# Patient Record
Sex: Female | Born: 1955 | Race: Black or African American | Hispanic: No | Marital: Married | Smoking: Never smoker
Health system: Southern US, Community
[De-identification: ages and names within clinical notes are randomized; demographics above are authoritative.]

## PROBLEM LIST (undated history)

## (undated) DIAGNOSIS — I10 Essential (primary) hypertension: Secondary | ICD-10-CM

## (undated) DIAGNOSIS — K5900 Constipation, unspecified: Secondary | ICD-10-CM

## (undated) DIAGNOSIS — K635 Polyp of colon: Secondary | ICD-10-CM

## (undated) DIAGNOSIS — K648 Other hemorrhoids: Secondary | ICD-10-CM

## (undated) DIAGNOSIS — E785 Hyperlipidemia, unspecified: Secondary | ICD-10-CM

## (undated) DIAGNOSIS — Z87898 Personal history of other specified conditions: Secondary | ICD-10-CM

## (undated) DIAGNOSIS — K644 Residual hemorrhoidal skin tags: Secondary | ICD-10-CM

## (undated) DIAGNOSIS — H269 Unspecified cataract: Secondary | ICD-10-CM

## (undated) HISTORY — DX: Residual hemorrhoidal skin tags: K64.4

## (undated) HISTORY — DX: Other hemorrhoids: K64.8

## (undated) HISTORY — DX: Polyp of colon: K63.5

## (undated) HISTORY — DX: Morbid (severe) obesity due to excess calories: E66.01

## (undated) HISTORY — DX: Essential (primary) hypertension: I10

## (undated) HISTORY — DX: Constipation, unspecified: K59.00

## (undated) HISTORY — DX: Personal history of other specified conditions: Z87.898

## (undated) HISTORY — DX: Unspecified cataract: H26.9

## (undated) HISTORY — PX: TRANSPHENOIDAL / TRANSNASAL HYPOPHYSECTOMY / RESECTION PITUITARY TUMOR: SUR1382

## (undated) HISTORY — DX: Hyperlipidemia, unspecified: E78.5

---

## 1998-02-22 ENCOUNTER — Emergency Department (HOSPITAL_COMMUNITY): Admission: EM | Admit: 1998-02-22 | Discharge: 1998-02-22 | Payer: Self-pay | Admitting: Emergency Medicine

## 1998-02-22 ENCOUNTER — Encounter: Payer: Self-pay | Admitting: *Deleted

## 1998-02-26 ENCOUNTER — Emergency Department (HOSPITAL_COMMUNITY): Admission: EM | Admit: 1998-02-26 | Discharge: 1998-02-26 | Payer: Self-pay | Admitting: Emergency Medicine

## 1998-03-01 ENCOUNTER — Emergency Department (HOSPITAL_COMMUNITY): Admission: EM | Admit: 1998-03-01 | Discharge: 1998-03-01 | Payer: Self-pay | Admitting: Emergency Medicine

## 1998-03-04 ENCOUNTER — Emergency Department (HOSPITAL_COMMUNITY): Admission: EM | Admit: 1998-03-04 | Discharge: 1998-03-04 | Payer: Self-pay | Admitting: Emergency Medicine

## 1998-03-16 ENCOUNTER — Encounter: Admission: RE | Admit: 1998-03-16 | Discharge: 1998-03-16 | Payer: Self-pay | Admitting: Internal Medicine

## 1998-05-15 ENCOUNTER — Encounter: Admission: RE | Admit: 1998-05-15 | Discharge: 1998-05-15 | Payer: Self-pay | Admitting: Internal Medicine

## 1998-05-15 ENCOUNTER — Ambulatory Visit (HOSPITAL_COMMUNITY): Admission: RE | Admit: 1998-05-15 | Discharge: 1998-05-15 | Payer: Self-pay | Admitting: Internal Medicine

## 1998-06-22 ENCOUNTER — Emergency Department (HOSPITAL_COMMUNITY): Admission: EM | Admit: 1998-06-22 | Discharge: 1998-06-22 | Payer: Self-pay | Admitting: Emergency Medicine

## 1998-06-22 ENCOUNTER — Encounter: Payer: Self-pay | Admitting: Emergency Medicine

## 1999-03-25 ENCOUNTER — Emergency Department (HOSPITAL_COMMUNITY): Admission: EM | Admit: 1999-03-25 | Discharge: 1999-03-25 | Payer: Self-pay | Admitting: Emergency Medicine

## 1999-04-17 ENCOUNTER — Emergency Department (HOSPITAL_COMMUNITY): Admission: EM | Admit: 1999-04-17 | Discharge: 1999-04-17 | Payer: Self-pay | Admitting: Emergency Medicine

## 2001-09-16 ENCOUNTER — Emergency Department (HOSPITAL_COMMUNITY): Admission: EM | Admit: 2001-09-16 | Discharge: 2001-09-16 | Payer: Self-pay | Admitting: Emergency Medicine

## 2003-04-12 ENCOUNTER — Emergency Department (HOSPITAL_COMMUNITY): Admission: EM | Admit: 2003-04-12 | Discharge: 2003-04-12 | Payer: Self-pay | Admitting: Emergency Medicine

## 2003-04-15 ENCOUNTER — Emergency Department (HOSPITAL_COMMUNITY): Admission: EM | Admit: 2003-04-15 | Discharge: 2003-04-15 | Payer: Self-pay | Admitting: Emergency Medicine

## 2004-08-30 ENCOUNTER — Emergency Department (HOSPITAL_COMMUNITY): Admission: EM | Admit: 2004-08-30 | Discharge: 2004-08-30 | Payer: Self-pay | Admitting: Emergency Medicine

## 2004-12-26 ENCOUNTER — Emergency Department (HOSPITAL_COMMUNITY): Admission: EM | Admit: 2004-12-26 | Discharge: 2004-12-26 | Payer: Self-pay | Admitting: Emergency Medicine

## 2004-12-28 ENCOUNTER — Ambulatory Visit: Payer: Self-pay | Admitting: Internal Medicine

## 2005-01-18 ENCOUNTER — Ambulatory Visit: Payer: Self-pay | Admitting: Internal Medicine

## 2005-01-29 ENCOUNTER — Ambulatory Visit: Payer: Self-pay | Admitting: Internal Medicine

## 2005-02-06 ENCOUNTER — Inpatient Hospital Stay (HOSPITAL_COMMUNITY): Admission: AD | Admit: 2005-02-06 | Discharge: 2005-02-08 | Payer: Self-pay | Admitting: Internal Medicine

## 2005-02-06 ENCOUNTER — Ambulatory Visit: Payer: Self-pay | Admitting: Internal Medicine

## 2005-02-07 ENCOUNTER — Ambulatory Visit: Payer: Self-pay | Admitting: Infectious Diseases

## 2005-02-08 ENCOUNTER — Encounter (INDEPENDENT_AMBULATORY_CARE_PROVIDER_SITE_OTHER): Payer: Self-pay | Admitting: Cardiology

## 2005-02-19 ENCOUNTER — Encounter (INDEPENDENT_AMBULATORY_CARE_PROVIDER_SITE_OTHER): Payer: Self-pay | Admitting: *Deleted

## 2005-02-19 ENCOUNTER — Ambulatory Visit (HOSPITAL_COMMUNITY): Admission: RE | Admit: 2005-02-19 | Discharge: 2005-02-19 | Payer: Self-pay | Admitting: *Deleted

## 2005-04-01 DIAGNOSIS — E119 Type 2 diabetes mellitus without complications: Secondary | ICD-10-CM

## 2005-04-01 HISTORY — DX: Type 2 diabetes mellitus without complications: E11.9

## 2005-04-02 ENCOUNTER — Ambulatory Visit: Payer: Self-pay | Admitting: Internal Medicine

## 2005-07-25 ENCOUNTER — Ambulatory Visit: Payer: Self-pay | Admitting: Internal Medicine

## 2005-07-31 ENCOUNTER — Ambulatory Visit: Payer: Self-pay | Admitting: Internal Medicine

## 2005-08-08 ENCOUNTER — Ambulatory Visit: Payer: Self-pay | Admitting: Internal Medicine

## 2005-08-14 ENCOUNTER — Ambulatory Visit: Payer: Self-pay | Admitting: Internal Medicine

## 2005-08-20 ENCOUNTER — Ambulatory Visit (HOSPITAL_COMMUNITY): Admission: RE | Admit: 2005-08-20 | Discharge: 2005-08-20 | Payer: Self-pay | Admitting: Internal Medicine

## 2005-08-23 ENCOUNTER — Ambulatory Visit: Payer: Self-pay | Admitting: Internal Medicine

## 2005-09-18 ENCOUNTER — Emergency Department (HOSPITAL_COMMUNITY): Admission: EM | Admit: 2005-09-18 | Discharge: 2005-09-18 | Payer: Self-pay | Admitting: Emergency Medicine

## 2005-12-06 ENCOUNTER — Ambulatory Visit: Payer: Self-pay | Admitting: Hospitalist

## 2006-01-14 DIAGNOSIS — E119 Type 2 diabetes mellitus without complications: Secondary | ICD-10-CM | POA: Insufficient documentation

## 2006-01-14 DIAGNOSIS — I10 Essential (primary) hypertension: Secondary | ICD-10-CM | POA: Insufficient documentation

## 2006-04-10 ENCOUNTER — Ambulatory Visit (HOSPITAL_COMMUNITY): Admission: RE | Admit: 2006-04-10 | Discharge: 2006-04-10 | Payer: Self-pay | Admitting: Internal Medicine

## 2006-04-30 ENCOUNTER — Ambulatory Visit: Payer: Self-pay | Admitting: Internal Medicine

## 2006-04-30 ENCOUNTER — Encounter (INDEPENDENT_AMBULATORY_CARE_PROVIDER_SITE_OTHER): Payer: Self-pay | Admitting: *Deleted

## 2006-04-30 LAB — CONVERTED CEMR LAB
CO2: 27 meq/L (ref 19–32)
Chloride: 104 meq/L (ref 96–112)
Creatinine, Ser: 0.7 mg/dL (ref 0.40–1.20)
Potassium: 4.1 meq/L (ref 3.5–5.3)

## 2006-05-07 ENCOUNTER — Ambulatory Visit: Payer: Self-pay | Admitting: Internal Medicine

## 2006-05-15 ENCOUNTER — Encounter (INDEPENDENT_AMBULATORY_CARE_PROVIDER_SITE_OTHER): Payer: Self-pay | Admitting: *Deleted

## 2006-05-15 ENCOUNTER — Ambulatory Visit: Payer: Self-pay | Admitting: Internal Medicine

## 2006-05-15 LAB — CONVERTED CEMR LAB
ALT: 20 units/L (ref 0–35)
AST: 20 units/L (ref 0–37)
Alkaline Phosphatase: 43 units/L (ref 39–117)
CO2: 29 meq/L (ref 19–32)
Chloride: 103 meq/L (ref 96–112)
Creatinine, Ser: 0.61 mg/dL (ref 0.40–1.20)
Glucose, Bld: 117 mg/dL — ABNORMAL HIGH (ref 70–99)
HDL: 51 mg/dL (ref >39)
LDL Cholesterol: 114 mg/dL — ABNORMAL HIGH (ref 0–99)
Potassium: 4.4 meq/L (ref 3.5–5.3)
Sodium: 140 meq/L (ref 135–145)
Total CHOL/HDL Ratio: 3.5
VLDL: 12 mg/dL (ref 0–40)

## 2006-05-16 ENCOUNTER — Encounter (INDEPENDENT_AMBULATORY_CARE_PROVIDER_SITE_OTHER): Payer: Self-pay | Admitting: *Deleted

## 2006-08-12 ENCOUNTER — Encounter (INDEPENDENT_AMBULATORY_CARE_PROVIDER_SITE_OTHER): Payer: Self-pay | Admitting: *Deleted

## 2006-08-12 ENCOUNTER — Ambulatory Visit: Payer: Self-pay | Admitting: Internal Medicine

## 2006-08-12 LAB — CONVERTED CEMR LAB
Calcium: 9.4 mg/dL (ref 8.4–10.5)
Sodium: 140 meq/L (ref 135–145)

## 2006-08-15 ENCOUNTER — Ambulatory Visit: Payer: Self-pay | Admitting: Hospitalist

## 2006-08-15 ENCOUNTER — Encounter (INDEPENDENT_AMBULATORY_CARE_PROVIDER_SITE_OTHER): Payer: Self-pay | Admitting: *Deleted

## 2006-08-15 LAB — CONVERTED CEMR LAB
CO2: 31 meq/L (ref 19–32)
Calcium: 9.3 mg/dL (ref 8.4–10.5)
Chloride: 104 meq/L (ref 96–112)
Potassium: 4 meq/L (ref 3.5–5.3)
Sodium: 141 meq/L (ref 135–145)

## 2006-12-24 ENCOUNTER — Ambulatory Visit: Payer: Self-pay | Admitting: Internal Medicine

## 2006-12-24 ENCOUNTER — Ambulatory Visit (HOSPITAL_COMMUNITY): Admission: RE | Admit: 2006-12-24 | Discharge: 2006-12-24 | Payer: Self-pay | Admitting: Internal Medicine

## 2006-12-24 ENCOUNTER — Encounter (INDEPENDENT_AMBULATORY_CARE_PROVIDER_SITE_OTHER): Payer: Self-pay | Admitting: *Deleted

## 2006-12-24 LAB — CONVERTED CEMR LAB
AST: 24 units/L (ref 0–37)
Albumin: 4.3 g/dL (ref 3.5–5.2)
Alkaline Phosphatase: 45 units/L (ref 39–117)
BUN: 10 mg/dL (ref 6–23)
Creatinine, Urine: 108.2 mg/dL
Microalb Creat Ratio: 2.9 mg/g (ref 0.0–30.0)
Microalb, Ur: 0.31 mg/dL (ref 0.00–1.89)
Potassium: 4.4 meq/L (ref 3.5–5.3)
Sodium: 144 meq/L (ref 135–145)

## 2007-01-26 ENCOUNTER — Encounter (INDEPENDENT_AMBULATORY_CARE_PROVIDER_SITE_OTHER): Payer: Self-pay | Admitting: *Deleted

## 2007-01-26 ENCOUNTER — Ambulatory Visit: Payer: Self-pay | Admitting: Hospitalist

## 2007-02-09 ENCOUNTER — Ambulatory Visit: Payer: Self-pay | Admitting: Internal Medicine

## 2007-02-09 ENCOUNTER — Encounter (INDEPENDENT_AMBULATORY_CARE_PROVIDER_SITE_OTHER): Payer: Self-pay | Admitting: *Deleted

## 2007-05-25 ENCOUNTER — Ambulatory Visit (HOSPITAL_COMMUNITY): Admission: RE | Admit: 2007-05-25 | Discharge: 2007-05-25 | Payer: Self-pay | Admitting: *Deleted

## 2007-06-13 ENCOUNTER — Emergency Department (HOSPITAL_COMMUNITY): Admission: EM | Admit: 2007-06-13 | Discharge: 2007-06-13 | Payer: Self-pay | Admitting: Emergency Medicine

## 2007-07-24 ENCOUNTER — Emergency Department (HOSPITAL_COMMUNITY): Admission: EM | Admit: 2007-07-24 | Discharge: 2007-07-24 | Payer: Self-pay | Admitting: Emergency Medicine

## 2007-10-05 ENCOUNTER — Encounter: Payer: Self-pay | Admitting: Internal Medicine

## 2007-10-05 ENCOUNTER — Ambulatory Visit: Payer: Self-pay | Admitting: *Deleted

## 2007-10-05 ENCOUNTER — Ambulatory Visit (HOSPITAL_COMMUNITY): Admission: RE | Admit: 2007-10-05 | Discharge: 2007-10-05 | Payer: Self-pay | Admitting: *Deleted

## 2007-10-05 DIAGNOSIS — E1169 Type 2 diabetes mellitus with other specified complication: Secondary | ICD-10-CM | POA: Insufficient documentation

## 2007-10-05 DIAGNOSIS — M25519 Pain in unspecified shoulder: Secondary | ICD-10-CM | POA: Insufficient documentation

## 2007-10-05 DIAGNOSIS — E785 Hyperlipidemia, unspecified: Secondary | ICD-10-CM

## 2007-10-05 LAB — CONVERTED CEMR LAB
ALT: 29 units/L (ref 0–35)
CO2: 26 meq/L (ref 19–32)
Calcium: 9.5 mg/dL (ref 8.4–10.5)
Chloride: 102 meq/L (ref 96–112)
Cholesterol: 188 mg/dL (ref 0–200)
Creatinine, Ser: 0.6 mg/dL (ref 0.40–1.20)
Hgb A1c MFr Bld: 6.8 %
Sodium: 141 meq/L (ref 135–145)
Total Protein: 7.4 g/dL (ref 6.0–8.3)

## 2007-11-12 ENCOUNTER — Ambulatory Visit: Payer: Self-pay | Admitting: *Deleted

## 2007-12-01 DIAGNOSIS — Z860100 Personal history of colon polyps, unspecified: Secondary | ICD-10-CM

## 2007-12-01 HISTORY — DX: Personal history of colonic polyps: Z86.010

## 2007-12-01 HISTORY — DX: Personal history of colon polyps, unspecified: Z86.0100

## 2007-12-14 ENCOUNTER — Ambulatory Visit: Payer: Self-pay | Admitting: Gastroenterology

## 2007-12-23 ENCOUNTER — Encounter: Payer: Self-pay | Admitting: Gastroenterology

## 2007-12-23 ENCOUNTER — Ambulatory Visit: Payer: Self-pay | Admitting: Gastroenterology

## 2007-12-23 DIAGNOSIS — K644 Residual hemorrhoidal skin tags: Secondary | ICD-10-CM

## 2007-12-23 HISTORY — PX: COLONOSCOPY: SHX174

## 2007-12-23 HISTORY — DX: Residual hemorrhoidal skin tags: K64.4

## 2007-12-24 ENCOUNTER — Encounter: Payer: Self-pay | Admitting: Gastroenterology

## 2008-04-20 ENCOUNTER — Ambulatory Visit: Payer: Self-pay | Admitting: Internal Medicine

## 2008-04-20 ENCOUNTER — Encounter (INDEPENDENT_AMBULATORY_CARE_PROVIDER_SITE_OTHER): Payer: Self-pay | Admitting: Internal Medicine

## 2008-04-20 ENCOUNTER — Inpatient Hospital Stay (HOSPITAL_COMMUNITY): Admission: AD | Admit: 2008-04-20 | Discharge: 2008-04-22 | Payer: Self-pay | Admitting: Infectious Diseases

## 2008-04-20 ENCOUNTER — Encounter (INDEPENDENT_AMBULATORY_CARE_PROVIDER_SITE_OTHER): Payer: Self-pay | Admitting: *Deleted

## 2008-04-20 ENCOUNTER — Ambulatory Visit: Payer: Self-pay | Admitting: Infectious Diseases

## 2008-04-20 LAB — CONVERTED CEMR LAB
Blood Glucose, Fingerstick: 130
Hgb A1c MFr Bld: 6.5 %

## 2008-04-21 ENCOUNTER — Encounter: Payer: Self-pay | Admitting: *Deleted

## 2008-04-21 LAB — CONVERTED CEMR LAB
Cholesterol: 160 mg/dL
HDL: 45 mg/dL
Triglycerides: 71 mg/dL

## 2008-04-22 ENCOUNTER — Encounter (INDEPENDENT_AMBULATORY_CARE_PROVIDER_SITE_OTHER): Payer: Self-pay | Admitting: Internal Medicine

## 2008-05-16 ENCOUNTER — Encounter: Payer: Self-pay | Admitting: *Deleted

## 2008-05-16 ENCOUNTER — Ambulatory Visit: Payer: Self-pay | Admitting: Internal Medicine

## 2008-05-26 ENCOUNTER — Ambulatory Visit (HOSPITAL_COMMUNITY): Admission: RE | Admit: 2008-05-26 | Discharge: 2008-05-26 | Payer: Self-pay | Admitting: *Deleted

## 2008-06-17 ENCOUNTER — Ambulatory Visit: Payer: Self-pay | Admitting: Internal Medicine

## 2008-07-14 ENCOUNTER — Ambulatory Visit: Payer: Self-pay | Admitting: Internal Medicine

## 2008-07-14 DIAGNOSIS — M79609 Pain in unspecified limb: Secondary | ICD-10-CM | POA: Insufficient documentation

## 2008-07-18 ENCOUNTER — Ambulatory Visit (HOSPITAL_COMMUNITY): Admission: RE | Admit: 2008-07-18 | Discharge: 2008-07-18 | Payer: Self-pay | Admitting: *Deleted

## 2008-07-20 ENCOUNTER — Telehealth: Payer: Self-pay | Admitting: *Deleted

## 2008-07-26 DIAGNOSIS — M1990 Unspecified osteoarthritis, unspecified site: Secondary | ICD-10-CM

## 2008-07-26 DIAGNOSIS — M1712 Unilateral primary osteoarthritis, left knee: Secondary | ICD-10-CM | POA: Insufficient documentation

## 2008-07-27 ENCOUNTER — Encounter (INDEPENDENT_AMBULATORY_CARE_PROVIDER_SITE_OTHER): Payer: Self-pay | Admitting: *Deleted

## 2008-08-01 ENCOUNTER — Ambulatory Visit: Payer: Self-pay | Admitting: Family Medicine

## 2008-11-23 ENCOUNTER — Encounter: Payer: Self-pay | Admitting: Internal Medicine

## 2009-03-15 ENCOUNTER — Emergency Department (HOSPITAL_COMMUNITY): Admission: EM | Admit: 2009-03-15 | Discharge: 2009-03-15 | Payer: Self-pay | Admitting: Emergency Medicine

## 2009-04-03 ENCOUNTER — Emergency Department (HOSPITAL_COMMUNITY): Admission: EM | Admit: 2009-04-03 | Discharge: 2009-04-03 | Payer: Self-pay | Admitting: Emergency Medicine

## 2009-04-14 ENCOUNTER — Ambulatory Visit: Payer: Self-pay | Admitting: Internal Medicine

## 2009-04-14 ENCOUNTER — Telehealth: Payer: Self-pay | Admitting: *Deleted

## 2009-04-14 DIAGNOSIS — R519 Headache, unspecified: Secondary | ICD-10-CM | POA: Insufficient documentation

## 2009-04-14 DIAGNOSIS — R7401 Elevation of levels of liver transaminase levels: Secondary | ICD-10-CM | POA: Insufficient documentation

## 2009-04-14 DIAGNOSIS — R51 Headache: Secondary | ICD-10-CM | POA: Insufficient documentation

## 2009-04-14 DIAGNOSIS — R740 Nonspecific elevation of levels of transaminase and lactic acid dehydrogenase [LDH]: Secondary | ICD-10-CM

## 2009-04-14 LAB — CONVERTED CEMR LAB
ALT: 20 units/L (ref 0–35)
AST: 22 units/L (ref 0–37)
Blood Glucose, Fingerstick: 110
Cholesterol: 180 mg/dL (ref 0–200)
Creatinine, Ser: 0.62 mg/dL (ref 0.40–1.20)
Creatinine, Urine: 61.6 mg/dL
Hgb A1c MFr Bld: 6.4 %
Microalb Creat Ratio: 8.1 mg/g (ref 0.0–30.0)
Total Bilirubin: 0.4 mg/dL (ref 0.3–1.2)
Total CHOL/HDL Ratio: 3.2
VLDL: 19 mg/dL (ref 0–40)

## 2009-04-15 ENCOUNTER — Encounter (INDEPENDENT_AMBULATORY_CARE_PROVIDER_SITE_OTHER): Payer: Self-pay | Admitting: Dermatology

## 2009-04-16 LAB — CONVERTED CEMR LAB: Prolactin: 4.6 ng/mL

## 2009-04-20 ENCOUNTER — Encounter (INDEPENDENT_AMBULATORY_CARE_PROVIDER_SITE_OTHER): Payer: Self-pay | Admitting: Internal Medicine

## 2009-04-26 ENCOUNTER — Ambulatory Visit: Payer: Self-pay | Admitting: Internal Medicine

## 2009-05-08 ENCOUNTER — Encounter (INDEPENDENT_AMBULATORY_CARE_PROVIDER_SITE_OTHER): Payer: Self-pay | Admitting: Internal Medicine

## 2009-05-16 ENCOUNTER — Telehealth: Payer: Self-pay | Admitting: *Deleted

## 2009-05-29 ENCOUNTER — Ambulatory Visit (HOSPITAL_COMMUNITY): Admission: RE | Admit: 2009-05-29 | Discharge: 2009-05-29 | Payer: Self-pay | Admitting: Dermatology

## 2009-06-27 ENCOUNTER — Telehealth (INDEPENDENT_AMBULATORY_CARE_PROVIDER_SITE_OTHER): Payer: Self-pay | Admitting: Dermatology

## 2009-07-18 ENCOUNTER — Ambulatory Visit: Payer: Self-pay | Admitting: Internal Medicine

## 2009-07-18 DIAGNOSIS — F329 Major depressive disorder, single episode, unspecified: Secondary | ICD-10-CM | POA: Insufficient documentation

## 2009-07-18 DIAGNOSIS — F3289 Other specified depressive episodes: Secondary | ICD-10-CM | POA: Insufficient documentation

## 2009-07-18 LAB — CONVERTED CEMR LAB: Hgb A1c MFr Bld: 6.3 %

## 2009-07-24 ENCOUNTER — Ambulatory Visit: Payer: Self-pay | Admitting: Sports Medicine

## 2009-08-08 ENCOUNTER — Encounter (INDEPENDENT_AMBULATORY_CARE_PROVIDER_SITE_OTHER): Payer: Self-pay | Admitting: Internal Medicine

## 2009-10-09 ENCOUNTER — Ambulatory Visit: Payer: Self-pay | Admitting: Internal Medicine

## 2009-10-09 DIAGNOSIS — R350 Frequency of micturition: Secondary | ICD-10-CM | POA: Insufficient documentation

## 2009-10-09 LAB — CONVERTED CEMR LAB
Alkaline Phosphatase: 46 units/L (ref 39–117)
BUN: 11 mg/dL (ref 6–23)
Bacteria, UA: NONE SEEN
Barbiturate Quant, Ur: NEGATIVE
Bilirubin Urine: NEGATIVE
Blood in Urine, dipstick: NEGATIVE
CO2: 29 meq/L (ref 19–32)
Cholesterol: 148 mg/dL (ref 0–200)
Cocaine Metabolites: NEGATIVE
Creatinine, Ser: 0.84 mg/dL (ref 0.40–1.20)
Creatinine,U: 146.5 mg/dL
Crystals: NONE SEEN
Eosinophils Absolute: 0.1 10*3/uL (ref 0.0–0.7)
Eosinophils Relative: 1 % (ref 0–5)
Glucose, Bld: 112 mg/dL — ABNORMAL HIGH (ref 70–99)
Glucose, Urine, Semiquant: NEGATIVE
HCT: 42.4 % (ref 36.0–46.0)
HDL: 54 mg/dL (ref >39)
Hemoglobin: 13.5 g/dL (ref 12.0–15.0)
Ketones, ur: NEGATIVE mg/dL
Ketones, urine, test strip: NEGATIVE
LDL Cholesterol: 75 mg/dL (ref 0–99)
Lymphs Abs: 3.2 10*3/uL (ref 0.7–4.0)
MCV: 94.2 fL (ref >=78.0)
Monocytes Absolute: 0.6 10*3/uL (ref 0.1–1.0)
Monocytes Relative: 6 % (ref 3–12)
Opiates: NEGATIVE
Phencyclidine (PCP): NEGATIVE
Platelets: 320 10*3/uL (ref 150–400)
Propoxyphene: NEGATIVE
Protein, U semiquant: NEGATIVE
Protein, ur: NEGATIVE mg/dL
RBC / HPF: NONE SEEN (ref <3)
Squamous Epithelial / LPF: NONE SEEN /lpf
Total Bilirubin: 0.3 mg/dL (ref 0.3–1.2)
Triglycerides: 94 mg/dL (ref <150)
Urine Glucose: NEGATIVE mg/dL
Urobilinogen, UA: 0.2 (ref 0.0–1.0)
VLDL: 19 mg/dL (ref 0–40)
WBC, UA: NONE SEEN cells/hpf (ref <3)
WBC: 10.1 10*3/uL (ref 4.0–10.5)

## 2009-11-20 ENCOUNTER — Encounter: Payer: Self-pay | Admitting: Internal Medicine

## 2009-11-20 ENCOUNTER — Ambulatory Visit: Payer: Self-pay | Admitting: Internal Medicine

## 2009-11-20 LAB — CONVERTED CEMR LAB: Blood Glucose, Fingerstick: 132

## 2009-11-21 ENCOUNTER — Ambulatory Visit (HOSPITAL_COMMUNITY): Admission: RE | Admit: 2009-11-21 | Discharge: 2009-11-21 | Payer: Self-pay | Admitting: Internal Medicine

## 2009-11-24 ENCOUNTER — Ambulatory Visit: Payer: Self-pay | Admitting: Family Medicine

## 2009-12-22 ENCOUNTER — Encounter: Payer: Self-pay | Admitting: Internal Medicine

## 2009-12-22 ENCOUNTER — Observation Stay (HOSPITAL_COMMUNITY): Admission: EM | Admit: 2009-12-22 | Discharge: 2009-12-25 | Payer: Self-pay | Admitting: Emergency Medicine

## 2009-12-22 ENCOUNTER — Ambulatory Visit: Payer: Self-pay | Admitting: Internal Medicine

## 2009-12-25 ENCOUNTER — Encounter: Payer: Self-pay | Admitting: Internal Medicine

## 2009-12-25 DIAGNOSIS — J3489 Other specified disorders of nose and nasal sinuses: Secondary | ICD-10-CM | POA: Insufficient documentation

## 2009-12-26 ENCOUNTER — Emergency Department (HOSPITAL_COMMUNITY): Admission: EM | Admit: 2009-12-26 | Discharge: 2009-12-27 | Payer: Self-pay | Admitting: Emergency Medicine

## 2009-12-29 ENCOUNTER — Emergency Department (HOSPITAL_COMMUNITY): Admission: EM | Admit: 2009-12-29 | Discharge: 2009-12-29 | Payer: Self-pay | Admitting: Emergency Medicine

## 2010-03-07 ENCOUNTER — Ambulatory Visit: Payer: Self-pay | Admitting: Internal Medicine

## 2010-04-10 ENCOUNTER — Emergency Department (HOSPITAL_COMMUNITY)
Admission: EM | Admit: 2010-04-10 | Discharge: 2010-04-10 | Payer: Self-pay | Source: Home / Self Care | Admitting: Emergency Medicine

## 2010-04-12 ENCOUNTER — Ambulatory Visit
Admission: RE | Admit: 2010-04-12 | Discharge: 2010-04-12 | Payer: Self-pay | Source: Home / Self Care | Attending: Internal Medicine | Admitting: Internal Medicine

## 2010-04-12 ENCOUNTER — Telehealth: Payer: Self-pay | Admitting: *Deleted

## 2010-04-12 DIAGNOSIS — J069 Acute upper respiratory infection, unspecified: Secondary | ICD-10-CM | POA: Insufficient documentation

## 2010-04-12 LAB — CONVERTED CEMR LAB: Blood Glucose, Fingerstick: 169

## 2010-04-16 LAB — GLUCOSE, CAPILLARY: Glucose-Capillary: 169 mg/dL — ABNORMAL HIGH (ref 70–99)

## 2010-04-30 ENCOUNTER — Ambulatory Visit: Admission: RE | Admit: 2010-04-30 | Discharge: 2010-04-30 | Payer: Self-pay | Source: Home / Self Care

## 2010-04-30 ENCOUNTER — Other Ambulatory Visit: Payer: Self-pay | Admitting: Internal Medicine

## 2010-04-30 DIAGNOSIS — R1904 Left lower quadrant abdominal swelling, mass and lump: Secondary | ICD-10-CM | POA: Insufficient documentation

## 2010-04-30 LAB — CONVERTED CEMR LAB
Blood Glucose, Fingerstick: 170
Calcium: 10.1 mg/dL (ref 8.4–10.5)
Cholesterol, target level: 200 mg/dL
HDL goal, serum: 40 mg/dL
Potassium: 4.1 meq/L (ref 3.5–5.3)
Sodium: 140 meq/L (ref 135–145)

## 2010-04-30 LAB — GLUCOSE, CAPILLARY: Glucose-Capillary: 170 mg/dL — ABNORMAL HIGH (ref 70–99)

## 2010-05-01 NOTE — Miscellaneous (Signed)
Summary: [facility]  [facility]   Imported By: Florinda Marker 05/09/2009 13:56:52  _____________________________________________________________________  External Attachment:    Type:   Image     Comment:   External Document

## 2010-05-01 NOTE — Miscellaneous (Signed)
Summary: MEDICATION CONTRACT  MEDICATION CONTRACT   Imported By: Louretta Parma 11/24/2009 15:03:32  _____________________________________________________________________  External Attachment:    Type:   Image     Comment:   External Document

## 2010-05-01 NOTE — Progress Notes (Signed)
  Phone Note Outgoing Call   Call placed by: Theotis Barrio NT II,  May 16, 2009 12:08 PM Call placed to: Patient Details for Reason: MAMMOGRAM APPT Summary of Call: [facility]  / FEB.  28, 011 @ 11:20AM (11:00AM) . SPOKE WITH MS [place].Lela Sturdivant NT II  May 16, 2009 12:10 PM

## 2010-05-01 NOTE — Assessment & Plan Note (Signed)
Summary: L KNEE PAIN, ANKLE PAIN,MC   Vital Signs:  Patient profile:   55 year old female BP sitting:   144 / 104  Vitals Entered By: Lillia Pauls CMA (November 24, 2009 9:54 AM)  Primary Care Provider:  Almyra Deforest MD   History of Present Illness: f/u left knee and left ankle pain Injection in her knee helped for only 2 weeks. has had pain since that is unchanged. (Injection in April). No new symptoms, no new injury. Constinues with chronic left ankle pain and swelling as well. Painful to do much walking.  Current Medications (verified): 1)  Glucophage 500 Mg  Tabs (Metformin Hcl) .... Take 1 Tablet By Mouth Two Times A Day 2)  Addaprin 200 Mg  Tabs (Ibuprofen) .... Take One Tablet 3-4 Times Per Day As Needed For Pain 3)  Simvastatin 40 Mg Tabs (Simvastatin) .... Take 1 Tab By Mouth At Bedtime 4)  Lopressor 50 Mg Tabs (Metoprolol Tartrate) .... Take 1 Tablet By Mouth Two Times A Day 5)  Hydrocodone-Acetaminophen 5-500 Mg Tabs (Hydrocodone-Acetaminophen) .... Take 1 Tablet By Mouth Every 6 Hours As Needed For Pain 6)  Accuretic 20-25 Mg Tabs (Quinapril-Hydrochlorothiazide) .... Take 1 Tablet By Mouth Once A Day 7)  Aspirin 81 Mg Tbec (Aspirin) .... Take 1 Tablet By Mouth Once A Day 8)  Colace 100 Mg Caps (Docusate Sodium) .... Take One Tablet By Mouth Once A Day Prn 9)  Anusol-Hc 2.5 % Crea (Hydrocortisone) .Marland Kitchen.. 1 Applicator Full Pr Bid For 1 Week and Then Stop  Allergies: No Known Drug Allergies  Review of Systems  The patient denies fever.    Physical Exam  General:  alert, well-developed, well-nourished, well-hydrated, and overweight-appearing.   Msk:  Left knee some synovial changes c/w OA. Mild crepitus on extension. Full extension and flexion. Ligamentously intact. No effusion. Patella mild lateral traking  L ankle--mild non pitting soft  tissue swelling esp over lateral ATF area. FROm.   GAIT somewhat antalgic   Impression & Recommendations:  Problem # 1:  KNEE  PAIN (ICD-719.46) injection therapy did not help[ her we discussed weight loss---not really  ready to pursue HEP. will f/u as needed--as injection did not help, I do not think we have much else to offer her for her knee pain. She is a bot young to consider TKR and her last x rays showed some joint space retained. A good over all rehab p[rogram might be beneficial, but she does not seem ready to undertake that.  Problem # 2:  FOOT PAIN, LEFT (ICD-729.5) foot and ankle pain see above re rehab might benefit from mild - moderate support hose to keep her ST swelling minimized and s we discussed  Complete Medication List: 1)  Glucophage 500 Mg Tabs (Metformin hcl) .... Take 1 tablet by mouth two times a day 2)  Addaprin 200 Mg Tabs (Ibuprofen) .... Take one tablet 3-4 times per day as needed for pain 3)  Simvastatin 40 Mg Tabs (Simvastatin) .... Take 1 tab by mouth at bedtime 4)  Lopressor 50 Mg Tabs (Metoprolol tartrate) .... Take 1 tablet by mouth two times a day 5)  Hydrocodone-acetaminophen 5-500 Mg Tabs (Hydrocodone-acetaminophen) .... Take 1 tablet by mouth every 6 hours as needed for pain 6)  Accuretic 20-25 Mg Tabs (Quinapril-hydrochlorothiazide) .... Take 1 tablet by mouth once a day 7)  Aspirin 81 Mg Tbec (Aspirin) .... Take 1 tablet by mouth once a day 8)  Colace 100 Mg Caps (Docusate sodium) .... Take  one tablet by mouth once a day prn 9)  Anusol-hc 2.5 % Crea (Hydrocortisone) .Marland Kitchen.. 1 applicator full pr bid for 1 week and then stop

## 2010-05-01 NOTE — Assessment & Plan Note (Signed)
Summary: RECHECK BP. SB.   Vital Signs:  Patient profile:   55 year old female Height:      62 inches (157.48 cm) Weight:      275.8 pounds (125.36 kg) BMI:     50.63 Pulse rate:   73 / minute BP sitting:   134 / 87  (right arm) Cuff size:   large  Vitals Entered By: Krystal Eaton Duncan Dull) (April 26, 2009 9:13 AM) CC: 2wk f/u bp ck (has not had meds this am) amlodipine was increased to 10mg  daily on 04/12/09, but pt has not picked up rx Is Patient Diabetic? Yes Did you bring your meter with you today? does not have one Pain Assessment Patient in pain? yes     Location: dental Intensity: 10 Type: sharp Onset of pain  Intermittent x Nutritional Status BMI of > 30 = obese  Have you ever been in a relationship where you felt threatened, hurt or afraid?No   Does patient need assistance? Functional Status Self care Ambulation Normal   Primary Care Provider:  Nilda Riggs MD  CC:  2wk f/u bp ck (has not had meds this am) amlodipine was increased to 10mg  daily on 04/12/09 and but pt has not picked up rx.  History of Present Illness: Pt is a 55 yo AAF with PMH of DM, HTN, HLD, Obesity and OA who came here for regular BP F/u. She has no c/o including CP, SOB, fever except both knee pain and foot pain which is f/u by Sports med Dr. Jennette Kettle. She has been taking all her meds, but not take them this morning. Her CBG runs well and has fair appetite, no diarrhea or melena No dysuria. Denies smoking, ETOH or drug abuse.    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 Take 1 Tablet By Mouth Two Times A Day 2)  Lisinopril 40 Mg  Tabs (Lisinopril) .... Take 1 Tablet By Mouth Once A Day 3)  Addaprin 200 Mg  Tabs (Ibuprofen) .... Take One Tablet 3-4 Times Per Day As Needed For Pain 4)  Simvastatin 40 Mg Tabs (Simvastatin) .... Take 1 Tab By Mouth At Bedtime 5)  Onetouch Test   Strp (Glucose Blood) .... Use To Check Your Blood Sugar Once Daily. 6)  Lopressor 50 Mg Tabs (Metoprolol Tartrate) .... Take 1 Tablet By Mouth Two Times A Day 7)  Amlodipine Besylate 10 Mg Tabs (Amlodipine Besylate) .... Take 1 Tablet By Mouth Once A Day  Current Medications (verified): 1)  Glucophage 500 Mg  Tabs (Metformin Hcl) .... Take 1 Tablet By Mouth Two Times A Day 2)  Lisinopril 40 Mg  Tabs (Lisinopril) .... Take 1 Tablet By Mouth Once A Day 3)  Addaprin 200 Mg  Tabs (Ibuprofen) .Marland KitchenMarland KitchenMarland Kitchen  Take One Tablet 3-4 Times Per Day As Needed For Pain 4)  Simvastatin 40 Mg Tabs (Simvastatin) .... Take 1 Tab By Mouth At Bedtime 5)  Onetouch Test   Strp (Glucose Blood) .... Use To Check Your Blood Sugar Once Daily. 6)  Lopressor 50 Mg Tabs (Metoprolol Tartrate) .... Take 1 Tablet By Mouth Two Times A Day 7)  Amlodipine Besylate 10 Mg Tabs (Amlodipine Besylate) .... Take 1 Tablet By Mouth Once A Day  Allergies (verified): No Known Drug Allergies  Past History:  Past Medical History: Last updated: 07/14/2008 DIABETES MELLITUS, TYPE II HYPERTENSION HYPERLIPIDEMIA OBESITY FATTY LIVER DISEASE L KNEE CPPD with patellar/quad tendon enthesopathy    -  as evidenced by xray 9/08    - attempted joint space aspiration/steroid injection unsuccessful by Dr. Laveda Abbe 01/2007 COLON  HYPERPLASTIC POLYP, 12/24/07 (Dr. Christella Hartigan) NON CARDIAC CHEST PAIN, 04/2008    - Normal cath, EF 60%  Past Surgical History: Last updated: 07/14/2008 s/p pituitary tumor resection  Family History: Last updated: 04/20/2008 Mother: stroke at 16 Father: heart attack at 109 Has an aunt that died of colon CA (late 50's-early 6's).  Sister had MI at 47.  Social History: Last updated: 07/14/2008 Never Smoked. Lives at home with son, currently unemployed. Is separated from husband - still sexually active with him.  Risk Factors: Alcohol Use: 0 (04/26/2009) Exercise: yes (04/14/2009)  Risk Factors: Smoking Status: never (04/26/2009)  Family History: Reviewed history from 04/20/2008 and no changes required. Mother: stroke at 29 Father: heart attack at 19 Has an aunt that died of colon CA (late 50's-early 67's).  Sister had MI at 64.  Social History: Reviewed history from 07/14/2008 and no changes required. Never Smoked. Lives at home with son, currently unemployed. Is separated from husband - still sexually active with him.  Review of Systems  The patient denies fever, chest pain, syncope, dyspnea on exertion, peripheral edema, prolonged cough, hemoptysis, abdominal pain, melena, and transient blindness.    Physical Exam  General:  alert, well-developed, well-nourished, well-hydrated, and overweight-appearing.   Head:  normocephalic.   Eyes:  vision grossly intact.   Ears:  no external deformities.   Nose:  no external erythema and no nasal discharge.   Mouth:  pharynx pink and moist.   Neck:  supple.   Lungs:  normal respiratory effort, normal breath sounds, no crackles, and no wheezes.   Heart:  normal rate, regular rhythm, no murmur, no gallop, no rub, and no JVD.   Abdomen:  soft, non-tender, normal bowel sounds, no distention, no masses, and no guarding.   Msk:  Both knee has very mild tenderness, no joint swelling, no joint warmth, and no redness over joints.   Pulses:   2+ Extremities:  No edema. Neurologic:  alert & oriented X3, cranial nerves II-XII intact, strength normal in all extremities, sensation intact to pinprick, and gait normal.     Impression & Recommendations:  Problem # 1:  DIABETES MELLITUS, TYPE II (ICD-250.00) Assessment Unchanged A1C at the goal and will continue the current meds and dose. Will have foot exam which is due.  Her updated medication list for this problem includes:    Glucophage 500 Mg Tabs (Metformin hcl) .Marland Kitchen... Take 1 tablet by mouth two times a day    Lisinopril 40 Mg Tabs (Lisinopril) .Marland Kitchen... Take 1 tablet by mouth once a day  Labs Reviewed: Creat: 0.62 (04/14/2009)     Last Eye Exam: No diabetic retinopathy.   Visual acuity OD (best corrected):  20/50 Visual acuity OS (best corrected): 20/30  (07/27/2008) Reviewed HgBA1c results: 6.4 (04/14/2009)  6.9 (11/23/2008)  Problem # 2:  HYPERTENSION (ICD-401.9) Assessment: Improved Her BP is much better even she did not take today's med. She has been taking all her meds as instructed, so will not change her meds. Will f/u at next visit in 3-4 months.  Her updated medication list for this problem includes:    Lisinopril 40 Mg Tabs (Lisinopril) .Marland Kitchen... Take 1 tablet by mouth once a day    Lopressor 50 Mg Tabs (Metoprolol tartrate) .Marland Kitchen... Take 1 tablet by mouth two times a day    Amlodipine Besylate 10 Mg Tabs (Amlodipine besylate) .Marland Kitchen... Take 1 tablet by mouth once a day  BP today: 134/87 Prior BP: 146/101 (04/14/2009)  Labs Reviewed: K+: 4.7 (04/14/2009) Creat: : 0.62 (04/14/2009)   Chol: 180 (04/14/2009)   HDL: 56 (04/14/2009)   LDL: 105 (04/14/2009)   TG: 95 (04/14/2009)  Problem # 3:  HYPERLIPIDEMIA (ICD-272.4) Assessment: Unchanged Her LDL is almost at the goal and advised lose weight, exercise and healthy diet. If not improve, may increase pravastatin to 80 mg.  Her updated medication list for this problem includes:    Simvastatin 40 Mg Tabs (Simvastatin) .Marland Kitchen...  Take 1 tab by mouth at bedtime  Labs Reviewed: SGOT: 22 (04/14/2009)   SGPT: 20 (04/14/2009)   HDL:56 (04/14/2009), 45 (04/21/2008)  LDL:105 (04/14/2009), 101 (04/21/2008)  Chol:180 (04/14/2009), 160 (04/21/2008)  Trig:95 (04/14/2009), 71 (04/21/2008)  Problem # 4:  OSTEOARTHRITIS (ICD-715.90) Assessment: Unchanged She still has pain in her knee and responds to ibuprofen and has been f/u by sports med Dr. Jennette Kettle. Her updated medication list for this problem includes:    Addaprin 200 Mg Tabs (Ibuprofen) .Marland Kitchen... Take one tablet 3-4 times per day as needed for pain  Problem # 5:  OBESITY NOS (ICD-278.00) Assessment: Unchanged She is morbid obesity and advised weight loss, exercise and healthy diet. She said she will try her best to do it. Ht: 62 (04/26/2009)   Wt: 275.8 (04/26/2009)   BMI: 50.63 (04/26/2009)  Complete Medication List: 1)  Glucophage 500 Mg Tabs (Metformin hcl) .... Take 1 tablet by mouth two times a day 2)  Lisinopril 40 Mg Tabs (Lisinopril) .... Take 1 tablet by mouth once a day 3)  Addaprin 200 Mg Tabs (Ibuprofen) .... Take one tablet 3-4 times per day as needed for pain 4)  Simvastatin 40 Mg Tabs (Simvastatin) .... Take 1 tab by mouth at bedtime 5)  Onetouch Test Strp (Glucose blood) .... Use to check your blood sugar once daily. 6)  Lopressor 50 Mg Tabs (Metoprolol tartrate) .... Take 1 tablet by mouth two times a day 7)  Amlodipine Besylate 10 Mg Tabs (Amlodipine besylate) .... Take 1 tablet by mouth once a day  Patient Instructions: 1)  Please schedule a follow-up appointment in 3-4 months. 2)  You need to lose weight. Consider a lower calorie diet and regular exercise.    Prevention & Chronic Care Immunizations   Influenza vaccine: Not documented   Influenza vaccine deferral: Refused  (04/14/2009)    Tetanus booster: Not documented   Td booster deferral: Refused  (04/14/2009)    Pneumococcal vaccine: Not documented   Pneumococcal vaccine deferral: Refused   (04/14/2009)  Colorectal Screening   Hemoccult: Not documented   Hemoccult action/deferral: Deferred  (04/14/2009)    Colonoscopy: Location:  [facility].    (12/23/2007)   Colonoscopy action/deferral: patient refused  (05/07/2006)   Colonoscopy  due: 11/2017  Other Screening   Pap smear: Not documented   Pap smear action/deferral: Refused  (04/26/2009)    Mammogram: Not documented   Mammogram action/deferral: Ordered  (04/14/2009)   Smoking status: never  (04/26/2009)  Diabetes Mellitus   HgbA1C: 6.4  (04/14/2009)    Eye exam: No diabetic retinopathy.   Visual acuity OD (best corrected): 20/50 Visual acuity OS (best corrected): 20/30   (07/27/2008)    Foot exam: Not documented   Foot exam action/deferral: Do today   High risk foot: Not documented   Foot care education: Done  (11/23/2008)    Urine microalbumin/creatinine ratio: 8.1  (04/14/2009)    Diabetes flowsheet reviewed?: Yes   Progress toward A1C goal: At goal  Lipids   Total Cholesterol: 180  (04/14/2009)   LDL: 105  (04/14/2009)   LDL Direct: Not documented   HDL: 56  (04/14/2009)   Triglycerides: 95  (04/14/2009)    SGOT (AST): 22  (04/14/2009)   SGPT (ALT): 20  (04/14/2009)   Alkaline phosphatase: 42  (04/14/2009)   Total bilirubin: 0.4  (04/14/2009)    Lipid flowsheet reviewed?: Yes   Progress toward LDL goal: At goal  Hypertension   Last Blood Pressure: 134 / 87  (04/26/2009)   Serum creatinine: 0.62  (04/14/2009)   Serum potassium 4.7  (04/14/2009)    Hypertension flowsheet reviewed?: Yes   Progress toward BP goal: At goal  Self-Management Support :   Personal Goals (by the next clinic visit) :     Personal A1C goal: 7  (04/26/2009)     Personal blood pressure goal: 130/80  (04/26/2009)     Personal LDL goal: 100  (04/26/2009)    Patient will work on the following items until the next clinic visit to reach self-care goals:     Medications and monitoring: take my medicines  every day, check my blood sugar, examine my feet every day  (04/26/2009)     Eating: drink diet soda or water instead of juice or soda, eat more vegetables, eat foods that are low in salt  (04/26/2009)     Activity: take a 30 minute walk every day  (04/14/2009)     Other: walk to mail box or store once a week  (11/23/2008)    Diabetes self-management support: Written self-care plan  (04/26/2009)   Diabetes care plan printed    Hypertension self-management support: Written self-care plan  (04/26/2009)   Hypertension self-care plan printed.    Lipid self-management support: Written self-care plan  (04/26/2009)   Lipid self-care plan printed.

## 2010-05-01 NOTE — Assessment & Plan Note (Signed)
Summary: L FOOT AND L KNEE PAIN X 1-3 YEARS   Vital Signs:  Patient profile:   55 year old female Weight:      283 pounds BP sitting:   122 / 72  Vitals Entered By: Lillia Pauls CMA (July 24, 2009 2:22 PM)  Primary Care Provider:  Nilda Riggs MD   History of Present Illness: Left knee pain--worse with any activity. deep ache. + popping but no giving way or locking. Pain can be 8/10 at times. better w rest. no specific injury. pain has been insiduous over 3 y  left foot still hurts. she does not recall [place] giving her temp orthotic. pain worse w walking or standing. bottom of foot and lateral side of foot.  Allergies: No Known Drug Allergies  Physical Exam  Additional Exam:  Patient given informed consent for injection. Discussed possible complications of infection, bleeding or skin atrophy at site of injection. Possible side effect of avascular necrosis (focal area of bone death) due to steroid use.Appropriate verbal time out taken Are cleaned and prepped in usual sterile fashion. A ----1 cc kennalog plus ----2cc 1% lidocaine without epinephrine was injected into the--left knee joint using anterior approach-. Patient tolerated procedure well with no complications.    Impression & Recommendations:  Problem # 1:  OSTEOARTHRITIS (ICD-715.90)  l knee inj reviewed films of knee which show medial joint space loss, DJD. we tried injection therapy today f/u 3 m for this  Her updated medication list for this problem includes:    Addaprin 200 Mg Tabs (Ibuprofen) .Marland Kitchen... Take one tablet 3-4 times per day as needed for pain    Percocet 7.5-500 Mg Tabs (Oxycodone-acetaminophen) .Marland Kitchen... Take 1 tablet every 4-6 hours as needed for pain  Problem # 2:  PES PLANUS, CONGENITAL (ICD-754.61) temp orthotic w blue padding in place of MT pad rtc 1 m for f/u this. would try to advance to full MT pad  Complete Medication List: 1)  Glucophage 500 Mg Tabs (Metformin hcl) .... Take 1 tablet by mouth  two times a day 2)  Lisinopril 40 Mg Tabs (Lisinopril) .... Take 1 tablet by mouth once a day 3)  Addaprin 200 Mg Tabs (Ibuprofen) .... Take one tablet 3-4 times per day as needed for pain 4)  Simvastatin 40 Mg Tabs (Simvastatin) .... Take 1 tab by mouth at bedtime 5)  Onetouch Test Strp (Glucose blood) .... Use to check your blood sugar once daily. 6)  Lopressor 50 Mg Tabs (Metoprolol tartrate) .... Take 1 tablet by mouth two times a day 7)  Amlodipine Besylate 10 Mg Tabs (Amlodipine besylate) .... Take 1 tablet by mouth once a day 8)  Percocet 7.5-500 Mg Tabs (Oxycodone-acetaminophen) .... Take 1 tablet every 4-6 hours as needed for pain 9)  Zoloft 100 Mg Tabs (Sertraline hcl) .... Take 1 tablet by mouth once a day  Other Orders: Joint Aspirate / Injection, Large (20610)

## 2010-05-01 NOTE — Discharge Summary (Signed)
Summary: Hospital Discharge Update    Hospital Discharge Update:  Date of Admission: 12/22/2009 Date of Discharge: 12/25/2009  Brief Summary:  Pt is a 55 y/o F admitted for headaches who was found to have unspecified mass on head CT. 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  Her HA has been well controlled and will be discharge to home and continue to f/u at outpatient.  Other follow-up issues:  She will have an appointment with Dr. Loistine Chance on 01/15/2010 at 2:30 pm. Please follow-up her headache and BP.   Also, there is some concern that the patient may be depressed - she did not want to start medication during this admission, however it may be prudent to follow-up on this as an outpatient.  Some of her depressive symptoms may be related to the acute concern regarding her headaches and concern that her tumor may have recurred; some may also be 2/2 financial difficulties.  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)  Medication list changes:  Rx of LOPRESSOR 50 MG TABS (METOPROLOL TARTRATE) Take 1 tablet by mouth two times a day;  #30 x 4;  Signed;  Entered by: Jackson Latino MD;  Authorized by: Danelle Berry, MD;  Method used: Electronically to [facility] [address] [phone]*, [address], [place], [place], [place]  [id number], Ph: [phone] or [phone], Fax: [phone] Rx of ACCURETIC 20-25 MG TABS (QUINAPRIL-HYDROCHLOROTHIAZIDE) Take 1 tablet by mouth once a day;  #30 x 3;  Signed;  Entered by: Jackson Latino MD;  Authorized by: Danelle Berry, MD;  Method used: Electronically to [facility]  [facility] [address] [phone]*, [address], [place], [place], [place]  [id number], Ph: [phone] or [phone], Fax: [phone] Rx of ASPIRIN 81 MG TBEC (ASPIRIN) Take 1 tablet by mouth once a day;  #30 x 3;  Signed;  Entered by: Jackson Latino MD;  Authorized  by: Danelle Berry, MD;  Method used: Electronically to [facility]  [facility] [address] [id number]*, [address] [place], [place], [place]  [id number], Ph: [phone] or [phone], Fax: [phone] Rx of ADDAPRIN 200 MG  TABS (IBUPROFEN) take one tablet 3-4 times per day as needed for pain;  #60 x 3;  Signed;  Entered by: Jackson Latino MD;  Authorized by: Danelle Berry, MD;  Method used: Electronically to [facility]  [facility] [address] [phone]*, [address], [place], [place], [place]  [id number], Ph: [phone] or [phone], Fax: [phone]  The medication, problem, and allergy lists have been updated.  Please see the dictated discharge summary for details.  Discharge medications:  GLUCOPHAGE 500 MG  TABS (METFORMIN HCL) Take 1 tablet by mouth two times a day ADDAPRIN 200 MG  TABS (IBUPROFEN) take one tablet 3-4 times per day as needed for pain SIMVASTATIN 40 MG TABS (SIMVASTATIN) Take 1 tab by mouth at bedtime LOPRESSOR 50 MG TABS (METOPROLOL TARTRATE) Take 1 tablet by mouth two times a day HYDROCODONE-ACETAMINOPHEN 5-500 MG TABS (HYDROCODONE-ACETAMINOPHEN) Take 1 tablet by mouth every 6 hours as needed for pain ACCURETIC 20-25 MG TABS (QUINAPRIL-HYDROCHLOROTHIAZIDE) Take 1 tablet by mouth once a day ASPIRIN 81 MG TBEC (ASPIRIN) Take 1 tablet by mouth once a day COLACE 100 MG CAPS (DOCUSATE SODIUM) Take one tablet by mouth once a day prn ANUSOL-HC 2.5 % CREA (HYDROCORTISONE) 1 applicator full PR BID for 1 week and then stop TUCKS 50 % PADS (WITCH HAZEL)  use as needed in the affected area  Other patient instructions:  You will have an appointment with Dr. Loistine Chance on 01/15/2010 at 2:30 pm.  Please take your medicines as directed. Please resume your normal activities and diet.  Note: Hospital Discharge Medications & Other Instructions handout was printed, one copy for patient and a second copy to be placed in hospital chart.

## 2010-05-01 NOTE — Miscellaneous (Addendum)
Summary: [facility]  [facility]   Imported By: Margie Billet 09/06/2009 14:54:44  _____________________________________________________________________  External Attachments:     1. Type:   Image          Comment:   External Document    2. Type:   Image          Comment:   External Document

## 2010-05-01 NOTE — Progress Notes (Signed)
  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 I checked a prolactin level that was normal and wanted to order imaging [will likely need MRI] to monitor as she has not had imaging in decades per the records and our conversation. She refused to let me order imaging at that time as she did not have orange card, medicaid, etc. 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 However, [facility] records stop at  [phone], meaning she has not been followed since that time. I called her just to see how she is doing. She relates that she is still having headaches. She now has orange card agrees to let the clinic call her to resume the conversation about brain imaging. I told her I would have the clinic staff call her to make an appointment and that I would let her PCP know about this. I have placed the [facility] records to be scanned into the EMR so that whoever sees her will know her history.

## 2010-05-01 NOTE — Assessment & Plan Note (Signed)
Summary: EST-1 MONTH F/U VISIT/CH   Vital Signs:  Patient profile:   55 year old female Height:      62 inches (157.48 cm) Weight:      279.3 pounds (126.95 kg) BMI:     51.27 Temp:     98.9 degrees F oral Pulse rate:   86 / minute BP sitting:   137 / 92  (right arm)  Vitals Entered By: Chinita Pester RN (November 20, 2009 1:31 PM) CC:  F/U visit; feet are swollen.  c/o hemorrhoids. Is Patient Diabetic? Yes Did you bring your meter with you today? No Pain Assessment Patient in pain? yes     Location: knees/feet Intensity: 8 Type: aching Onset of pain  Intermittent; esp w/walking. Nutritional Status BMI of > 30 = obese CBG Result 132  Have you ever been in a relationship where you felt threatened, hurt or afraid?Unable to ask   Does patient need assistance? Functional Status Self care Ambulation Normal   Primary Care Provider:  Almyra Deforest MD  CC:   F/U visit; feet are swollen.  c/o hemorrhoids..  History of Present Illness: This is 55 year old female with PMH significant for Diabetes, Hypertension, Osteoarthritis and Knee pain who is here for a 1 month follow up after starting Accuretic and Hydrocodone-Acetaminophen at the last visit. The patient mentioned that she was feeling dizzy since she is taking multiple BP medication. By reviewing her medication list it was noted that she was along with Metoprolol and Accuretic  Amlodipine and Lisinopril  although she was advised to stop these drugs at the last visit. Her main concern for todays visit is her left knee pain with left ankle swelling ( which has been stable) and hemorrhoids as well as constipation.  For her knee pain she recieved shots in the past with Dr Jennette Kettle which helped her somewhat with the pain. The Hydrocodone-Acetaminophen is helping her especially during the night since she can sleep for a longer period of time without waking up with pain. She was informed by Dr Jennette Kettle that only a knee replacement will improve her  symptoms but currently she is not able to undego this since she has no insurance. Medicare is put on hold until there is a decision made on her disability status.       Depression History:      The patient denies a depressed mood most of the day and a diminished interest in her usual daily activities.        Comments:  Very emotional about death of mother-73yrs ago.    Preventive Screening-Counseling & Management  Alcohol-Tobacco     Alcohol drinks/day: 0     Smoking Status: never  Caffeine-Diet-Exercise     Does Patient Exercise: yes     Type of exercise: WALKING     Times/week: <3  Current Medications (verified): 1)  Glucophage 500 Mg  Tabs (Metformin Hcl) .... Take 1 Tablet By Mouth Two Times A Day 2)  Addaprin 200 Mg  Tabs (Ibuprofen) .... Take One Tablet 3-4 Times Per Day As Needed For Pain 3)  Simvastatin 40 Mg Tabs (Simvastatin) .... Take 1 Tab By Mouth At Bedtime 4)  Lopressor 50 Mg Tabs (Metoprolol Tartrate) .... Take 1 Tablet By Mouth Two Times A Day 5)  Hydrocodone-Acetaminophen 5-500 Mg Tabs (Hydrocodone-Acetaminophen) .... Take 1 Tablet By Mouth Every 6 Hours As Needed For Pain 6)  Accuretic 20-25 Mg Tabs (Quinapril-Hydrochlorothiazide) .... Take 1 Tablet By Mouth Once A Day 7)  Aspirin 81 Mg Tbec (Aspirin) .... Take 1 Tablet By Mouth Once A Day 8)  Colace 100 Mg Caps (Docusate Sodium) .... Take One Tablet By Mouth Once A Day Prn 9)  Anusol-Hc 2.5 % Crea (Hydrocortisone) .Marland Kitchen.. 1 Applicator Full Pr Bid For 1 Week and Then Stop 10)  Tucks 50 % Pads [facility]) .... Use As Needed in The Affected Area  Allergies: No Known Drug Allergies  Review of Systems General:  Denies chills, fatigue, and loss of appetite. CV:  Denies chest pain or discomfort and difficulty breathing at night. Resp:  Denies cough and shortness of breath. GI:  Complains of constipation and hemorrhoids; denies nausea and vomiting; blood in stool after straining . GU:  Denies dysuria, hematuria,  incontinence, nocturia, urinary frequency, and urinary hesitancy. Alyssa:  Complains of joint pain and joint swelling; denies joint redness, low back pain, stiffness, and thoracic pain; joint pain in the left knee and left ankle .  Physical Exam  General:  alert and overweight-appearing.   Head:  normocephalic.   Lungs:  Normal respiratory effort, chest expands symmetrically. Lungs are clear to auscultation, no crackles or wheezes. Heart:  Normal rate and regular rhythm. S1 and S2 normal without gallop, murmur, click, rub or other extra sounds. Abdomen:  Bowel sounds positive,abdomen soft and non-tender without masses, organomegaly or hernias noted. Msk:  ankle joint tenderness, joint swelling, and joint warmth decreased range of motion. Knee joint tenderness, joint swelling, no warmth, no crepitation, decreased range of motion, pain with movement.  Pulses:  R and L carotid,radial,femoral,dorsalis pedis and posterior tibial pulses are full and equal bilaterally Extremities:  2+ left pedal edema and 1+ right pedal edema.   Neurologic:  alert & oriented X3.     Impression & Recommendations:  Problem # 1:  KNEE PAIN (ICD-719.46) Mostlikely due osteoarthritis. She was started at the last visit with Hydrocodone-acetaminophen which relieved her pain especially during the night. She no able to sleep without waking up with pain. She received shots in the past which helped somewhat. Dr Jennette Kettle noted that only a knee replacement will improve her symptoms but patient has currently no insurance. Medicaid is put on hold until further dicisions are made concerning her disability. We recommended to follow up with Dr Jennette Kettle to receive a shot in the knee. Furthermore an Xray of her left knee and ankle were ordered to evaluate the progress of osteoarthritis.   Her updated medication list for this problem includes:    Addaprin 200 Mg Tabs (Ibuprofen) .Marland Kitchen... Take one tablet 3-4 times per day as needed for pain     Hydrocodone-acetaminophen 5-500 Mg Tabs (Hydrocodone-acetaminophen) .Marland Kitchen... Take 1 tablet by mouth every 6 hours as needed for pain    Aspirin 81 Mg Tbec (Aspirin) .Marland Kitchen... Take 1 tablet by mouth once a day  Orders: Sports Medicine (Sports Med)Future Orders: Radiology other (Radiology Other) ... 11/21/2009  Problem # 2:  CONSTIPATION, CHRONIC (ICD-564.09) Patient complains about chronic constipation. She was advised to change diet to more fiber containing food  and increase fluid intake and was recommended to take fiber supplements. Furthermore she was prescribed colace.   sitzbath, tulks medicated wipes, anusul (hydrocortison)creams just one week  over the counter symptomatic relieve  8/26 at 1015 with dr neal  Her updated medication list for this problem includes:    Colace 100 Mg Caps (Docusate sodium) .Marland Kitchen... Take one tablet by mouth once a day prn  Problem # 3:  HEMORRHOIDS (ICD-455.6) Patient was complaining  about severe hemorrhoids which are painfull. Having chronic constipation patient has often to strain. She was advised to do sitzbath, use tulks medicated wipes and anusul cream for one week.   Problem # 4:  HYPERTENSION (ICD-401.9) Patient was advised again not to use Lisinopril and Amlodipine. BP was elevated at this visit.We will continue on current regimen and follow up closely.  Her updated medication list for this problem includes:    Lopressor 50 Mg Tabs (Metoprolol tartrate) .Marland Kitchen... Take 1 tablet by mouth two times a day    Accuretic 20-25 Mg Tabs (Quinapril-hydrochlorothiazide) .Marland Kitchen... Take 1 tablet by mouth once a day  I saw ans examined [name] with Dr Loistine Chance and I agree with her note above. We will obtain plan films of her knee and ankle. Since she responded to the last steroid knee shot, we weill refer pt back to Dr Jennette Kettle for consideration of repeating the injection. The narcotic allows her to sleep at night.  Medications Added to Medication List This Visit: 1)  Colace 100 Mg  Caps (Docusate sodium) .... Take one tablet by mouth once a day prn 2)  Anusol-hc 2.5 % Crea (Hydrocortisone) .Marland Kitchen.. 1 applicator full pr bid for 1 week and then stop 3)  Tucks 50 % Pads (Witch hazel) .... Use as needed in the affected area  Complete Medication List: 1)  Glucophage 500 Mg Tabs (Metformin hcl) .... Take 1 tablet by mouth two times a day 2)  Addaprin 200 Mg Tabs (Ibuprofen) .... Take one tablet 3-4 times per day as needed for pain 3)  Simvastatin 40 Mg Tabs (Simvastatin) .... Take 1 tab by mouth at bedtime 4)  Lopressor 50 Mg Tabs (Metoprolol tartrate) .... Take 1 tablet by mouth two times a day 5)  Hydrocodone-acetaminophen 5-500 Mg Tabs (Hydrocodone-acetaminophen) .... Take 1 tablet by mouth every 6 hours as needed for pain 6)  Accuretic 20-25 Mg Tabs (Quinapril-hydrochlorothiazide) .... Take 1 tablet by mouth once a day 7)  Aspirin 81 Mg Tbec (Aspirin) .... Take 1 tablet by mouth once a day 8)  Colace 100 Mg Caps (Docusate sodium) .... Take one tablet by mouth once a day prn 9)  Anusol-hc 2.5 % Crea (Hydrocortisone) .Marland Kitchen.. 1 applicator full pr bid for 1 week and then stop 10)  Tucks 50 % Pads (Witch hazel) .... Use as needed in the affected area  Other Orders: Capillary Blood Glucose/CBG (54098)  Patient Instructions: 1)  Please schedule a follow-up appointment in 2 months. Prescriptions: HYDROCODONE-ACETAMINOPHEN 5-500 MG TABS (HYDROCODONE-ACETAMINOPHEN) Take 1 tablet by mouth every 6 hours as needed for pain  #90 x 0   Entered and Authorized by:   Almyra Deforest MD   Signed by:   Almyra Deforest MD on 11/20/2009   Method used:   Print then Give to Patient   RxID:   [id number] SIMVASTATIN 40 MG TABS (SIMVASTATIN) Take 1 tab by mouth at bedtime  #30 x 5   Entered and Authorized by:   Almyra Deforest MD   Signed by:   Almyra Deforest MD on 11/20/2009   Method used:   Print then Give to Patient   RxID:   [id number]     Prevention & Chronic  Care Immunizations   Influenza vaccine: Not documented   Influenza vaccine deferral: Not indicated  (07/18/2009)    Tetanus booster: Not documented   Td booster deferral: Deferred  (10/09/2009)    Pneumococcal vaccine: Not documented   Pneumococcal vaccine deferral: Deferred  (10/09/2009)  Colorectal  Screening   Hemoccult: Not documented   Hemoccult action/deferral: Refused  (07/18/2009)    Colonoscopy: Location:  [facility].    (12/23/2007)   Colonoscopy action/deferral: patient refused  (05/07/2006)   Colonoscopy due: 11/2017  Other Screening   Pap smear: Not documented   Pap smear action/deferral: Deferred  (07/18/2009)    Mammogram: No specific mammographic evidence of malignancy.  Assessment: BIRADS 1.   (05/29/2009)   Mammogram action/deferral: Screening mammogram in 1 year.     (05/29/2009)   Smoking status: never  (11/20/2009)  Diabetes Mellitus   HgbA1C: 5.9  (10/09/2009)    Eye exam: No diabetic retinopathy.   Visual acuity OD (best corrected): 20/50 Visual acuity OS (best corrected): 20/30   (07/27/2008)    Foot exam: yes  (10/09/2009)   Foot exam action/deferral: Do today   High risk foot: No  (07/18/2009)   Foot care education: Done  (07/18/2009)    Urine microalbumin/creatinine ratio: 8.1  (04/14/2009)  Lipids   Total Cholesterol: 148  (10/09/2009)   LDL: 75  (10/09/2009)   LDL Direct: Not documented   HDL: 54  (10/09/2009)   Triglycerides: 94  (10/09/2009)    SGOT (AST): 17  (10/09/2009)   SGPT (ALT): 13  (10/09/2009)   Alkaline phosphatase: 46  (10/09/2009)   Total bilirubin: 0.3  (10/09/2009)  Hypertension   Last Blood Pressure: 137 / 92  (11/20/2009)   Serum creatinine: 0.84  (10/09/2009)   Serum potassium 4.5  (10/09/2009)  Self-Management Support :   Personal Goals (by the next clinic visit) :     Personal A1C goal: 7  (04/26/2009)     Personal blood pressure goal: 130/80  (04/26/2009)     Personal LDL goal: 100   (04/26/2009)    Patient will work on the following items until the next clinic visit to reach self-care goals:     Medications and monitoring: take my medicines every day, bring all of my medications to every visit, examine my feet every day  (11/20/2009)     Eating: drink diet soda or water instead of juice or soda, eat more vegetables, use fresh or frozen vegetables, eat foods that are low in salt, eat baked foods instead of fried foods, eat fruit for snacks and desserts  (11/20/2009)     Activity: take a 30 minute walk every day  (11/20/2009)     Other: walk to mail box or store once a week  (11/23/2008)    Diabetes self-management support: Written self-care plan  (11/20/2009)   Diabetes care plan printed    Hypertension self-management support: Written self-care plan  (11/20/2009)   Hypertension self-care plan printed.    Lipid self-management support: Written self-care plan  (11/20/2009)   Lipid self-care plan printed.  Pain contract signed; copy given to pt.   Chinita Pester RN  November 20, 2009 3:38 PM

## 2010-05-01 NOTE — Assessment & Plan Note (Signed)
Summary: ER-FU LEG AND KNEE PAIN-(EVANS)/CFB   Vital Signs:  Patient profile:   55 year old female Height:      62 inches (157.48 cm) Weight:      278.1 pounds (126.41 kg) BMI:     51.05 Temp:     96.7 degrees F (35.94 degrees C) oral Pulse rate:   59 / minute BP sitting:   146 / 101  (left arm) Cuff size:   large  Vitals Entered By: Theotis Barrio NT II (April 14, 2009 11:25 AM) CC: LEFT LEG PAIN #10 FOR ABOUT 4 WEEKS /  WENT TO ED  / SOB FOR ABOUT 1 MONTH  O2 SAT 96% Is Patient Diabetic? Yes Did you bring your meter with you today? No Pain Assessment Patient in pain? yes     Location: LEFT LEG Intensity:        10 Type: THROBS Onset of pain  VERY PAINFUL WHEN UP WALKING Nutritional Status BMI of > 30 = obese CBG Result 110  Have you ever been in a relationship where you felt threatened, hurt or afraid?No   Does patient need assistance? Functional Status Self care Ambulation Normal Comments SOB FOR ABOUT A MONTH  = O2 SAT-96% /  LEFT LEG PAIN FOR ABOUT 4 WEEKS  /  MEDICATION REFILL    Primary Care Provider:  Olene Craven MD  CC:  LEFT LEG PAIN #10 FOR ABOUT 4 WEEKS /  WENT TO ED  / SOB FOR ABOUT 1 MONTH  O2 SAT 96%.  History of Present Illness: 55 yo woman with PMH as listed below who presents for an ED fu for left leg and knee pain:  Seen in ED 04-03-2009 for L hip, knee pain. Had no trauma. Had imaging of the L-spine [ Spondylosis.  Grade 1 anterolisthesis of L4-5.], L hip [Negative], and L knee [Degenerative changes of the medial compartment and patellofemoral  joint ].  Given vicodin and ibuprofen and told to fu with PCP. Says that she has a flat left foot and that she saw a foot specialist for this and that she would have left foot swelling and pain if she stood for a long time. She also has pain in the left knee for  ~1 month. She says that it hurts all the time. She also has pain that shoot through her thigh when she moves her knee.  Headache: Having  headaches. Says that she has been having off and on headaches for 3-4 months. Gets 2-3/month. Taking Ibuprofen makes them go away. Located in frontal region. No aura or visual changes when they happen. But can feel dizzy with them and often happen when she first wakes up.   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 Says that she only had one scan and that it was  ~10 years ago.   Says that she is having trouble seeing but says that she broke her glasses and cannot afford to get new ones. Says that she saw eye doctor Dr. Hyacinth Meeker several months ago and that he said she needed new glasses.  Old problems not followed up recently:  DMII: Did not bring meter. Says it broke and she cannot afford another. However, it just on metformin 500 two times a day.   Hyperlipidemia: On simvastatin. Has been taking.  Chest pain: Cardiac cath 04-2008 showing normal coronaries. No recent chest pain.   HTN: 154/112 last visit. Says she is taking all of her BP meds without missing any doses.     Elevated LFTs: Had [place] 2007 showing fatty liver. AST, ALT mildly elevated in 04-2008 and has not been followed up.  Preventive Screening-Counseling & Management  Alcohol-Tobacco     Alcohol drinks/day: 0     Smoking Status: never  Caffeine-Diet-Exercise     Does Patient Exercise: yes     Type of exercise: WALKING     Times/week: 7  Problems Prior to Update: 1)  Pes Planus, Congenital  (ICD-754.61) 2)  Osteoarthritis  (ICD-715.90) 3)  Foot Pain, Left  (ICD-729.5) 4)  Special Screening For Malignant Neoplasms Colon  (ICD-V76.51) 5)  Hyperlipidemia  (ICD-272.4) 6)  Shoulder Pain, Left  (ICD-719.41) 7)  Knee Pain  (ICD-719.46) 8)  Obesity Nos  (ICD-278.00) 9)  Fatty Liver Disease, Hx of  (ICD-V12.79) 10)  Hypertension  (ICD-401.9) 11)  Diabetes Mellitus, Type II   (ICD-250.00)  Current Medications (verified): 1)  Glucophage 500 Mg  Tabs (Metformin Hcl) .... Take 1 Tablet By Mouth Two Times A Day 2)  Lisinopril 40 Mg  Tabs (Lisinopril) .... Take 1 Tablet By Mouth Once A Day 3)  Addaprin 200 Mg  Tabs (Ibuprofen) .... Take One Tablet 3-4 Times Per Day As Needed For Pain 4)  Simvastatin 40 Mg Tabs (Simvastatin) .... Take 1 Tab By Mouth At Bedtime 5)  Onetouch Test   Strp (Glucose Blood) .... Use To Check Your Blood Sugar Once Daily. 6)  Lopressor 50 Mg Tabs (Metoprolol Tartrate) .... Take 1 Tablet By Mouth Two Times A Day 7)  Amlodipine Besylate 5 Mg Tabs (Amlodipine Besylate) .... Take One Tab By Mouth Once Daily  Allergies (verified): No Known Drug Allergies  Review of Systems  The patient denies anorexia, fever, weight loss, weight gain, decreased hearing, hoarseness, chest pain, syncope, dyspnea on exertion, peripheral edema, prolonged cough, hemoptysis, abdominal pain, melena, hematochezia, severe indigestion/heartburn, hematuria, incontinence, genital sores, muscle weakness, suspicious skin lesions, transient blindness, depression, unusual weight change, abnormal bleeding, and enlarged lymph nodes.         Please see HPI regarding headache, left leg/knee pain.   Physical Exam  General:  alert, well-developed, well-nourished, and well-hydrated.   Head:  normocephalic, atraumatic, and no abnormalities observed.   Eyes:  vision grossly intact, pupils equal, pupils round, and pupils reactive to light.  No visual field deficits. Ears:  no external deformities.   Nose:  no external deformity.   Lungs:  normal respiratory effort, no accessory muscle use, normal breath sounds, no crackles, and no wheezes.   Heart:  normal rate, regular rhythm, no murmur, no gallop, and no rub.   Abdomen:  soft, non-tender, and normal bowel sounds.   Msk:  Patient is obese, but I do not feel a joint effusion of the left knee. Knee can range through full range of motion,  but patient does resist this due to pain. Patient has no anterior or posterior laxity, no clicks or catching while ranging knee. No erythema or warmth.  Hip can be ranged fully without pain. Extremities:  trace left pedal edema.   Neurologic:  alert & oriented X3, cranial nerves II-XII intact, strength normal in all extremities, and sensation intact to light touch.   Psych:  Oriented X3, memory intact for recent and remote, normally interactive, good eye contact, not anxious appearing, and not depressed  appearing.   Additional Exam:  My manual repeat of blood pressur after resting is consistent with the BP listed in the vitals.    Impression & Recommendations:  Problem # 1:  KNEE PAIN (ICD-719.46) I do not see any real abnormalities on exam such as anterior or posterior laxity, clicking or catching, effusion, etc. However, she is tender in the left knee when I range it through the full range of motion and also has tenderness in the left thigh at the same time. As she says she is a current patient [facility] the [facility] center for her left foot problems, I am asking her to return to their clinic as she says she is due for a followup appointment anyway. I have encouraged her to take Ibuprofen in the interval. I have informed her that this may be due in part to simple arthritis as noticed on imaging compounded by obesity.  Her updated medication list for this problem includes:    Addaprin 200 Mg Tabs (Ibuprofen) .Marland Kitchen... Take one tablet 3-4 times per day as needed for pain  Problem # 2:  DIABETES MELLITUS, TYPE II (ICD-250.00) Patient is only on metformin and A1c at goal today. She broke her meter but I tell her that as long as she is on such a minimal regimen and continues to be at goal, there is no real reason for her to check her blood glucose as long as she returns frequently as directed so that we can monitor A1cs.   Is up to date on eye exam.   Had foot exam 10/2008. Microalb/Cr not checked since  2008. Checking today.  Her updated medication list for this problem includes:    Glucophage 500 Mg Tabs (Metformin hcl) .Marland Kitchen... Take 1 tablet by mouth two times a day    Lisinopril 40 Mg Tabs (Lisinopril) .Marland Kitchen... Take 1 tablet by mouth once a day  Orders: T- Capillary Blood Glucose (16109) T-Hgb A1C (in-house) ([id number]) T-Urine Microalbumin w/creat. ratio ([phone])  Problem # 3:  HYPERTENSION (ICD-401.9) Patient's BP is above goal today and was also above goal at last visit. Had side effects with HCTZ in past. HAs room to increase amlodipine so I am increasing from 5-->10mg /day today and having her return in 2 weeks to recheck her blood pressure.   The following medications were removed from the medication list:    Amlodipine Besylate 5 Mg Tabs (Amlodipine besylate) .Marland Kitchen... Take one tab by mouth once daily Her updated medication list for this problem includes:    Lisinopril 40 Mg Tabs (Lisinopril) .Marland Kitchen... Take 1 tablet by mouth once a day    Lopressor 50 Mg Tabs (Metoprolol tartrate) .Marland Kitchen... Take 1 tablet by mouth two times a day    Amlodipine Besylate 10 Mg Tabs (Amlodipine besylate) .Marland Kitchen... Take 1 tablet by mouth once a day  Orders: T-Comprehensive Metabolic Panel ([id number])  Problem # 4:  HYPERLIPIDEMIA (ICD-272.4) LDL 101 04-2008. Has not been checked since. Is fasting. Checking lipid panel. Also checking LFTs as is on statin.  Her updated medication list for this problem includes:    Simvastatin 40 Mg Tabs (Simvastatin) .Marland Kitchen... Take 1 tab by mouth at bedtime  Orders: T-Comprehensive Metabolic Panel [phone]) T-Lipid Profile ([id number])  Problem # 5:  Preventive Health Care (ICD-V70.0) Patient refuses pneumovax and tetanus today. Up to date on colonoscopy, flu.  Referring for repeat mammo.   Problem # 6:  TRANSAMINASES, SERUM, ELEVATED (ICD-790.4) Patient had mildly elevated AST [38] and ALT [45] in 04-2008. She does  have a history of fatty liver disease listed in  problem list and is also on statin. Rechecking LFTs today.   **LFts have normalized.  Problem # 7:  HEADACHE (ICD-784.0) The patient is having a few headaches a month for the last few months. She does have a history of a pituitary lesion that was operated on in the distant past, but I am not clear of the exact details of this. She says that at the time she had this pituitary mass she was having headaches but that these headaches today are different than those headaches. She says that ibuprofen helps them. I say to keep using ibuprofen as these may be simple headaches and un-related to her hx of pituitary mass. However, I tell her that we need to further investigate this history of pituitary mass. Please see plan below.   Her updated medication list for this problem includes:    Addaprin 200 Mg Tabs (Ibuprofen) .Marland Kitchen... Take one tablet 3-4 times per day as needed for pain    Lopressor 50 Mg Tabs (Metoprolol tartrate) .Marland Kitchen... 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 I do not see record of any pituitary abnormality in the EMR or Echart. However, reading the old paper chart I do see some mention of a pituitary lesion in the past. However, I do not see anything that tells me that nature of this lesion, exactly when or how it was treated, or how it was to be monitored. From my converation with the patient I asked her if it might have been a prolactinoma and she says that this sounds familiar. I discussed with case with Dr. Phillips Odor and we think that a repeat prolacin level and a CT of the head would appropriate as well as getting records from [facility]. The patient has asked that I do not order a CT at this moment as she has no way to pay for it but she did just apply for Medicaid a few months ago. She is also going to meet with Rudell Cobb to talk about the orange card in the interval. I have instructed her specifically to  return no later than 2 months from now to resume this discussion. I have emphasized the importance of this and have explained to her that this needs to followed up specifically and that it does not need to get dropped off of the radar again and for her to bring this up speciically if it is not addressed in the future.  I am sending a records request today and checking prolactin level.  *Of note, the patient has no neuro findings today that I can appreciate. She is complaining of vision loss,she says this is because she broke her glasses. She says that she did see an eye doctor in the last few months. No visual field deficits on exam today.  **Prolactin WNL today.  Orders: T-Prolactin [phone])  Complete Medication List: 1)  Glucophage 500 Mg Tabs (Metformin hcl) .... Take 1 tablet by mouth two times a day 2)  Lisinopril 40 Mg Tabs (Lisinopril) .... Take 1 tablet by mouth once a day 3)  Addaprin 200 Mg Tabs (Ibuprofen) .... Take one tablet 3-4 times per day as needed for pain 4)  Simvastatin 40 Mg Tabs (Simvastatin) .... Take 1 tab by mouth at bedtime 5)  Onetouch Test Strp (Glucose blood) .... Use to check your blood sugar once daily. 6)  Lopressor 50 Mg Tabs (Metoprolol tartrate) .... Take 1 tablet by mouth two times a day 7)  Amlodipine Besylate 10 Mg Tabs (Amlodipine besylate) .... Take 1 tablet by mouth once a day  Other Orders: Mammogram (Screening) (Mammo)  Process Orders Check Orders Results:     [facility]: ABN not required for this insurance Tests Sent for requisitioning (April 16, 2009 12:10 PM):     04/14/2009: [facility] -- T-Comprehensive Metabolic Panel [[id number]] (signed)     04/14/2009: [facility] -- T-Urine Microalbumin w/creat. ratio [[id number]] (signed)     04/14/2009: [facility] -- T-Lipid Profile [phone] (signed)     04/14/2009: [facility] -- T-Prolactin  [phone] (signed)   Patient Instructions: 1)  Please return to clinic in 2 weeks to recheck your blood pressure.  2)  Please call the Sports Medicine clinic to fu for your left foot and left knee.  3)  Please meet with Rudell Cobb regarding financial assistance.  4)  Please return to clinic in 2 months to discuss your headaches and hx of pituitary tumor.  Prescriptions: AMLODIPINE BESYLATE 10 MG TABS (AMLODIPINE BESYLATE) Take 1 tablet by mouth once a day  #31 x 3   Entered and Authorized by:   Aris Lot MD   Signed by:   Aris Lot MD on 04/14/2009   Method used:   Print then Give to Patient   RxID:   [id number] LOPRESSOR 50 MG TABS (METOPROLOL TARTRATE) Take 1 tablet by mouth two times a day  #62 x 5   Entered and Authorized by:   Aris Lot MD   Signed by:   Aris Lot MD on 04/14/2009   Method used:   Print then Give to Patient   RxID:   [id number] SIMVASTATIN 40 MG TABS (SIMVASTATIN) Take 1 tab by mouth at bedtime  #30 x 5   Entered and Authorized by:   Aris Lot MD   Signed by:   Aris Lot MD on 04/14/2009   Method used:   Print then Give to Patient   RxID:   [id number] LISINOPRIL 40 MG  TABS (LISINOPRIL) Take 1 tablet by mouth once a day  #30 x 5   Entered and Authorized by:   Aris Lot MD   Signed by:   Aris Lot MD on 04/14/2009   Method used:   Print then Give to Patient   RxID:   [id number] GLUCOPHAGE 500 MG  TABS (METFORMIN HCL) Take 1 tablet by mouth two times a day  #60 x 11   Entered and Authorized by:   Aris Lot MD   Signed by:   Aris Lot MD on 04/14/2009   Method used:   Print then Give to Patient   RxID:   [id number]   Prevention & Chronic Care Immunizations   Influenza vaccine: Not documented   Influenza vaccine deferral: Refused  (04/14/2009)    Tetanus booster: Not documented   Td booster deferral: Refused  (04/14/2009)    Pneumococcal vaccine: Not  documented   Pneumococcal vaccine deferral: Refused  (04/14/2009)  Colorectal Screening   Hemoccult: Not documented   Hemoccult action/deferral: Deferred  (04/14/2009)    Colonoscopy: Location:  [facility].    (12/23/2007)   Colonoscopy action/deferral: patient refused  (05/07/2006)   Colonoscopy due: 11/2017  Other Screening   Pap smear: Not documented   Pap smear action/deferral: patient refuses understanding risks of delayed diagnosis  (05/07/2006)    Mammogram: Not documented  Mammogram action/deferral: Ordered  (04/14/2009)   Smoking status: never  (04/14/2009)  Diabetes Mellitus   HgbA1C: 6.4  (04/14/2009)    Eye exam: No diabetic retinopathy.   Visual acuity OD (best corrected): 20/50 Visual acuity OS (best corrected): 20/30   (07/27/2008)    Foot exam: Not documented   Foot exam action/deferral: Do today   High risk foot: Not documented   Foot care education: Done  (11/23/2008)    Urine microalbumin/creatinine ratio: 2.9  (12/24/2006)    Diabetes flowsheet reviewed?: Yes   Progress toward A1C goal: At goal  Lipids   Total Cholesterol: 160  (04/21/2008)   LDL: 101  (04/21/2008)   LDL Direct: Not documented   HDL: 45  (04/21/2008)   Triglycerides: 71  (04/21/2008)    SGOT (AST): 32  (10/05/2007)   SGPT (ALT): 29  (10/05/2007) CMP ordered    Alkaline phosphatase: 44  (10/05/2007)   Total bilirubin: 0.4  (10/05/2007)    Lipid flowsheet reviewed?: Yes   Progress toward LDL goal: Unchanged  Hypertension   Last Blood Pressure: 146 / 101  (04/14/2009)   Serum creatinine: 0.60  (10/05/2007)   Serum potassium 4.4  (10/05/2007) CMP ordered     Hypertension flowsheet reviewed?: Yes   Progress toward BP goal: Unchanged  Self-Management Support :    Patient will work on the following items until the next clinic visit to reach self-care goals:     Medications and monitoring: take my medicines every day, bring all of my medications to every  visit, examine my feet every day  (04/14/2009)     Eating: drink diet soda or water instead of juice or soda, eat more vegetables, eat foods that are low in salt, eat baked foods instead of fried foods, eat fruit for snacks and desserts, limit or avoid alcohol  (04/14/2009)     Activity: take a 30 minute walk every day  (04/14/2009)     Other: walk to mail box or store once a week  (11/23/2008)    Diabetes self-management support: Written self-care plan  (04/14/2009)   Diabetes care plan printed    Hypertension self-management support: Written self-care plan  (04/14/2009)   Hypertension self-care plan printed.    Lipid self-management support: Written self-care plan  (04/14/2009)   Lipid self-care plan printed.   Nursing Instructions: Schedule screening mammogram (see order)    Laboratory Results   Blood Tests   Date/Time Received: April 14, 2009 11:50 AM Date/Time Reported: Alric Quan  April 14, 2009 11:50 AM   HGBA1C: 6.4%   (Normal Range: Non-Diabetic - 3-6%   Control Diabetic - 6-8%) CBG Random:: 110mg /dL

## 2010-05-01 NOTE — Assessment & Plan Note (Signed)
Summary: acute-requesting refills/cfb(illath)   Vital Signs:  Patient profile:   55 year old female Height:      62 inches (157.48 cm) Weight:      277.1 pounds (125.95 kg) BMI:     50.87 Temp:     96.7 degrees F (35.94 degrees C) oral Pulse rate:   65 / minute BP sitting:   134 / 96  (right arm) Cuff size:   large  Vitals Entered By: Chinita Pester RN (March 07, 2010 9:37 AM) CC: F/U visit. Med. refills. Is Patient Diabetic? Yes Did you bring your meter with you today? No Pain Assessment Patient in pain? yes     Location: legs [place] Intensity: 9 Type: soreness Onset of pain  Constant Nutritional Status BMI of > 30 = obese  Have you ever been in a relationship where you felt threatened, hurt or afraid?No   Does patient need assistance? Functional Status Self care Ambulation Normal Comments Pt. does not have a meter.   Primary Care Provider:  Almyra Deforest MD  CC:  F/U visit. Med. refills..  History of Present Illness: 1. DM: denies any hypoglycemia; takes metformin. Compliant with her diet and medications. REprots poor appetitie. 2. Depression: feels depressed for 3 years since she her mother's passing. Patient reports poor appetitie and frequent nocturnal awakening between 2-5 in am. Does not enjoy her usual activities. No SI/HI or mania.  Has a family support (stepfather, guardina son, and siblings who come and see her frequently.) Never took anti-depressants. 3. Patient reports being hoarse and sneezing for 1 week. no Odynophagia, dysphagia, fever, chills, sweats. Patient feels 'like she is catching a cold." She is not up-to-date on vaccination.  Depression History:      The patient is having a depressed mood most of the day and has a diminished interest in her usual daily activities.        The patient denies that she feels like life is not worth living, denies that she wishes that she were dead, and denies that she has thought about ending her life.    Comments:  not taing any medication.   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 Take 1 Tablet By Mouth Two Times A Day 2)  Addaprin 200 Mg  Tabs (Ibuprofen) .... Take One Tablet 3-4 Times Per Day As Needed For  Pain 3)  Simvastatin 40 Mg Tabs (Simvastatin) .... Take 1 Tab By Mouth At Bedtime 4)  Lopressor 50 Mg Tabs (Metoprolol Tartrate) .... Take 1 Tablet By Mouth Two Times A Day 5)  Hydrocodone-Acetaminophen 5-500 Mg Tabs (Hydrocodone-Acetaminophen) .... Take 1 Tablet By Mouth Every 6 Hours As Needed For Pain 6)  Accuretic 20-25 Mg Tabs (Quinapril-Hydrochlorothiazide) .... Take 1 Tablet By Mouth Once A Day 7)  Aspirin 81 Mg Tbec (Aspirin) .... Take 1 Tablet By Mouth Once A Day 8)  Colace 100 Mg Caps (Docusate Sodium) .... Take One Tablet By Mouth Once A Day Prn 9)  Anusol-Hc 2.5 %  Crea (Hydrocortisone) .Marland Kitchen.. 1 Applicator Full Pr Bid For 1 Week and Then Stop 10)  Tucks 50 % Pads [facility]) .... Use As Needed in The Affected Area  Current Medications (verified): 1)  Glucophage 500 Mg  Tabs (Metformin Hcl) .... Take 1 Tablet By Mouth Two Times A Day 2)  Simvastatin 40 Mg Tabs (Simvastatin) .... Take 1 Tab By Mouth At Bedtime 3)  Lopressor 50 Mg Tabs (Metoprolol Tartrate) .... Take 1 Tablet By Mouth Two Times A Day 4)  Accuretic 20-25 Mg Tabs (Quinapril-Hydrochlorothiazide) .... Take 1 Tablet By Mouth Once A Day 5)  Aspirin 81 Mg Tbec (Aspirin) .... Take 1 Tablet By Mouth Once A Day 6)  Colace 100 Mg Caps (Docusate Sodium) .... Take One Tablet By Mouth Once A Day Prn 7)  Anusol-Hc 2.5 % Crea (Hydrocortisone) .Marland Kitchen.. 1 Applicator Full Pr Bid For 1 Week and Then Stop 8)  Tucks 50 % Pads [facility]) .... Use As Needed in The Affected Area  Allergies (verified): No Known Drug Allergies  Directives (verified): 1)  Full Code   Past History:  Past Medical History: Last updated: 07/14/2008 DIABETES MELLITUS, TYPE II HYPERTENSION HYPERLIPIDEMIA OBESITY FATTY LIVER DISEASE L KNEE CPPD with patellar/quad tendon enthesopathy    -  as evidenced by xray 9/08    - attempted joint space aspiration/steroid injection unsuccessful by Dr. Laveda Abbe 01/2007 COLON HYPERPLASTIC POLYP, 12/24/07 (Dr. Christella Hartigan) NON CARDIAC CHEST PAIN, 04/2008    - Normal cath, EF 60%  Past Surgical History: Last updated: 07/14/2008 s/p pituitary tumor resection  Family History: Last updated: 04/20/2008 Mother: stroke at 40 Father: heart attack at 53 Has an aunt that died of colon CA (late 50's-early 8's).  Sister had MI at 71.  Social History: Last updated: 07/14/2008 Never Smoked. Lives at home with son, currently unemployed. Is separated from husband - still sexually active with him.  Risk Factors: Alcohol Use: 0 (03/07/2010) Exercise: yes (03/07/2010)  Risk Factors: Smoking Status:  never (03/07/2010)  Physical Exam  General:  alert, well-developed, well-nourished, well-hydrated, and overweight-appearing.   Head:  normocephalic.   Eyes:  pupils equal, pupils round, and pupils reactive to light.   Mouth:  no gingival abnormalities and pharynx pink and moist.   Neck:  supple, full ROM, and no masses.   Lungs:  Normal respiratory effort, chest expands symmetrically. Lungs are clear to auscultation, no crackles or wheezes. Heart:  Normal rate and regular rhythm. S1 and S2 normal without gallop, murmur, click, rub or other extra sounds. Abdomen:  Bowel sounds positive,abdomen soft and non-tender without masses, organomegaly or hernias noted. Pulses:  R and L carotid,radial,femoral,dorsalis pedis and posterior tibial pulses are full and equal bilaterally Extremities:  2+ left pedal edema and 1+ right pedal edema.   Neurologic:  alert & oriented X3.    Diabetes Management Exam:  Foot Exam (with socks and/or shoes not present):       Sensory-Pinprick/Light touch:          Left medial foot (L-4): normal          Left dorsal foot (L-5): normal          Left lateral foot (S-1): normal          Right medial foot (L-4): normal          Right dorsal foot (L-5): normal          Right lateral foot (S-1): normal       Sensory-Monofilament:          Left foot: normal          Right foot: normal       Inspection:          Left foot: normal          Right foot: normal       Nails:          Left foot: normal          Right foot: normal    Foot Exam by Podiatrist:       Date: 03/07/2010       Results: no diabetic findings       Done by: Denton Meek   Impression & Recommendations:  Problem # 1:  DEPRESSION, MILD, RECURRENT (ICD-296.31) Assessment Deteriorated Liikely worsened due to upcoming Holiday season. Will start Fluoxetine. Side-effects reviewed. If SI/HI or mania->needs to call 911 or go to ED. will reevaluate in 4 weeks.  Problem # 2:  HOARSENESS  (HYQ-657.84) Assessment: Unchanged likley r/t URI. Saline garles three times a day as needed, hydration, rest and avoidance of exposure to cold temps.  Problem # 3:  DIABETES MELLITUS, TYPE II (ICD-250.00) Assessment: Unchanged Will decrease dose of metformin to 500 mg by mouth daily due to diarrhea. Patitent's CBG's do not run above 141. Her updated medication list for this problem includes:    Glucophage 500 Mg Tabs (Metformin hcl) .Marland Kitchen... Take 1 tablet by mouth once a day    Accuretic 20-25 Mg Tabs (Quinapril-hydrochlorothiazide) .Marland Kitchen... Take 1 tablet by mouth once a day    Aspirin 81 Mg Tbec (Aspirin) .Marland Kitchen... Take 1 tablet by mouth once a day  Orders: T- Capillary Blood Glucose (82948) T-Hgb A1C (in-house) (69629BM) Ophthalmology Referral (Ophthalmology)  Problem # 4:  DIABETES MELLITUS, TYPE II (ICD-250.00) Assessment: Unchanged  Complete Medication List: 1)  Glucophage 500 Mg Tabs (Metformin hcl) .... Take 1 tablet by mouth once a day 2)  Simvastatin 40 Mg Tabs (Simvastatin) .... Take 1 tab by mouth at bedtime 3)  Lopressor 50 Mg Tabs (Metoprolol tartrate) .... Take 1 tablet by mouth two times a day 4)  Accuretic 20-25 Mg Tabs (Quinapril-hydrochlorothiazide) .... Take 1 tablet by mouth once a day 5)  Aspirin 81 Mg Tbec (Aspirin) .... Take 1 tablet by mouth once a day 6)  Colace 100 Mg Caps (Docusate sodium) .... Take one tablet by mouth once a day prn 7)  Anusol-hc 2.5 % Crea (Hydrocortisone) .Marland Kitchen.. 1 applicator full pr bid for 1 week and then stop 8)  Tucks 50 % Pads (Witch hazel) .... Use as needed in the affected area 9)  Fluoxetine Hcl 20 Mg Caps (Fluoxetine hcl) .... Take one tablet by mouth daily for depression  Other Orders: Tdap => 6yrs IM (84132) Pneumococcal Vaccine (44010) Admin 1st Vaccine (27253) Admin of Any Addtl Vaccine (66440) Influenza Vaccine  NON MCR [phone])  Patient Instructions: 1)  You recieved a flus shot, Pneumona shot and Tdap today. 2)  Please, fill in  your prescription for Fluoxetine for depression. 3)  Decrease Metformin to one tablet daily with meals. 4)  please, call with any questions. Prescriptions: FLUOXETINE HCL 20 MG CAPS (FLUOXETINE HCL) Take one tablet by mouth daily for depression  #30 x 11   Entered and Authorized by:   Deatra Robinson MD   Signed by:   Deatra Robinson MD on 03/07/2010   Method used:   Electronically to        [facility]  [address] [phone]* (retail)       [address]       [place], [place]  [id number]       Ph: [phone] or [phone]       Fax: [phone]   RxID:   [id number] ACCURETIC 20-25 MG TABS (QUINAPRIL-HYDROCHLOROTHIAZIDE) Take 1 tablet by mouth once a day  #30 x 11   Entered and Authorized by:   Deatra Robinson MD   Signed by:   Deatra Robinson MD on 03/07/2010   Method used:   [facility] to        [facility]  [address] [phone]* (retail)       [address]       [place], [place]  [address]       Ph: [phone] or [phone]       Fax: [phone]   RxID:   [id number] LOPRESSOR 50 MG TABS (METOPROLOL TARTRATE) Take 1 tablet by mouth two times a day  #30 x 11   Entered and Authorized by:   Deatra Robinson MD   Signed by:   Deatra Robinson MD on 03/07/2010   Method used:   [facility] to        [facility]  [address] [phone]* (retail)       [address]       [place], [place]  [address]       Ph: [phone] or [phone]       Fax: [phone]   RxID:   [id number] SIMVASTATIN 40 MG TABS (SIMVASTATIN) Take 1 tab by mouth at bedtime  #30 x 11   Entered and Authorized by:   Deatra Robinson MD   Signed by:   Deatra Robinson MD on 03/07/2010   Method used:   [facility] to        [facility]  [address] [phone]* (retail)       [address]       [place], [place]  [address]       Ph: [phone] or [phone]       Fax: [phone]   RxID:    [id number] GLUCOPHAGE 500 MG  TABS (METFORMIN HCL) Take 1 tablet by mouth two times a day  #60 x 11   Entered and Authorized by:   Deatra Robinson MD   Signed by:   Deatra Robinson MD on 03/07/2010   Method used:   [facility]        [facility]  [address] [phone][facility] (retail)       [address]       [place], [place]  [address]  Ph: [phone] or [phone]       Fax: [phone]   RxID:   [id number]    Orders Added: 1)  T- Capillary Blood Glucose [82948] 2)  T-Hgb A1C (in-house) [83036QW] 3)  Est. Patient Level IV [87564] 4)  Tdap => 41yrs IM [90715] 5)  Pneumococcal Vaccine [90732] 6)  Admin 1st Vaccine [90471] 7)  Admin of Any Addtl Vaccine [90472] 8)  Influenza Vaccine NON MCR [00028] 9)  Ophthalmology Referral [Ophthalmology]   Immunizations Administered:  Tetanus Vaccine:    Vaccine Type: Tdap    Site: left deltoid    Mfr: GlaxoSmithKline    Dose: 0.5 ml    Route: IM    Given by: Chinita Pester RN    Exp. Date: 01/19/2012    Lot #: [id number]    VIS given: 02/17/08 version given March 07, 2010.  Pneumonia Vaccine:    Vaccine Type: Pneumovax    Site: right deltoid    Mfr: Merck    Dose: 0.5 ml    Route: IM    Given by: Chinita Pester RN    Exp. Date: 07/27/2011    Lot #: [id number]    VIS given: 03/06/09 version given March 07, 2010.  Influenza Vaccine # 1:    Vaccine Type: Fluvax Non-MCR    Site: left deltoid    Mfr: GlaxoSmithKline    Dose: 0.5 ml    Route: IM    Given by: Chinita Pester RN    Exp. Date: 09/29/2010    Lot #: [id number]    VIS given: 10/24/09 version given March 07, 2010.  Flu Vaccine Consent Questions:    Do you have a history of severe allergic reactions to this vaccine? no    Any prior history of allergic reactions to egg and/or gelatin? no    Do you have a sensitivity to the preservative Thimersol? no    Do you have a past history of Guillan-Barre Syndrome? no    Do you  currently have an acute febrile illness? no    Have you ever had a severe reaction to latex? no    Vaccine information given and explained to patient? yes    Are you currently pregnant? no   Immunizations Administered:  Tetanus Vaccine:    Vaccine Type: Tdap    Site: left deltoid    Mfr: GlaxoSmithKline    Dose: 0.5 ml    Route: IM    Given by: Chinita Pester RN    Exp. Date: 01/19/2012    Lot #: [id number]    VIS given: 02/17/08 version given March 07, 2010.  Pneumonia Vaccine:    Vaccine Type: Pneumovax    Site: right deltoid    Mfr: Merck    Dose: 0.5 ml    Route: IM    Given by: Chinita Pester RN    Exp. Date: 07/27/2011    Lot #: [id number]    VIS given: 03/06/09 version given March 07, 2010.  Influenza Vaccine # 1:    Vaccine Type: Fluvax Non-MCR    Site: left deltoid    Mfr: GlaxoSmithKline    Dose: 0.5 ml    Route: IM    Given by: Chinita Pester RN    Exp. Date: 09/29/2010    Lot #: [id number]    VIS given: 10/24/09 version given March 07, 2010.   Prevention & Chronic Care Immunizations   Influenza vaccine: Fluvax Non-MCR  (03/07/2010)   Influenza vaccine deferral: Not  indicated  (07/18/2009)   Influenza vaccine due: 12/01/2010    Tetanus booster: 03/07/2010: Tdap   Td booster deferral: Deferred  (10/09/2009)   Tetanus booster due: 03/07/2020    Pneumococcal vaccine: Pneumovax  (03/07/2010)   Pneumococcal vaccine deferral: Deferred  (10/09/2009)   Pneumococcal vaccine due: 03/12/2020  Colorectal Screening   Hemoccult: Not documented   Hemoccult action/deferral: Refused  (07/18/2009)    [facility]: [facility]    ([facility])   Colonoscopy action/deferral: patient refused  (05/07/2006)   Colonoscopy due: 11/2017  Other Screening   Pap smear: Not documented   Pap smear action/deferral: Deferred  (07/18/2009)    Mammogram: No specific mammographic evidence of malignancy.  Assessment: BIRADS 1.   (05/29/2009)    Mammogram action/deferral: Screening mammogram in 1 year.     (05/29/2009)   Mammogram due: 05/29/2010   Smoking status: never  (03/07/2010)  Diabetes Mellitus   HgbA1C: 6.4  (03/07/2010)   Hemoglobin A1C due: 10/10/2010    Eye exam: No diabetic retinopathy.   Visual acuity OD (best corrected): 20/50 Visual acuity OS (best corrected): 20/30   (07/27/2008)   Diabetic eye exam action/deferral: Ophthalmology referral  (03/07/2010)   Eye exam due: 07/27/2009    Foot exam: yes  (03/07/2010)   Foot exam action/deferral: Do today   High risk foot: No  (07/18/2009)   Foot care education: Done  (07/18/2009)   Foot exam due: 10/10/2010    Urine microalbumin/creatinine ratio: 8.1  (04/14/2009)   Urine microalbumin/cr due: 04/14/2010    Diabetes flowsheet reviewed?: Yes   Progress toward A1C goal: At goal    Stage of readiness to change (diabetes management): Maintenance  Lipids   Total Cholesterol: 148  (10/09/2009)   LDL: 75  (10/09/2009)   LDL Direct: Not documented   HDL: 54  (10/09/2009)   Triglycerides: 94  (10/09/2009)   Lipid panel due: 10/10/2010    SGOT (AST): 17  (10/09/2009)   SGPT (ALT): 13  (10/09/2009)   Alkaline phosphatase: 46  (10/09/2009)   Total bilirubin: 0.3  (10/09/2009)   Liver panel due: 10/10/2010    Lipid flowsheet reviewed?: Yes   Progress toward LDL goal: Unchanged    Stage of readiness to change (lipid management): Maintenance  Hypertension   Last Blood Pressure: 134 / 96  (03/07/2010)   Serum creatinine: 0.84  (10/09/2009)   Serum potassium 4.5  (10/09/2009)   Basic metabolic panel due: 10/10/2010    Hypertension flowsheet reviewed?: Yes   Progress toward BP goal: Unchanged    Stage of readiness to change (hypertension management): Maintenance  Self-Management Support :   Personal Goals (by the next clinic visit) :     Personal A1C goal: 7  (04/26/2009)     Personal blood pressure goal: 130/80  (04/26/2009)     Personal LDL goal:  100  (04/26/2009)    Patient will work on the following items until the next clinic visit to reach self-care goals:     Medications and monitoring: take my medicines every day, bring all of my medications to every visit, examine my feet every day  (03/07/2010)     Eating: drink diet soda or water instead of juice or soda, eat more vegetables, use fresh or frozen vegetables, eat foods that are low in salt, eat baked foods instead of fried foods, eat fruit for snacks and desserts  (03/07/2010)     Activity: take a 30 minute walk every day  (03/07/2010)  Other: walk to mail box or store once a week  (11/23/2008)    Diabetes self-management support: Education handout, Written self-care plan  (03/07/2010)   Diabetes care plan printed   Diabetes education handout printed    Hypertension self-management support: Written self-care plan  (03/07/2010)   Hypertension self-care plan printed.    Lipid self-management support: Written self-care plan  (03/07/2010)   Lipid self-care plan printed.   Nursing Instructions: Give Flu vaccine today Give tetanus booster today Give Pneumovax today Refer for screening diabetic eye exam (see order)     Laboratory Results   Blood Tests   Date/Time Received: March 07, 2010 10:01 AM Date/Time Reported: Alric Quan  March 07, 2010 10:01 AM   HGBA1C: 6.4%   (Normal Range: Non-Diabetic - 3-6%   Control Diabetic - 6-8%) CBG Fasting:: 119mg /dL

## 2010-05-01 NOTE — Miscellaneous (Signed)
Summary: HEALTH INFORMATION RELEASE   HEALTH INFORMATION RELEASE   Imported By: Margie Billet 06/30/2009 11:43:25  _____________________________________________________________________  External Attachment:    Type:   Image     Comment:   External Document

## 2010-05-01 NOTE — Assessment & Plan Note (Signed)
Summary: REASSIGNED NW TO DR.CFB   Vital Signs:  Patient profile:   55 year old female Height:      62 inches (157.48 cm) Weight:      278.5 pounds (126.59 kg) BMI:     51.12 Temp:     97.1 degrees F oral Pulse rate:   76 / minute BP sitting:   123 / 75  (right arm) Cuff size:   large  Vitals Entered By: Chinita Pester RN (October 09, 2009 1:42 PM) CC: Left foot/knee swollen.  Urinary urgency/frequency., Depression Is Patient Diabetic? Yes Did you bring your meter with you today? No Pain Assessment Patient in pain? yes     Location: left leg Intensity: 7 Type: soreness/throbbing Onset of pain  Intermittent Nutritional Status BMI of > 30 = obese CBG Result 130  Have you ever been in a relationship where you felt threatened, hurt or afraid?No   Does patient need assistance? Functional Status Self care Ambulation Normal   CC:  Left foot/knee swollen.  Urinary urgency/frequency. and Depression.  History of Present Illness: This is 55 year old pleasant female with Hx of Osteoarthritis, Hypertension, Diabetes, Pituitary tumor, resected in 1979 who presented with left knee and left ankle pain and increased urin frequency.   1. Left knee pain:  The pain is sharp in character, always present, no radiation, standing makes it worse, all position are painful, severity 7/10, occasionally looses  balance due to weakness in legs She received injection into joints from Dr Jennette Kettle in 04/11 but it helped her only one day, ibuprofen just reduces the pain miniminal, she was not able to fill prescription for percocet due to financial issues.  2. left foot pain: The pain is: around the ankle and  bottom of the foot, it is always present, no radiotion, sharp in character, worse while standing, pain is always present,  7/10 in severity yesterday it was even 10/10, putting up the feet makes it better 3. Increased frequency in urination and nocturia but denies burning, dysuria or blood in the urin but  complaines about polydipsia 4. constipation:  Bowel movements 3-7 days/week, occasionally blood in the stool when she has to strains, has hemorrhoids and uses over the counter treatment  5. BP normal according to patient  ( Niece checks it regular) 6. Diabetes She does not check her Glucose regularly since her  Glucose meter broke  She hasn't seen an Ophthalmologist for a while  She checks her feet regularly 7. Depression  She feels occasionally sad in the morning when she is not able to get up from her bed or just sits infront of the TV.  There are times where eating makes her feel better when she thinks that  no one cares for her.  1 year ago one episode where she had the wish to hurt herself. At that time she calles crise control center. Now she talkes to administers at [facility] and she feels a lot better.  Now denies any feeling of worthlessness,  feeling of hurting herself, having a plan. 8. Hearing Since the resection of the pituitary tumor she complaines of diffculties hearing.      Depression History:      The patient is having a depressed mood most of the day and has a diminished interest in her usual daily activities.  Positive alarm features for depression include impaired concentration (indecisiveness).  However, she denies fatigue (loss of energy).        The patient denies  that she feels like life is not worth living, denies that she wishes that she were dead, and denies that she has thought about ending her life.         Preventive Screening-Counseling & Management  Alcohol-Tobacco     Alcohol drinks/day: 0     Smoking Status: never  Caffeine-Diet-Exercise     Does Patient Exercise: yes     Type of exercise: WALKING     Times/week: <3  Current Medications (verified): 1)  Glucophage 500 Mg  Tabs (Metformin Hcl) .... Take 1 Tablet By Mouth Two Times A Day 2)  Addaprin 200 Mg  Tabs (Ibuprofen) .... Take One Tablet 3-4 Times Per Day As Needed For Pain 3)  Simvastatin  40 Mg Tabs (Simvastatin) .... Take 1 Tab By Mouth At Bedtime 4)  Lopressor 50 Mg Tabs (Metoprolol Tartrate) .... Take 1 Tablet By Mouth Two Times A Day 5)  Hydrocodone-Acetaminophen 5-500 Mg Tabs (Hydrocodone-Acetaminophen) .... Take 1 Tablet By Mouth Every 6 Hours As Needed For Pain 6)  Accuretic 20-25 Mg Tabs (Quinapril-Hydrochlorothiazide) .... Take 1 Tablet By Mouth Once A Day 7)  Cipro 500 Mg Tabs (Ciprofloxacin Hcl) .... Take 1 Tablet By Mouth Two Times A Day 8)  Aspirin 81 Mg Tbec (Aspirin) .... Take 1 Tablet By Mouth Once A Day  Allergies: No Known Drug Allergies  Review of Systems       Since the pituitary tumor removal in 1979 hearing decreased, difficulties understanding. Never had menses. Right thigh feeling of numbness since surgery in 1979. Tissue graft ... General:  Complains of loss of appetite and weakness; denies fever. Eyes:  Complains of blurring. ENT:  Complains of decreased hearing; denies ear discharge, earache, postnasal drainage, and ringing in ears; Since the resection of pituitary tumor in 1979 she complaines of difficulties understanding people. . CV:  Denies chest pain or discomfort, difficulty breathing at night, difficulty breathing while lying down, lightheadness, palpitations, and shortness of breath with exertion. Resp:  Denies cough. GI:  Complains of abdominal pain, constipation, and hemorrhoids. GU:  Complains of nocturia and urinary frequency. MS:  Complains of joint pain, joint swelling, loss of strength, and muscle weakness.  Physical Exam  General:  alert and overweight-appearing.   Head:  normocephalic and atraumatic.   Eyes:  pupils equal, pupils round, and pupils reactive to light.   Ears:  R ear normal, L ear normal, and no external deformities.   Mouth:  no gingival abnormalities and pharynx pink and moist.   Neck:  supple, full ROM, and no masses.   Lungs:  normal respiratory effort, normal breath sounds, and no wheezes.   Heart:  normal  rate, regular rhythm, no murmur, no gallop, no rub, and no JVD.   Abdomen:  soft, normal bowel sounds, and no rebound tenderness.  right costovertable tenderness on palpation Msk:  joint tenderness and joint swelling.   Cervical Nodes:  no anterior cervical adenopathy.   Psych:  Oriented X3, normally interactive, good eye contact, not anxious appearing, and poor concentration.    Diabetes Management Exam:    Foot Exam (with socks and/or shoes not present):       Sensory-Pinprick/Light touch:          Left medial foot (L-4): normal          Left dorsal foot (L-5): normal          Left lateral foot (S-1): normal       Sensory-Monofilament:  Left foot: normal          Right foot: normal       Inspection:          Left foot: normal          Right foot: normal       Nails:          Left foot: normal          Right foot: normal   Knee Exam  General:    obese.    Skin:    healthy-R and healthy-L.    Palpation:    tenderness L-medial joint line, tenderness L-lateral joint line, and tenderness L-Parapatellar.     Foot/Ankle Exam  General:    obese.    Palpation:    tenderness L-hindfoot: and tenderness L-lateral malleoulus:Marland Kitchen    Vascular:    decreased L-dorsalis pedis.     Impression & Recommendations:  Problem # 1:  OSTEOARTHRITIS (ICD-715.90) The patient complaines about severe knee pain since years. Ibuprofen is not relieving her pain. We recommended to Hydrocodone for pain control. We also suggested weight reduction to improve her her symptoms. But this is only a temporary situation. Evantually she does need a knee replacement.  Left Knee Xray from 05/2006 showes Patellofemoral joint space narrowing consistent with CPPD. Enthesopathy at the insertion of the  patellarand quadriceps tendons. Mild loss of bone mineral density. Left Ankle xray from 07/2007 showes ankle osteoarthritis and calcaneal spur  The following medications were removed from the medication  list:    Percocet 7.5-500 Mg Tabs (Oxycodone-acetaminophen) .Marland Kitchen... Take 1 tablet every 4-6 hours as needed for pain Her updated medication list for this problem includes:    Addaprin 200 Mg Tabs (Ibuprofen) .Marland Kitchen... Take one tablet 3-4 times per day as needed for pain    Hydrocodone-acetaminophen 5-500 Mg Tabs (Hydrocodone-acetaminophen) .Marland Kitchen... Take 1 tablet by mouth every 6 hours as needed for pain    Aspirin 81 Mg Tbec (Aspirin) .Marland Kitchen... Take 1 tablet by mouth once a day  Problem # 2:  FREQUENCY, URINARY (ICD-788.41) Most likely due to UTI ( Urin dip showed traces of Leukocytes). Started her on Cipro 500 mg two times a day for 3 days. Advised to drink fluids. Orders: T-Drug Screen-Urine, (single) [phone]) T-Culture, Urine ([id number]) T-Urinalysis ([id number])  Problem # 3:  DIABETES MELLITUS, TYPE II (ICD-250.00) Continue on current medication. Advised follow up an ophthalmologist. Provided her with handouts about foot care and diabtes. Will address the issue to get a  glucose meter at the next appointment for a close glucose control. Advised weight reduction, excersise and diet control with low fat, low salt, less carbs and increase in fibers. HA1c was 5.9 today.  The following medications were removed from the medication list:    Lisinopril 40 Mg Tabs (Lisinopril) .Marland Kitchen... Take 1 tablet by mouth once a day Her updated medication list for this problem includes:    Glucophage 500 Mg Tabs (Metformin hcl) .Marland Kitchen... Take 1 tablet by mouth two times a day    Accuretic 20-25 Mg Tabs (Quinapril-hydrochlorothiazide) .Marland Kitchen... Take 1 tablet by mouth once a day    Aspirin 81 Mg Tbec (Aspirin) .Marland Kitchen... Take 1 tablet by mouth once a day  Orders: T- Capillary Blood Glucose ([id number]) T-Hgb A1C (in-house) ([id number]) T-Comprehensive Metabolic Panel ([phone]) T-Lipid Profile ([id number]) T-CBC w/Diff ([id number])  Problem # 4:  HYPERTENSION (ICD-401.9) Blood pressure well controlled. Advised to continue  medication and check BP on frequent bases. Furthermore advised weight reduction, excersise and  diet control with low fat, low salt and increase in fibers.  The following medications were removed from the medication list:    Lisinopril 40 Mg Tabs (Lisinopril) .Marland Kitchen... Take 1 tablet by mouth once a day    Amlodipine Besylate 10 Mg Tabs (Amlodipine besylate) .Marland Kitchen... Take 1 tablet by mouth once a day Her updated medication list for this problem includes:    Lopressor 50 Mg Tabs (Metoprolol tartrate) .Marland Kitchen... Take 1 tablet by mouth two times a day    Accuretic 20-25 Mg Tabs (Quinapril-hydrochlorothiazide) .Marland Kitchen... Take 1 tablet by mouth once a day  Orders: T-TSH ([id number])  Problem # 5:  DEPRESSION, MILD (ICD-311) will follow closely. Will  access the need  to start Zoloft at the next visit. The following medications were removed from the medication list:    Zoloft 100 Mg Tabs (Sertraline hcl) .Marland Kitchen... Take 1 tablet by mouth once a day  Problem # 6:  CONSTIPATION, CHRONIC (ICD-564.09) Advised to eat fiber and drink fluids. Will call if she needs anything to move her bowels.   Complete Medication List: 1)  Glucophage 500 Mg Tabs (Metformin hcl) .... Take 1 tablet by mouth two times a day 2)  Addaprin 200 Mg Tabs (Ibuprofen) .... Take one tablet 3-4 times per day as needed for pain 3)  Simvastatin 40 Mg Tabs (Simvastatin) .... Take 1 tab by mouth at bedtime 4)  Lopressor 50 Mg Tabs (Metoprolol tartrate) .... Take 1 tablet by mouth two times a day 5)  Hydrocodone-acetaminophen 5-500 Mg Tabs (Hydrocodone-acetaminophen) .... Take 1 tablet by mouth every 6 hours as needed for pain 6)  Accuretic 20-25 Mg Tabs (Quinapril-hydrochlorothiazide) .... Take 1 tablet by mouth once a day 7)  Cipro 500 Mg Tabs (Ciprofloxacin hcl) .... Take 1 tablet by mouth two times a day 8)  Aspirin 81 Mg Tbec (Aspirin) .... Take 1 tablet by mouth once a day  Other Orders: T-Urinalysis Dipstick only ([id number])  Patient  Instructions: 1)  Please schedule a follow-up appointment in 1 month.  Prescriptions: CIPRO 500 MG TABS (CIPROFLOXACIN HCL) Take 1 tablet by mouth two times a day  #6 x 0   Entered and Authorized by:   Almyra Deforest MD   Signed by:   Almyra Deforest MD on 10/09/2009   Method used:   Print then Give to Patient   RxID:   [id number] ACCURETIC 20-25 MG TABS (QUINAPRIL-HYDROCHLOROTHIAZIDE) Take 1 tablet by mouth once a day  #30 x 3   Entered and Authorized by:   Almyra Deforest MD   Signed by:   Almyra Deforest MD on 10/09/2009   Method used:   Print then Give to Patient   RxID:   [id number] HYDROCODONE-ACETAMINOPHEN 5-500 MG TABS (HYDROCODONE-ACETAMINOPHEN) Take 1 tablet by mouth every 6 hours as needed for pain  #90 x 0   Entered and Authorized by:   Almyra Deforest MD   Signed by:   Almyra Deforest MD on 10/09/2009   Method used:   Print then Give to Patient   RxID:   [id number]   Prevention & Chronic Care Immunizations   Influenza vaccine: Not documented   Influenza vaccine deferral: Not indicated  (07/18/2009)    Tetanus booster: Not documented   Td booster deferral: Deferred  (10/09/2009)    Pneumococcal vaccine: Not documented   Pneumococcal vaccine deferral: Deferred  (10/09/2009)  Colorectal Screening   Hemoccult: Not documented   Hemoccult action/deferral: Refused  (07/18/2009)    Colonoscopy: Location:  [facility].    (  12/23/2007)   Colonoscopy action/deferral: patient refused  (05/07/2006)   Colonoscopy due: 11/2017  Other Screening   Pap smear: Not documented   Pap smear action/deferral: Deferred  (07/18/2009)    Mammogram: No specific mammographic evidence of malignancy.  Assessment: BIRADS 1.   (05/29/2009)   Mammogram action/deferral: Screening mammogram in 1 year.     (05/29/2009)   Smoking status: never  (10/09/2009)  Diabetes Mellitus   HgbA1C: 5.9  (10/09/2009)    Eye exam: No diabetic retinopathy.   Visual acuity OD  (best corrected): 20/50 Visual acuity OS (best corrected): 20/30   (07/27/2008)    Foot exam: yes  (10/09/2009)   Foot exam action/deferral: Do today   High risk foot: No  (07/18/2009)   Foot care education: Done  (07/18/2009)    Urine microalbumin/creatinine ratio: 8.1  (04/14/2009)  Lipids   Total Cholesterol: 180  (04/14/2009)   LDL: 105  (04/14/2009)   LDL Direct: Not documented   HDL: 56  (04/14/2009)   Triglycerides: 95  (04/14/2009)    SGOT (AST): 22  (04/14/2009)   SGPT (ALT): 20  (04/14/2009) CMP ordered    Alkaline phosphatase: 42  (04/14/2009)   Total bilirubin: 0.4  (04/14/2009)  Hypertension   Last Blood Pressure: 123 / 75  (10/09/2009)   Serum creatinine: 0.62  (04/14/2009)   Serum potassium 4.7  (04/14/2009) CMP ordered   Self-Management Support :   Personal Goals (by the next clinic visit) :     Personal A1C goal: 7  (04/26/2009)     Personal blood pressure goal: 130/80  (04/26/2009)     Personal LDL goal: 100  (04/26/2009)    Patient will work on the following items until the next clinic visit to reach self-care goals:     Medications and monitoring: bring all of my medications to every visit, examine my feet every day  (10/09/2009)     Eating: eat more vegetables, use fresh or frozen vegetables, eat foods that are low in salt, eat baked foods instead of fried foods, eat fruit for snacks and desserts  (10/09/2009)     Activity: take a 30 minute walk every day  (10/09/2009)     Other: walk to mail box or store once a week  (11/23/2008)    Diabetes self-management support: Written self-care plan, Education handout  (10/09/2009)   Diabetes care plan printed   Diabetes education handout printed    Hypertension self-management support: Written self-care plan, Education handout  (10/09/2009)   Hypertension self-care plan printed.   Hypertension education handout printed    Lipid self-management support: Written self-care plan, Education handout   (10/09/2009)   Lipid self-care plan printed.   Lipid education handout printed  Process Orders Check Orders Results:     [facility]: ABN not required for this insurance Tests Sent for requisitioning (October 09, 2009 4:28 PM):     10/09/2009: [facility] -- T-Comprehensive Metabolic Panel [[id number]] (signed)     10/09/2009: [facility] -- T-Lipid Profile [phone] (signed)     10/09/2009: [facility] -- T-CBC w/Diff [[id number]] (signed)     10/09/2009: [facility] -- T-TSH [phone] (signed)     10/09/2009: [facility] -- T-Drug Screen-Urine, (single) [[id number]] (signed)     10/09/2009: [facility] -- T-Culture, Urine [[id number]] (signed)     10/09/2009: [facility] -- T-Urinalysis [[id number]] (signed)    Laboratory Results   Urine Tests  Date/Time Received: 10/09/09  3:30PM Date/Time  Reported: 10/09/09  3:30PM  Routine Urinalysis   Color: lt. yellow Glucose: negative   (Normal Range: Negative) Bilirubin: negative   (Normal Range: Negative) Ketone: negative   (Normal Range: Negative) Spec. Gravity: 1.025   (Normal Range: 1.003-1.035) Blood: negative   (Normal Range: Negative) pH: 6.0   (Normal Range: 5.0-8.0) Protein: negative   (Normal Range: Negative) Urobilinogen: 0.2   (Normal Range: 0-1) Nitrite: negative   (Normal Range: Negative) Leukocyte Esterace: trace   (Normal Range: Negative)     Blood Tests   Date/Time Received: [facility]  October 09, 2009 2:13 PM  Date/Time Reported: [facility])  October 09, 2009 2:13 PM   HGBA1C: 5.9%   (Normal Range: Non-Diabetic - 3-6%   Control Diabetic - 6-8%) CBG Random:: 130     Laboratory Results   Urine Tests    Routine Urinalysis   Color: lt. yellow Glucose: negative   (Normal Range: Negative) Bilirubin: negative   (Normal Range: Negative) Ketone:  negative   (Normal Range: Negative) Spec. Gravity: 1.025   (Normal Range: 1.003-1.035) Blood: negative   (Normal Range: Negative) pH: 6.0   (Normal Range: 5.0-8.0) Protein: negative   (Normal Range: Negative) Urobilinogen: 0.2   (Normal Range: 0-1) Nitrite: negative   (Normal Range: Negative) Leukocyte Esterace: trace   (Normal Range: Negative)     Blood Tests     HGBA1C: 5.9%   (Normal Range: Non-Diabetic - 3-6%   Control Diabetic - 6-8%) CBG Random: 130      Appended Document: REASSIGNED NW TO DR.CFB I have discussed the care of this patient in detail with the resident and agree fully with the documentation completed.

## 2010-05-01 NOTE — Miscellaneous (Signed)
Summary: Hospital Admission: 12/22/2009 - Headache  INTERNAL MEDICINE ADMISSION HISTORY AND PHYSICAL ADMISSION DATE: 12/22/2009 ATTENDING: DR. Josem Kaufmann R1 - DR. Heywood Bene - DR. GARG   PCP: DR. ILLATH  CC: HEADACHE  HPI: 55 yo female with PMH outlined below presents to [facility] ED with main concern of left temporal area headache that started 1 week prior to admission and has not been getting much better. She describes it as intermittent, nonradiating, aggrevated by stress (about her financial situation), no specific alleviating factors. She denies fevers but endorses some chills over the past week, denies abdominal or urinary concerns, no recent sicknesses or hospitalizations, no sick contacts or traumas. Also denies and new vision changes but has occasional chronic blurry vision. No weakness, dizziness, no cough, no chest pain, no palpitations, no blood in urine or stool.   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 Laveda Abbe 01/2007 COLON HYPERPLASTIC POLYP, 12/24/07 (Dr. Christella Hartigan) NON CARDIAC CHEST PAIN, 04/2008    - Normal cath, EF 60%  MEDICATIONS: GLUCOPHAGE 500 MG two times a day  ADDAPRIN 200 MG  3-4 tabs as needed SIMVASTATIN 40 MG at bedtime  LOPRESSOR 50 MG bid HYDROCODONE-ACETAMINOPHEN 5-500 MG 1 tabl every 6 hours as needed for pain ACCURETIC 20-25 MG once daily  ASPIRIN 81 MG TBEC once daily  COLACE 100 MG CAPS once daily   SOCIAL HISTORY: Never Smoked. Lives at home with son, currently unemployed. Is separated from husband - still sexually active with him.  FAMILY HISTORY: Mother: stroke at 64 Father: heart attack at 48 Has an aunt that died of colon CA (late 50's-early 79's).  Sister had MI at 74.  ROS: per HPI  VITALS: T:   97.4 F P:  68/min BP:  127/76 mmHg R:  18/min O2SAT:  98% ON: RA  PHYSICAL EXAM: General: NAD, cooperative to examination HEENT: atraumatic, PERRLA, EOMI, no scleral icterus, no oropharyngeal erythema, fair dental care, no postnasal drip Neck: supple, full ROM, no thyroid enlargement Resp: CTAB, good air movement, no wheezing, no ronchi CVS: RRR, S1/S2, no murmurs, no gallops, no rubs, no JVD ABD: soft, NT/ND, BS +, no rebound, no guarding SKIN: good skin turgor, no rashes EXT: ankle edema, L>R, no cyanosis, good DP and PT pulses bilateraly MSK: no joint effusions, no tenderness, full ROM in all extremities NEURO: A & Ox3, strength 5/5 in all 4 extremities and equal, sensation intact to soft touch and position, normal f-to-nose PSYCH: appropriate  LABS:   TCO2                                     30                0-100              Ionized Calcium                          1.05       l      1.12-1.32          Hemoglobin (HGB)  14.3              12.0-15.0          Hematocrit (HCT)                         42.0              36.0-46.0        %  Sodium (NA)                              138               135-145            Potassium (K)                            3.3        l      3.5-5.1            Chloride                                 100               96-112             Glucose                                  140        h      70-99              BUN                                      13                6-23               Creatinine                               0.8               0.4-1.2             WBC                                      10.5              4.0-10.5           Hemoglobin (HGB)                         13.5              12.0-15.0          Hematocrit (HCT)                         38.5              36.0-46.0        %  MCV                                      89.7              78.0-100.0         RDW                                      13.4               11.5-15.5        %  Platelet Count (PLT)                     297               150-400             Color, Urine                             YELLOW            YELLOW  Appearance                               CLOUDY     a      CLEAR  Specific Gravity                         1.010             1.005-1.030  pH                                       5.0               5.0-8.0  Urine Glucose                            NEGATIVE          NEG                Bilirubin                                NEGATIVE          NEG  Ketones                                  NEGATIVE          NEG                Blood                                    NEGATIVE          NEG  Protein  NEGATIVE          NEG                Urobilinogen                             0.2               0.0-1.0            Nitrite                                  NEGATIVE          NEG  Leukocytes                               NEGATIVE          NEG   Head CT:   Heterogeneous lucency throughout the colitis with 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  The   appearance could represent a level or sinus malignancy, or   aggressive infectious process involving the skull base and sphenoid   sinus.  MRI of the brain with without contrast is recommended.  ASSESSMENT AND PLAN:  (1) Headache - unclear etiology but per office chart review this appears to be chronic in nature. Her CT of the head is worrisome for infectious vs malignant process, since pt appears stable clinically, afebrile, no leukocytosis and normotensive i am not convinced of an infectious etiology but we can start course of amoxicillin PO and cont to monitor her clinically. Recommendation is to proceed with MRI of the head for further evaluation but pt refuses at this time and agrees to have it done in outpatient setting.  (2) HLD - cont home regimen (3) DM - check A1C, will cover with SSI. (4) HTN - cont home BP  medications (5) VTE PROPH - lovenox

## 2010-05-01 NOTE — Progress Notes (Signed)
  Phone Note Outgoing Call   Call placed by: Theotis Barrio NT II,  April 14, 2009 3:38 PM Call placed to: Patient Details for Reason: REFERRAL FOR MAMMOGRAM Summary of Call: SPOKE WITH MS Wisenbaker ABOUT MAKING THE MAMMOGRAM APPT / PATIENT STATES THAT SHE IS COMING TO SEE D HILL ON 1/17/011 . BUT FOR ME TO HOLD THE REFERRAL UNTIL HER PAPER WORK WITH MS HILL IS DONE.Shakeel Disney NT II  April 14, 2009 3:40 PM

## 2010-05-01 NOTE — Assessment & Plan Note (Signed)
Summary: [facility]-per dr. Doreen Beam   Vital Signs:  Patient profile:   55 year old female Height:      62 inches (157.48 cm) Weight:      278.4 pounds (126.55 kg) BMI:     51.10 Temp:     96.8 degrees F (36 degrees C) oral Pulse rate:   75 / minute BP sitting:   132 / 88  (right arm)  Vitals Entered By: Stanton Kidney Ditzler RN (July 18, 2009 10:40 AM) Is Patient Diabetic? Yes Did you bring your meter with you today? No Pain Assessment Patient in pain? yes     Location: left knee Intensity: 10 Type: sharp Onset of pain  worse since in hospital Nutritional Status BMI of > 30 = obese Nutritional Status Detail appetite fair CBG Result 141  Have you ever been in a relationship where you felt threatened, hurt or afraid?denies   Does patient need assistance? Functional Status Self care, Social activities Ambulation Normal Comments HFU - Discuss memory from brain surgery and pain left knee. Some problems with depression - mother has been died 3 years - very close. Wants to try a cane.   Primary Care Provider:  Nilda Riggs MD   History of Present Illness: 55 yo female with PMH outlined below presents to [facility] for regular follow up appointment. She has no major concerns at the time. No recent sicknesses or hospitalizaitons. No episodes of chest pain, SOB, palpitations. No specific abdominal or urinary concerns. No recent changes in appetite, weight, sleep patterns, mood. She reports still feeling sad about losing her mother 3 yrs ago and generally she is fine but her mother's birthday would be this past sunday [birth year]/17 and as a result she feels somewhat sad. She denies any SI and has no other problem. In addition she has chronic knee pain for which she has been taking numerous meds and nothing has helped and she has not been seen by ortho since she has no insurance. She tells me about her chronic headaches and h/o prolactinoma but does not want to have any additional work up done.   Depression  History:      The patient denies a depressed mood most of the day and a diminished interest in her usual daily activities.  Positive alarm features for depression include fatigue (loss of energy) and feelings of worthlessness (guilt).  However, she denies significant weight loss, significant weight gain, insomnia, hypersomnia, psychomotor agitation, psychomotor retardation, impaired concentration (indecisiveness), and recurrent thoughts of death or suicide.        The patient denies that she feels like life is not worth living, denies that she wishes that she were dead, and denies that she has thought about ending her life.         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 Take 1 Tablet By Mouth Two Times A Day 2)  Lisinopril 40 Mg  Tabs (Lisinopril) .... Take 1 Tablet By Mouth Once A Day 3)  Addaprin 200 Mg  Tabs (Ibuprofen) .... Take One Tablet 3-4 Times Per Day As Needed For Pain 4)  Simvastatin 40 Mg Tabs (Simvastatin) .... Take 1 Tab By Mouth At Bedtime 5)  Onetouch Test   Strp (Glucose Blood) .... Use To Check Your Blood Sugar Once Daily. 6)  Lopressor 50 Mg Tabs  (Metoprolol Tartrate) .... Take 1 Tablet By Mouth Two Times A Day 7)  Amlodipine Besylate 10 Mg Tabs (Amlodipine Besylate) .... Take 1 Tablet By Mouth Once A Day  Current Medications (verified): 1)  Glucophage 500 Mg  Tabs (Metformin Hcl) .... Take 1 Tablet By Mouth Two Times A Day 2)  Lisinopril 40 Mg  Tabs (Lisinopril) .... Take 1 Tablet By Mouth Once A Day 3)  Addaprin 200 Mg  Tabs (Ibuprofen) .... Take One Tablet 3-4 Times Per Day As Needed For Pain 4)  Simvastatin 40 Mg Tabs (Simvastatin) .... Take 1 Tab By Mouth At Bedtime 5)  Onetouch Test   Strp (Glucose Blood) .... Use To Check Your Blood Sugar Once Daily. 6)  Lopressor 50 Mg Tabs (Metoprolol Tartrate) .... Take 1 Tablet By Mouth Two Times A Day 7)  Amlodipine Besylate 10 Mg Tabs (Amlodipine Besylate) .... Take 1 Tablet By Mouth Once A Day  Allergies (verified): No Known Drug Allergies  Past History:  Past Medical History: Last updated: 07/14/2008 DIABETES MELLITUS, TYPE II HYPERTENSION HYPERLIPIDEMIA OBESITY FATTY LIVER DISEASE L KNEE CPPD with patellar/quad tendon enthesopathy    -  as evidenced by xray 9/08    - attempted joint space aspiration/steroid injection unsuccessful by Dr. Laveda Abbe 01/2007 COLON HYPERPLASTIC POLYP, 12/24/07 (Dr. Christella Hartigan) NON CARDIAC CHEST PAIN, 04/2008    - Normal cath, EF 60%  Past Surgical History: Last updated: 07/14/2008 s/p pituitary tumor resection  Family History: Last updated: 04/20/2008 Mother: stroke at 3 Father: heart attack at 70 Has an aunt that died of colon CA (late 50's-early 3's).  Sister had MI at 80.  Social History: Last updated: 07/14/2008 Never Smoked. Lives at home with son, currently unemployed. Is separated from husband - still sexually active with him.  Risk Factors: Alcohol Use: 0 (07/18/2009) Exercise: yes (07/18/2009)  Risk Factors: Smoking Status: never (07/18/2009)  Family History: Reviewed history from 04/20/2008 and no changes  required. Mother: stroke at 63 Father: heart attack at 73 Has an aunt that died of colon CA (late 50's-early 101's).  Sister had MI at 80.  Social History: Reviewed history from 07/14/2008 and no changes required. Never Smoked. Lives at home with son, currently unemployed. Is separated from husband - still sexually active with him.  Review of Systems       per HPI  Physical Exam  General:  Well-developed,well-nourished,in no acute distress; alert,appropriate and cooperative throughout examination Lungs:  Normal respiratory effort, chest expands symmetrically. Lungs are clear to auscultation, no crackles or wheezes. Heart:  Normal rate and regular rhythm. S1 and S2 normal without gallop, murmur, click, rub or other extra sounds. Abdomen:  Bowel sounds positive,abdomen soft and non-tender without masses, organomegaly or hernias noted. Neurologic:  No cranial nerve deficits noted. Station and gait are normal.  Plantar reflexes are down-going bilaterally. DTRs are symmetrical throughout. Sensory, motor and coordinative functions appear intact. Psych:  Oriented X3, memory intact for recent and remote, depressed affect, poor eye contact, and tearful.    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   Problem # 2:  HEADACHE (ICD-784.0) Well controlled, cont the same regimen. Her updated medication list for this problem includes:    Addaprin 200 Mg Tabs (Ibuprofen) .Marland Kitchen... Take one tablet 3-4 times per day as needed for pain    Lopressor 50 Mg Tabs (Metoprolol tartrate) .Marland Kitchen... Take 1 tablet by mouth two times a day    Percocet 7.5-500 Mg Tabs (Oxycodone-acetaminophen) .Marland Kitchen... Take 1 tablet every 4-6 hours as  needed for pain  Problem # 3:  OSTEOARTHRITIS (ICD-715.90)  Has chronic left knee pain and has not been seen by ortho due to insurance problems. Will refer to family practice sports medicine office.  Will give her percocet for pain and will follow up.  Her updated medication list for this problem includes:    Addaprin 200 Mg Tabs (Ibuprofen) .Marland Kitchen... Take one tablet 3-4 times per day as needed for pain    Percocet 7.5-500 Mg Tabs (Oxycodone-acetaminophen) .Marland Kitchen... Take 1 tablet every 4-6 hours as needed for pain  Orders: Sports Medicine (Sports Med)  Problem # 4:  HYPERLIPIDEMIA (ICD-272.4) Will repeat on her next visit and will readjust the regimen as indicated.  Her updated medication list for this problem includes:    Simvastatin 40 Mg Tabs (Simvastatin) .Marland Kitchen... Take 1 tab by mouth at bedtime  Labs Reviewed: SGOT: 22 (04/14/2009)   SGPT: 20 (04/14/2009)   HDL:56 (04/14/2009), 45 (04/21/2008)  LDL:105 (04/14/2009), 101 (04/21/2008)  Chol:180 (04/14/2009), 160 (04/21/2008)  Trig:95 (04/14/2009), 71 (04/21/2008)  Problem # 5:  HYPERTENSION (ICD-401.9) At her baseline, will continue the same regimen. Her updated medication list for this problem includes:    Lisinopril 40 Mg Tabs (Lisinopril) .Marland Kitchen... Take 1 tablet by mouth once a day    Lopressor 50 Mg Tabs (Metoprolol tartrate) .Marland Kitchen... Take 1 tablet by mouth two times a day    Amlodipine Besylate 10 Mg Tabs (Amlodipine besylate) .Marland Kitchen... Take 1 tablet by mouth once a day  BP today: 132/88 Prior BP: 134/87 (04/26/2009)  Labs Reviewed: K+: 4.7 (04/14/2009) Creat: : 0.62 (04/14/2009)   Chol: 180 (04/14/2009)   HDL: 56 (04/14/2009)   LDL: 105 (04/14/2009)   TG: 95 (04/14/2009)  Problem # 6:  DIABETES MELLITUS, TYPE II (ICD-250.00) A1c stable, will cont. the same regimen. Her updated medication list for this problem includes:    Glucophage 500 Mg Tabs (Metformin hcl) .Marland Kitchen... Take 1 tablet by mouth two times a day    Lisinopril 40 Mg Tabs (Lisinopril)  .Marland Kitchen... Take 1 tablet by mouth once a day  Orders: T- Capillary Blood Glucose (98119) T-Hgb A1C (in-house) (14782NF)  Labs Reviewed: Creat: 0.62 (04/14/2009)     Last Eye Exam: No diabetic retinopathy.   Visual acuity OD (best corrected): 20/50 Visual acuity OS (best corrected): 20/30  (07/27/2008) Reviewed HgBA1c results: 6.3 (07/18/2009)  6.4 (04/14/2009)  Problem #  7:  DEPRESSION, MILD (ICD-311) Will start her on zoloft and will follow up. Her updated medication list for this problem includes:    Zoloft 100 Mg Tabs (Sertraline hcl) .Marland Kitchen... Take 1 tablet by mouth once a day  Complete Medication List: 1)  Glucophage 500 Mg Tabs (Metformin hcl) .... Take 1 tablet by mouth two times a day 2)  Lisinopril 40 Mg Tabs (Lisinopril) .... Take 1 tablet by mouth once a day 3)  Addaprin 200 Mg Tabs (Ibuprofen) .... Take one tablet 3-4 times per day as needed for pain 4)  Simvastatin 40 Mg Tabs (Simvastatin) .... Take 1 tab by mouth at bedtime 5)  Onetouch Test Strp (Glucose blood) .... Use to check your blood sugar once daily. 6)  Lopressor 50 Mg Tabs (Metoprolol tartrate) .... Take 1 tablet by mouth two times a day 7)  Amlodipine Besylate 10 Mg Tabs (Amlodipine besylate) .... Take 1 tablet by mouth once a day 8)  Percocet 7.5-500 Mg Tabs (Oxycodone-acetaminophen) .... Take 1 tablet every 4-6 hours as needed for pain 9)  Zoloft 100 Mg Tabs (Sertraline hcl) .... Take 1 tablet by mouth once a day  Patient Instructions: 1)  Please schedule a follow-up appointment in 3 months. 2)  Please check your blood pressure regularly, if it is >170 please call clinic at [phone] 3)  Please check your sugar levels regularly and remember to bring the meter with you to the next clinic appointment, if the sugars are > 350 or < 60 please call us at [phone] Prescriptions: ZOLOFT 100 MG TABS (SERTRALINE HCL) Take 1 tablet by mouth once a day  #30 x 3   Entered and Authorized by:   Mliss Sax MD   Signed by:    Mliss Sax MD on 07/18/2009   Method used:   Print then Give to Patient   RxID:   [id number] PERCOCET 7.5-500 MG TABS (OXYCODONE-ACETAMINOPHEN) take 1 tablet every 4-6 hours as needed for pain  #60 x 0   Entered and Authorized by:   Mliss Sax MD   Signed by:   Mliss Sax MD on 07/18/2009   Method used:   Print then Give to Patient   RxID:   [id number]    Diabetic Foot Exam Last Podiatry Exam Date: 07/18/2009  Foot Inspection Is there a history of a foot ulcer?              No Is there a foot ulcer now?              No Can the patient see the bottom of their feet?          No Are the shoes appropriate in style and fit?          No Is there swelling or an abnormal foot shape?          No Are the toenails long?                No Are the toenails thick?                No Are the toenails ingrown?              No Is there heavy callous build-up?              No Is there pain in the calf muscle (Intermittent claudication) when walking?    NoIs there a claw toe deformity?  No Is there elevated skin temperature?            No Is there limited ankle dorsiflexion?            No Is there foot or ankle muscle weakness?            No  Diabetic Foot Care Education Patient educated on appropriate care of diabetic feet.   High Risk Feet? No   10-g (5.07) Semmes-Weinstein Monofilament Test           Right Foot          Left Foot Visual Inspection     normal           normal Test Control      normal         normal Site 1         normal         normal Site 2         normal         normal Site 3         normal         normal Site 4         normal         normal Site 5         normal         normal Site 6         normal         normal Site 7         normal         normal Site 8         normal         normal Site 9         normal         normal Site 10         normal         normal  Impression      normal         normal   Prevention & Chronic  Care Immunizations   Influenza vaccine: Not documented   Influenza vaccine deferral: Not indicated  (07/18/2009)    Tetanus booster: Not documented   Td booster deferral: Deferred  (07/18/2009)    Pneumococcal vaccine: Not documented   Pneumococcal vaccine deferral: Refused  (04/14/2009)  Colorectal Screening   Hemoccult: Not documented   Hemoccult action/deferral: Refused  (07/18/2009)    Colonoscopy: Location:  [facility].    (12/23/2007)   Colonoscopy action/deferral: patient refused  (05/07/2006)   Colonoscopy due: 11/2017  Other Screening   Pap smear: Not documented   Pap smear action/deferral: Deferred  (07/18/2009)    Mammogram: No specific mammographic evidence of malignancy.  Assessment: BIRADS 1.   (05/29/2009)   Mammogram action/deferral: Screening mammogram in 1 year.     (05/29/2009)   Smoking status: never  (07/18/2009)  Diabetes Mellitus   HgbA1C: 6.3  (07/18/2009)    Eye exam: No diabetic retinopathy.   Visual acuity OD (best corrected): 20/50 Visual acuity OS (best corrected): 20/30   (07/27/2008)    Foot exam: yes  (07/18/2009)   Foot exam action/deferral: Do today   High risk foot: No  (07/18/2009)   Foot care education: Done  (07/18/2009)    Urine microalbumin/creatinine ratio: 8.1  (04/14/2009)    Diabetes flowsheet reviewed?: Yes   Progress toward A1C goal: At goal  Lipids   Total Cholesterol: 180  (  04/14/2009)   LDL: 105  (04/14/2009)   LDL Direct: Not documented   HDL: 56  (04/14/2009)   Triglycerides: 95  (04/14/2009)    SGOT (AST): 22  (04/14/2009)   SGPT (ALT): 20  (04/14/2009)   Alkaline phosphatase: 42  (04/14/2009)   Total bilirubin: 0.4  (04/14/2009)    Lipid flowsheet reviewed?: Yes   Progress toward LDL goal: Unchanged  Hypertension   Last Blood Pressure: 132 / 88  (07/18/2009)   Serum creatinine: 0.62  (04/14/2009)   Serum potassium 4.7  (04/14/2009)    Hypertension flowsheet reviewed?: Yes    Progress toward BP goal: At goal  Self-Management Support :   Personal Goals (by the next clinic visit) :     Personal A1C goal: 7  (04/26/2009)     Personal blood pressure goal: 130/80  (04/26/2009)     Personal LDL goal: 100  (04/26/2009)    Patient will work on the following items until the next clinic visit to reach self-care goals:     Medications and monitoring: take my medicines every day, examine my feet every day  (07/18/2009)     Eating: drink diet soda or water instead of juice or soda, eat more vegetables, use fresh or frozen vegetables, eat foods that are low in salt, eat baked foods instead of fried foods, eat fruit for snacks and desserts, limit or avoid alcohol  (07/18/2009)     Activity: park at the far end of the parking lot  (07/18/2009)     Other: walk to mail box or store once a week  (11/23/2008)    Diabetes self-management support: Written self-care plan, Education handout, Resources for patients handout  (07/18/2009)   Diabetes care plan printed   Diabetes education handout printed    Hypertension self-management support: Written self-care plan, Education handout, Resources for patients handout  (07/18/2009)   Hypertension self-care plan printed.   Hypertension education handout printed    Lipid self-management support: Written self-care plan, Education handout, Resources for patients handout  (07/18/2009)   Lipid self-care plan printed.   Lipid education handout printed      Resource handout printed.   Nursing Instructions: Diabetic foot exam today   Laboratory Results   Blood Tests   Date/Time Received: July 18, 2009 10:51 AM  Date/Time Reported: Burke Keels  July 18, 2009 10:51 AM   HGBA1C: 6.3%   (Normal Range: Non-Diabetic - 3-6%   Control Diabetic - 6-8%) CBG Random:: 141mg /dL

## 2010-05-03 ENCOUNTER — Other Ambulatory Visit: Payer: Self-pay | Admitting: Internal Medicine

## 2010-05-03 NOTE — Assessment & Plan Note (Signed)
Summary: eye irritation/gg   Vital Signs:  Patient profile:   55 year old female Height:      62 inches (157.48 cm) Weight:      271.3 pounds (123.32 kg) BMI:     49.80 O2 Sat:      97 % on Room air Temp:     96.6 degrees F (35.89 degrees C) oral Pulse rate:   67 / minute BP sitting:   145 / 97  (left arm)  Vitals Entered By: Stanton Kidney Ditzler RN (April 12, 2010 2:34 PM)  O2 Flow:  Room air Is Patient Diabetic? Yes Did you bring your meter with you today? No Pain Assessment Patient in pain? yes     Location: eyes Intensity: 10 Type: burning Onset of pain  past day Nutritional Status BMI of > 30 = obese Nutritional Status Detail appetite down CBG Result 169  Have you ever been in a relationship where you felt threatened, hurt or afraid?denies   Does patient need assistance? Functional Status Self care Ambulation Normal Comments Eyes burning past day. Past 3 days nonproductive cough, h/a, runny nose and ears hurt. Went to ER 04/09/10.   Primary Care Provider:  Almyra Deforest MD   History of Present Illness: 55yo W presents with complaints of bilateral eye redness, irritation, itching, and discomfort. Three days ago, she began experiencing a runny nose and dry cough; her cousin was recently ill with similar cold symptoms. The cough has been quite bothersome, particularly at night, making it difficult for her to sleep. Yesterday, she began experiencing bilateral eye eye redness and irritation. Both eyes are itchy, burning, and uncomfortable. She has noted significant tearing of both eyes for the past day; she denies any thick, cloudy, or purulent eye discharge or sensation of her eyes sticking together when she wakes up. She denies fever/chills, vision changes, nausea/vomiting, or other systemic symptoms.    Depression History:      The patient denies a depressed mood most of the day and a diminished interest in her usual daily activities.         Preventive  Screening-Counseling & Management  Alcohol-Tobacco     Alcohol drinks/day: 0     Smoking Status: never  Caffeine-Diet-Exercise     Does Patient Exercise: yes     Type of exercise: WALKING     Times/week: <3  Current Medications (verified): 1)  Glucophage 500 Mg  Tabs (Metformin Hcl) .... Take 1 Tablet By Mouth Once A Day 2)  Simvastatin 40 Mg Tabs (Simvastatin) .... Take 1 Tab By Mouth At Bedtime 3)  Lopressor 50 Mg Tabs (Metoprolol Tartrate) .... Take 1 Tablet By Mouth Two Times A Day 4)  Accuretic 20-25 Mg Tabs (Quinapril-Hydrochlorothiazide) .... Take 1 Tablet By Mouth Once A Day 5)  Aspirin 81 Mg Tbec (Aspirin) .... Take 1 Tablet By Mouth Once A Day 6)  Colace 100 Mg Caps (Docusate Sodium) .... Take One Tablet By Mouth Once A Day Prn 7)  Anusol-Hc 2.5 % Crea (Hydrocortisone) .Marland Kitchen.. 1 Applicator Full Pr Bid For 1 Week and Then Stop 8)  Tucks 50 % Pads [facility]) .... Use As Needed in The Affected Area 9)  Fluoxetine Hcl 20 Mg Caps (Fluoxetine Hcl) .... Take One Tablet By Mouth Daily For Depression 10)  Eye Allergy Relief 0.025-0.3 % Soln (Naphazoline-Pheniramine) .... 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 Laveda Abbe 01/2007 COLON HYPERPLASTIC POLYP, 12/24/07 (Dr. Christella Hartigan) NON CARDIAC CHEST PAIN, 04/2008    - Normal cath, EF 60%  Family History: Last updated: 04/20/2008 Mother: stroke at 56 Father: heart attack at 56 Has an aunt that died of colon CA (late 50's-early  40's).  Sister had MI at 70.  Social History: Never Smoked. Denies alcohol or illicit drug use.  Lives at home with son, currently unemployed. Is separated from husband - still sexually active with him.  Review of Systems      See HPI General:  Complains of malaise; denies chills and fever. Eyes:  Complains of eye irritation, itching, and red eye; denies blurring, discharge, and eye pain. ENT:  Complains of earache, nasal congestion, and postnasal drainage; denies decreased hearing, ear discharge, and sore throat. CV:  Denies chest pain or discomfort and swelling of feet. Resp:  Complains of cough; denies shortness of breath, sputum productive, and wheezing. GI:  Denies abdominal pain and change in bowel habits. Derm:  Denies rash. Allergy:  Complains of itching eyes; denies sneezing.  Physical Exam  General:  alert and cooperative to examination.   Head:  normocephalic and atraumatic.   Eyes:  vision grossly intact, pupils equal, pupils round, pupils reactive to light, bilateral conjunctival injection, and excessive tearing. No purulent drainage. vision grossly intact, pupils equal, pupils round, pupils reactive to light, corneas and lenses clear, no optic disk abnormalities, no retinal abnormalitiies, conjunctival injection, and excessive tearing.   Ears:  R ear normal and L ear normal.   Nose:  no external deformity, no nasal discharge, and no sinus percussion tenderness.   Mouth:  no exudates and mild pharyngeal erythema.   Neck:  supple and no masses.   Lungs:  normal respiratory effort, normal breath sounds, no crackles, and no wheezes.   Heart:  normal rate, regular rhythm, no murmur, no gallop, and no rub.   Abdomen:  soft, non-tender, and no distention.   Extremities:  No edema.  Neurologic:  alert & oriented X3 and cranial nerves grossly intact.   Skin:  turgor normal and no rashes.   Cervical Nodes:  no anterior cervical adenopathy and no posterior cervical adenopathy.     Psych:  Oriented X3, memory intact for recent and remote, normally interactive, good eye contact, not anxious appearing, and not depressed appearing.    Diabetes Management Exam:    Foot Exam (with socks and/or shoes not present):       Sensory-Monofilament:          Left foot: normal          Right foot: normal   Impression & Recommendations:  Problem # 1:  CONJUNCTIVITIS, VIRAL (ICD-077.99) Assessment New Symptoms of eye irritation, itching, and clear tearing of eyes following onset of viral URI symptoms highly suggestive of viral conjunctivitis. No purulent drainage to suggest bacterial conjunctivitis. Will prescribe antihistamine eye drops to use no more than 3 weeks. Patient instructed to call the clinic if her eyes do not improve over the next 1-2 weeks of if she develops purulent drainage, fever, or vision changes. If she  develops any purulent drainage, we could call in a prescription for an antibiotic ointment.   Problem # 2:  UPPER RESPIRATORY INFECTION, VIRAL (ICD-465.9) Assessment: New Cough and runny nose for the past few days and family member with similar symptoms highly suggestive of common cold. Patient had flu vaccine this year. Will prescribe Cherattusin to treat cough at night. Recommend over-the-counter cold medicines for symptom relief during the day as well as drinking plenty of fluids and rest.   Her updated medication list for this problem includes:    Aspirin 81 Mg Tbec (Aspirin) .Marland Kitchen... Take 1 tablet by mouth once a day    Cheratussin Ac 100-10 Mg/62ml Syrp (Guaifenesin-codeine) .Marland Kitchen... Take 5ml by mouth at bedtime as needed for cough  Problem # 3:  DIABETES MELLITUS, TYPE II (ICD-250.00) Well controlled. Continue current management.   Her updated medication list for this problem includes:    Glucophage 500 Mg Tabs (Metformin hcl) .Marland Kitchen... Take 1 tablet by mouth once a day    Accuretic 20-25 Mg Tabs (Quinapril-hydrochlorothiazide) .Marland Kitchen... Take 1 tablet by mouth once a  day    Aspirin 81 Mg Tbec (Aspirin) .Marland Kitchen... Take 1 tablet by mouth once a day  Complete Medication List: 1)  Glucophage 500 Mg Tabs (Metformin hcl) .... Take 1 tablet by mouth once a day 2)  Simvastatin 40 Mg Tabs (Simvastatin) .... Take 1 tab by mouth at bedtime 3)  Lopressor 50 Mg Tabs (Metoprolol tartrate) .... Take 1 tablet by mouth two times a day 4)  Accuretic 20-25 Mg Tabs (Quinapril-hydrochlorothiazide) .... Take 1 tablet by mouth once a day 5)  Aspirin 81 Mg Tbec (Aspirin) .... Take 1 tablet by mouth once a day 6)  Colace 100 Mg Caps (Docusate sodium) .... Take one tablet by mouth once a day prn 7)  Anusol-hc 2.5 % Crea (Hydrocortisone) .Marland Kitchen.. 1 applicator full pr bid for 1 week and then stop 8)  Tucks 50 % Pads (Witch hazel) .... Use as needed in the affected area 9)  Fluoxetine Hcl 20 Mg Caps (Fluoxetine hcl) .... Take one tablet by mouth daily for depression 10)  Eye Allergy Relief 0.025-0.3 % Soln (Naphazoline-pheniramine) .... Apply 1-2 drops to eyes four times per day as needed for no more than 3  weeks 11)  Cheratussin Ac 100-10 Mg/10ml Syrp (Guaifenesin-codeine) .... Take 5ml by mouth at bedtime as needed for cough  Other Orders: Capillary Blood Glucose/CBG (13244)  Patient Instructions: 1)  Please follow-up with your previously scheduled appointment with Dr. Loistine Chance on January 30th.  2)  If you develop fever or thick drainage from your eyes or your symptoms fail to improve or get worse over the next several days, please call the clinic. 3)  I have sent a prescription for an eye drop to the [facility] on [address]. Use 1-2 drops in each eye up to four times daily for relief of eye irritation but do not use for more than 3 weeks.  4)  I have prescribed a strong cough syrup to use at night to relieve cough. It can make you drowsy, however; during the day, I recommend an over the counter cough or cold medicine like Robitussin (store brands are fine).  Prescriptions: CHERATUSSIN AC  100-10 MG/5ML SYRP (GUAIFENESIN-CODEINE) Take 5mL by mouth at bedtime as needed for cough  #1 bottle x 0   Entered and Authorized by:   Whitney Post MD   Signed by:   Whitney Post MD on 04/12/2010   Method used:  Print then Give to Patient   RxID:   [id number] EYE ALLERGY RELIEF 0.025-0.3 % SOLN (NAPHAZOLINE-PHENIRAMINE) Apply 1-2 drops to eyes four times per day as needed for no more than 3  weeks  #1 bottle x 0   Entered and Authorized by:   Whitney Post MD   Signed by:   Whitney Post MD on 04/12/2010   Method used:   Electronically to        [facility] (retail)       [address]       [place], [place]  [address]       Ph: [phone]       Fax: [phone]   RxID:   [id number]    Orders Added: 1)  Capillary Blood Glucose/CBG [82948] 2)  Est. Patient Level IV [64403]     Prevention & Chronic Care Immunizations   Influenza vaccine: Fluvax Non-MCR  (03/07/2010)   Influenza vaccine deferral: Not indicated  (07/18/2009)   Influenza vaccine due: 12/01/2010    Tetanus booster: 03/07/2010: Tdap   Td booster deferral: Deferred  (10/09/2009)   Tetanus booster due: 03/07/2020    Pneumococcal vaccine: Pneumovax  (03/07/2010)   Pneumococcal vaccine deferral: Deferred  (10/09/2009)   Pneumococcal vaccine due: 03/12/2020  Colorectal Screening   Hemoccult: Not documented   Hemoccult action/deferral: Refused  (07/18/2009)    Colonoscopy: Location:  [facility].    (12/23/2007)   Colonoscopy action/deferral: patient refused  (05/07/2006)   Colonoscopy due: 11/2017  Other Screening   Pap smear: Not documented   Pap smear action/deferral: Deferred  (07/18/2009)    Mammogram: No specific mammographic evidence of malignancy.  Assessment: BIRADS 1.   (05/29/2009)   Mammogram action/deferral: Screening mammogram in 1 year.     (05/29/2009)   Mammogram due: 05/29/2010   Smoking status: never  (04/12/2010)  Diabetes  Mellitus   HgbA1C: 6.4  (03/07/2010)   Hemoglobin A1C due: 10/10/2010    Eye exam: No diabetic retinopathy.   Visual acuity OD (best corrected): 20/50 Visual acuity OS (best corrected): 20/30   (07/27/2008)   Diabetic eye exam action/deferral: Ophthalmology referral  (03/07/2010)   Eye exam due: 07/27/2009    Foot exam: yes  (04/12/2010)   Foot exam action/deferral: Do today   High risk foot: Yes  (04/12/2010)   Foot care education: Done  (07/18/2009)   Foot exam due: 10/10/2010    Urine microalbumin/creatinine ratio: 8.1  (04/14/2009)   Urine microalbumin/cr due: 04/14/2010    Diabetes flowsheet reviewed?: Yes   Progress toward A1C goal: At goal  Lipids   Total Cholesterol: 148  (10/09/2009)   LDL: 75  (10/09/2009)   LDL Direct: Not documented   HDL: 54  (10/09/2009)   Triglycerides: 94  (10/09/2009)   Lipid panel due: 10/10/2010    SGOT (AST): 17  (10/09/2009)   SGPT (ALT): 13  (10/09/2009)   Alkaline phosphatase: 46  (10/09/2009)   Total bilirubin: 0.3  (10/09/2009)   Liver panel due: 10/10/2010    Lipid flowsheet reviewed?: Yes   Progress toward LDL goal: At goal  Hypertension   Last Blood Pressure: 145 / 97  (04/12/2010)   Serum creatinine: 0.84  (10/09/2009)   Serum potassium 4.5  (10/09/2009)   Basic metabolic panel due: 10/10/2010    Hypertension flowsheet reviewed?: Yes   Progress toward BP goal: Unchanged  Self-Management Support :   Personal Goals (by the next clinic visit) :  Personal A1C goal: 7  (04/26/2009)     Personal blood pressure goal: 130/80  (04/26/2009)     Personal LDL goal: 100  (04/26/2009)    Patient will work on the following items until the next clinic visit to reach self-care goals:     Medications and monitoring: take my medicines every day, bring all of my medications to every visit, examine my feet every day  (04/12/2010)     Eating: drink diet soda or water instead of juice or soda, eat more vegetables, use fresh or frozen  vegetables, eat foods that are low in salt, eat baked foods instead of fried foods, eat fruit for snacks and desserts, limit or avoid alcohol  (04/12/2010)     Activity: park at the far end of the parking lot  (04/12/2010)     Other: walk to mail box or store once a week  (11/23/2008)    Diabetes self-management support: Written self-care plan, Education handout, Resources for patients handout  (04/12/2010)   Diabetes care plan printed   Diabetes education handout printed    Hypertension self-management support: Written self-care plan, Education handout, Resources for patients handout  (04/12/2010)   Hypertension self-care plan printed.   Hypertension education handout printed    Lipid self-management support: Written self-care plan, Education handout, Resources for patients handout  (04/12/2010)   Lipid self-care plan printed.   Lipid education handout printed      Resource handout printed.   Last LDL:                                                 75 (10/09/2009 10:02:00 PM)        Diabetic Foot Exam Foot Inspection Is there a history of a foot ulcer?              No Is there a foot ulcer now?              No Can the patient see the bottom of their feet?          Yes Are the shoes appropriate in style and fit?          Yes Is there swelling or an abnormal foot shape?          No Are the toenails long?                No Are the toenails thick?                No Are the toenails ingrown?              No Is there heavy callous build-up?              No Is there a claw toe deformity?                          No Is there elevated skin temperature?            No Is there limited ankle dorsiflexion?            No Is there foot or ankle muscle weakness?            No Do you have pain in calf while walking?           No  Pulse Check          Right Foot          Left Foot Posterior Tibial:        2+            2+ Dorsalis Pedis:        2+            2+  High Risk Feet?  Yes   10-g (5.07) Semmes-Weinstein Monofilament Test Performed by: Stanton Kidney Ditzler RN          Right Foot          Left Foot Visual Inspection     normal         normal Test Control      normal         normal Site 1         normal         normal Site 2         normal         normal Site 3         normal         normal Site 4         normal         normal Site 5         normal         normal Site 6         normal         normal Site 7         normal         normal Site 8         normal         normal Site 9         normal         normal Site 10         normal         normal  Impression      normal         normal

## 2010-05-03 NOTE — Progress Notes (Signed)
Summary: phone/gg  Phone Note Call from Patient   Caller: Patient Summary of Call: Pt called with c/o cold, bilateral eye redness X 2 days. Eyes itch and burn.  Non-productive cough.   Denies N/V/D   Will see this PM Initial call taken by: Merrie Roof RN,  April 12, 2010 10:00 AM

## 2010-05-09 NOTE — Assessment & Plan Note (Signed)
Summary: EST-F/U VISIT AND PAP/CH   Vital Signs:  Patient profile:   55 year old female Height:      62 inches (157.48 cm) Weight:      266.9 pounds (121.32 kg) BMI:     48.99 Temp:     96.9 degrees F oral Pulse rate:   61 / minute BP sitting:   129 / 77  (right arm) Cuff size:   regular  Vitals Entered By: Chinita Pester RN (April 30, 2010 2:17 PM) CC: F/u visit. Felt a "knot"  left side of stomach about a week ago. PAP smear. , Lipid Management Is Patient Diabetic? Yes Did you bring your meter with you today? No Research Study Name: She does not have a meter. Pain Assessment Patient in pain? no      Nutritional Status BMI of > 30 = obese CBG Result 170  Have you ever been in a relationship where you felt threatened, hurt or afraid?No   Does patient need assistance? Functional Status Self care Ambulation Normal   Primary Care Provider:  Almyra Deforest MD  CC:  F/u visit. Felt a "knot"  left side of stomach about a week ago. PAP smear.  and Lipid Management.  History of Present Illness: This is 55 year old female with PMH significant for HTN, DM , HLD who presented to the clinic for  a follow but noted that she noticed a "knot" found on the Left lower abdomen.  1. HTN: taking her medication. BP at goal with 129/77 2. DM: taking her medicaiton. Weight loss of 4 pounds  . Hgb A1c 6.4 in 03/2010 3. HLD: at goal. Tolerating Statin well. 4. Depression: stable. She is worried how to pay her bills. She needs to follow up with [facility]. 5. Patient noted a lump in her LLQ area: no redness, no pain, just accidently found . 6. Gyn: since s/p removal of pitutary tumor in 06/2008 patient had no further episodes of bleeding.  7. Hx of pitutary tumor: Denies any vision changes, just occasionally headache. Will closely monitor.    Depression History:      The patient denies a depressed mood most of the day and a diminished interest in her usual daily activities.        The patient  denies that she feels like life is not worth living, denies that she wishes that she were dead, and denies that she has thought about ending her life.        Comments:  "Worry about how bills going to be paid.".  Lipid Management History:      Positive NCEP/ATP III risk factors include female age 61 years old or older, diabetes, and hypertension.  Negative NCEP/ATP III risk factors include non-tobacco-user status.       Preventive Screening-Counseling & Management  Alcohol-Tobacco     Alcohol drinks/day: 0     Smoking Status: never  Caffeine-Diet-Exercise     Does Patient Exercise: yes     Type of exercise: WALKING     Times/week: <3  Current Medications (verified): 1)  Glucophage 500 Mg  Tabs (Metformin Hcl) .... Take 1 Tablet By Mouth Once A Day 2)  Simvastatin 40 Mg Tabs (Simvastatin) .... Take 1 Tab By Mouth At Bedtime 3)  Lopressor 50 Mg Tabs (Metoprolol Tartrate) .... Take 1 Tablet By Mouth Two Times A Day 4)  Accuretic 20-25 Mg Tabs (Quinapril-Hydrochlorothiazide) .... Take 1 Tablet By Mouth Once A Day 5)  Miralax  Powd (Polyethylene  Glycol 3350) .Marland KitchenMarland KitchenMarland Kitchen 17 Gram By Mouth Daily 6)  Anusol-Hc 2.5 % Crea (Hydrocortisone) .Marland Kitchen.. 1 Applicator Full Pr Bid For 1 Week and Then Stop 7)  Fluoxetine Hcl 20 Mg Caps (Fluoxetine Hcl) .... Take One Tablet By Mouth Daily For Depression 8)  Eye Allergy Relief 0.025-0.3 % Soln (Naphazoline-Pheniramine) .... Apply 1-2 Drops To Eyes Four Times Per Day As Needed For No More Than 3  Weeks  Allergies: No Known Drug Allergies  Family History: Mother: stroke at 80 Father: heart attack at 79 Has an aunt that died of colon CA (late 50's-early 50's). Sister had MI at 41.  Review of Systems       The patient complains of headaches.  The patient denies fever, vision loss, decreased hearing, chest pain, dyspnea on exertion, peripheral edema, difficulty walking, abnormal bleeding, and breast masses.    Physical Exam  General:   Well-developed,well-nourished,in no acute distress; alert,appropriate and cooperative throughout examination Head:  Normocephalic and atraumatic without obvious abnormalities. No apparent alopecia or balding. Lungs:  Normal respiratory effort, chest expands symmetrically. Lungs are clear to auscultation, no crackles or wheezes. Heart:  Normal rate and regular rhythm. S1 and S2 normal without gallop, murmur, click, rub or other extra sounds. Abdomen:  soft, non-tender, normal bowel sounds, no distention, no masses, no guarding, no rigidity, no rebound tenderness, and no abdominal hernia.   Extremities:  No clubbing, cyanosis, edema, or deformity noted with normal full range of motion of all joints.   Skin:  turgor normal, color normal, no rashes, and no suspicious lesions.     Impression & Recommendations:  Problem # 1:  ABDOMINAL OR PELVIC SWELLING MASS OR LUMP LLQ (ICD-789.34) I was not able to appreciate any mass or lump on the LLQ. There was no changes in skin color present. This may be a lipoma. Will continue to monitor.   Problem # 2:  Screening Cervical Cancer (ICD-V76.2) Performed a pap smear. Patient had no documented pap smear in our clinic. Patient noted that since 06/2008 s/p pituitary tumor removal she had no bleeding. She is still sexually acitve with her husband. Will f/u on results manage accordingly  Problem # 3:  HYPERTENSION (ICD-401.9) BP stable. Will continue on current regimen.  Her updated medication list for this problem includes:    Lopressor 50 Mg Tabs (Metoprolol tartrate) .Marland Kitchen... Take 1 tablet by mouth two times a day    Accuretic 20-25 Mg Tabs (Quinapril-hydrochlorothiazide) .Marland Kitchen... Take 1 tablet by mouth once a day  Orders: T-Basic Metabolic Panel [phone])  Problem # 4:  DIABETES MELLITUS, TYPE II (ICD-250.00) Hgb A1c 6.4 in 03/2010. Will continue current regimen. Patient's last visit to an ophthalmologist was in 2010. Patient need to have to follow up. But  patient has no insurance at this point. She is trying to get in touch with [facility] and may need to see SW to help her with her financial status.  The following medications were removed from the medication list:    Aspirin 81 Mg Tbec (Aspirin) .Marland Kitchen... Take 1 tablet by mouth once a day Her updated medication list for this problem includes:    Glucophage 500 Mg Tabs (Metformin hcl) .Marland Kitchen... Take 1 tablet by mouth once a day    Accuretic 20-25 Mg Tabs (Quinapril-hydrochlorothiazide) .Marland Kitchen... Take 1 tablet by mouth once a day  Orders: Capillary Blood Glucose/CBG (18841)  Problem # 5:  HYPERLIPIDEMIA (ICD-272.4) Labs reviewed. Lipids at goal. Will continue on current regimen.  Her updated medication list  for this problem includes:    Simvastatin 40 Mg Tabs (Simvastatin) .Marland Kitchen... 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 Should be evaluated at the next office visit in detail.  Complete Medication List: 1)  Glucophage 500 Mg Tabs (Metformin hcl) .... Take 1 tablet by mouth once a day 2)  Simvastatin 40 Mg Tabs (Simvastatin) .... Take 1 tab by mouth at bedtime 3)  Lopressor 50 Mg Tabs (Metoprolol tartrate) .... Take 1 tablet by mouth two times a day 4)  Accuretic 20-25 Mg Tabs (Quinapril-hydrochlorothiazide) .... Take 1 tablet by mouth once a day 5)  Miralax Powd (Polyethylene glycol 3350) .Marland KitchenMarland KitchenMarland Kitchen 17 gram by mouth daily 6)  Anusol-hc 2.5 % Crea (Hydrocortisone) .Marland Kitchen.. 1 applicator full pr bid for 1 week and then stop 7)  Fluoxetine Hcl 20 Mg Caps (Fluoxetine hcl) .... Take one tablet by mouth daily for depression 8)  Eye Allergy Relief 0.025-0.3 % Soln (Naphazoline-pheniramine) .... Apply 1-2 drops to eyes four times per day as needed for no more than 3  weeks  Other Orders: T-PAP [facility]) [phone])  Lipid Assessment/Plan:      Based on NCEP/ATP III, the patient's risk factor category is "history of  diabetes".  The patient's lipid goals are as follows: Total cholesterol goal is 200; LDL cholesterol goal is 100; HDL cholesterol goal is 40; Triglyceride goal is 150.  Her LDL cholesterol goal has been met.    Patient Instructions: 1)  Please schedule a follow-up appointment in 3 months. 2)  It is important that you exercise regularly at least 20 minutes 5 times a week. If you develop chest pain, have severe difficulty breathing, or feel very tired , stop exercising immediately and seek medical attention. 3)  Check your Blood Pressure regularly. If it is above: you should make an appointment. 4)  See your eye doctor yearly to check for diabetic eye damage.   Orders Added: 1)  Capillary Blood Glucose/CBG [82948] 2)  T-Basic Metabolic Panel [[id number]] 3)  T-PAP [facility]) [88142] 4)  Est. Patient Level IV [60454]    Prevention & Chronic Care Immunizations   Influenza vaccine: Fluvax Non-MCR  (03/07/2010)   Influenza vaccine deferral: Not indicated  (07/18/2009)   Influenza vaccine due: 12/01/2010    Tetanus booster: 03/07/2010: Tdap   Td booster deferral: Deferred  (10/09/2009)   Tetanus booster due: 03/07/2020    Pneumococcal vaccine: Pneumovax  (03/07/2010)   Pneumococcal vaccine deferral: Deferred  (10/09/2009)   Pneumococcal vaccine due: 03/12/2020  Colorectal Screening   Hemoccult: Not documented   Hemoccult action/deferral: Refused  (07/18/2009)    Colonoscopy: Location:  [facility].    (12/23/2007)   Colonoscopy action/deferral: patient refused  (05/07/2006)   Colonoscopy due: 11/2017  Other Screening   Pap smear: Not documented   Pap smear action/deferral: Ordered  (04/30/2010)    Mammogram: No specific mammographic evidence of malignancy.  Assessment: BIRADS 1.   (05/29/2009)   Mammogram action/deferral: Screening mammogram in 1 year.     (05/29/2009)   Mammogram due: 05/29/2010   Smoking status: never  (04/30/2010)  Diabetes Mellitus    HgbA1C: 6.4  (03/07/2010)   Hemoglobin A1C due: 10/10/2010    Eye exam: No diabetic retinopathy.   Visual acuity OD (best corrected): 20/50 Visual acuity OS (best corrected): 20/30   (07/27/2008)   Diabetic eye exam action/deferral: Ophthalmology referral  (03/07/2010)   Eye exam  due: 07/27/2009    Foot exam: yes  (04/12/2010)   Foot exam action/deferral: Do today   High risk foot: Yes  (04/12/2010)   Foot care education: Done  (07/18/2009)   Foot exam due: 10/10/2010    Urine microalbumin/creatinine ratio: 8.1  (04/14/2009)   Urine microalbumin/cr due: 04/14/2010  Lipids   Total Cholesterol: 148  (10/09/2009)   LDL: 75  (10/09/2009)   LDL Direct: Not documented   HDL: 54  (10/09/2009)   Triglycerides: 94  (10/09/2009)   Lipid panel due: 10/10/2010    SGOT (AST): 17  (10/09/2009)   SGPT (ALT): 13  (10/09/2009)   Alkaline phosphatase: 46  (10/09/2009)   Total bilirubin: 0.3  (10/09/2009)   Liver panel due: 10/10/2010  Hypertension   Last Blood Pressure: 129 / 77  (04/30/2010)   Serum creatinine: 0.84  (10/09/2009)   Serum potassium 4.5  (10/09/2009)   Basic metabolic panel due: 10/10/2010  Self-Management Support :   Personal Goals (by the next clinic visit) :     Personal A1C goal: 7  (04/26/2009)     Personal blood pressure goal: 130/80  (04/26/2009)     Personal LDL goal: 100  (04/26/2009)    Patient will work on the following items until the next clinic visit to reach self-care goals:     Medications and monitoring: take my medicines every day, check my blood pressure, bring all of my medications to every visit, examine my feet every day  (04/30/2010)     Eating: drink diet soda or water instead of juice or soda, eat more vegetables, use fresh or frozen vegetables, eat foods that are low in salt, eat baked foods instead of fried foods, eat fruit for snacks and desserts  (04/30/2010)     Activity: take a 30 minute walk every day  (04/30/2010)     Other: walk to mail  box or store once a week  (11/23/2008)    Diabetes self-management support: Resources for patients handout, Written self-care plan  (04/30/2010)   Diabetes care plan printed    Hypertension self-management support: Resources for patients handout, Written self-care plan  (04/30/2010)   Hypertension self-care plan printed.    Lipid self-management support: Resources for patients handout, Written self-care plan  (04/30/2010)   Lipid self-care plan printed.      Resource handout printed.   Nursing Instructions: Pap smear today   Process Orders Check Orders Results:     [facility]: ABN not required for this insurance Tests Sent for requisitioning (April 30, 2010 6:00 PM):     04/30/2010: [facility] -- T-Basic Metabolic Panel [phone] (signed)

## 2010-06-12 LAB — GLUCOSE, CAPILLARY: Glucose-Capillary: 119 mg/dL — ABNORMAL HIGH (ref 70–99)

## 2010-06-14 LAB — URINALYSIS, ROUTINE W REFLEX MICROSCOPIC
Hgb urine dipstick: NEGATIVE
Nitrite: NEGATIVE
Protein, ur: NEGATIVE mg/dL
Specific Gravity, Urine: 1.01 (ref 1.005–1.030)
Urobilinogen, UA: 0.2 mg/dL (ref 0.0–1.0)

## 2010-06-14 LAB — GLUCOSE, CAPILLARY
Glucose-Capillary: 116 mg/dL — ABNORMAL HIGH (ref 70–99)
Glucose-Capillary: 138 mg/dL — ABNORMAL HIGH (ref 70–99)
Glucose-Capillary: 139 mg/dL — ABNORMAL HIGH (ref 70–99)
Glucose-Capillary: 141 mg/dL — ABNORMAL HIGH (ref 70–99)

## 2010-06-14 LAB — POCT I-STAT, CHEM 8
Creatinine, Ser: 0.8 mg/dL (ref 0.4–1.2)
HCT: 42 % (ref 36.0–46.0)
Hemoglobin: 14.3 g/dL (ref 12.0–15.0)
Potassium: 3.3 mEq/L — ABNORMAL LOW (ref 3.5–5.1)
Sodium: 138 mEq/L (ref 135–145)

## 2010-06-14 LAB — BASIC METABOLIC PANEL
GFR calc non Af Amer: >60 mL/min (ref >60)
Potassium: 4 mEq/L (ref 3.5–5.1)
Sodium: 139 mEq/L (ref 135–145)

## 2010-06-14 LAB — CBC
HCT: 38.4 % (ref 36.0–46.0)
Hemoglobin: 12.9 g/dL (ref 12.0–15.0)
MCV: 89.7 fL (ref 78.0–100.0)
Platelets: 297 10*3/uL (ref 150–400)
RBC: 4.22 MIL/uL (ref 3.87–5.11)
RBC: 4.29 MIL/uL (ref 3.87–5.11)
RDW: 13.4 % (ref 11.5–15.5)
WBC: 10.5 10*3/uL (ref 4.0–10.5)
WBC: 10.5 10*3/uL (ref 4.0–10.5)

## 2010-06-14 LAB — DIFFERENTIAL
Basophils Absolute: 0.1 10*3/uL (ref 0.0–0.1)
Eosinophils Relative: 1 % (ref 0–5)
Lymphocytes Relative: 31 % (ref 12–46)
Neutrophils Relative %: 60 % (ref 43–77)

## 2010-06-14 LAB — HEMOGLOBIN A1C: Mean Plasma Glucose: 146 mg/dL — ABNORMAL HIGH (ref <117)

## 2010-06-17 LAB — GLUCOSE, CAPILLARY
Glucose-Capillary: 110 mg/dL — ABNORMAL HIGH (ref 70–99)
Glucose-Capillary: 120 mg/dL — ABNORMAL HIGH (ref 70–99)

## 2010-06-19 ENCOUNTER — Other Ambulatory Visit (HOSPITAL_COMMUNITY): Payer: Self-pay | Admitting: Dermatology

## 2010-06-19 LAB — GLUCOSE, CAPILLARY: Glucose-Capillary: 141 mg/dL — ABNORMAL HIGH (ref 70–99)

## 2010-07-04 ENCOUNTER — Other Ambulatory Visit: Payer: Self-pay | Admitting: Internal Medicine

## 2010-07-04 DIAGNOSIS — Z1231 Encounter for screening mammogram for malignant neoplasm of breast: Secondary | ICD-10-CM

## 2010-07-16 ENCOUNTER — Ambulatory Visit (HOSPITAL_COMMUNITY)
Admission: RE | Admit: 2010-07-16 | Discharge: 2010-07-16 | Disposition: A | Discharge status: Home / Self Care | Payer: Self-pay | Source: Ambulatory Visit | Attending: Internal Medicine | Admitting: Internal Medicine

## 2010-07-16 DIAGNOSIS — Z1231 Encounter for screening mammogram for malignant neoplasm of breast: Secondary | ICD-10-CM

## 2010-07-16 LAB — CARDIAC PANEL(CRET KIN+CKTOT+MB+TROPI)
CK, MB: 3.4 ng/mL (ref 0.3–4.0)
Relative Index: 1.5 (ref 0.0–2.5)
Relative Index: 1.6 (ref 0.0–2.5)
Total CK: 202 U/L — ABNORMAL HIGH (ref 7–177)
Total CK: 215 U/L — ABNORMAL HIGH (ref 7–177)

## 2010-07-16 LAB — BASIC METABOLIC PANEL
BUN: 3 mg/dL — ABNORMAL LOW (ref 6–23)
CO2: 30 mEq/L (ref 19–32)
Calcium: 9.1 mg/dL (ref 8.4–10.5)
Creatinine, Ser: 0.6 mg/dL (ref 0.4–1.2)
Glucose, Bld: 116 mg/dL — ABNORMAL HIGH (ref 70–99)

## 2010-07-16 LAB — DIFFERENTIAL
Lymphocytes Relative: 29 % (ref 12–46)
Lymphs Abs: 3.4 10*3/uL (ref 0.7–4.0)
Monocytes Relative: 6 % (ref 3–12)
Neutro Abs: 7.3 10*3/uL (ref 1.7–7.7)
Neutrophils Relative %: 63 % (ref 43–77)

## 2010-07-16 LAB — COMPREHENSIVE METABOLIC PANEL
ALT: 45 U/L — ABNORMAL HIGH (ref 0–35)
BUN: 6 mg/dL (ref 6–23)
Calcium: 9.4 mg/dL (ref 8.4–10.5)
Creatinine, Ser: 0.56 mg/dL (ref 0.4–1.2)
GFR calc non Af Amer: >60 mL/min (ref >60)
Glucose, Bld: 128 mg/dL — ABNORMAL HIGH (ref 70–99)
Sodium: 142 mEq/L (ref 135–145)
Total Protein: 6.6 g/dL (ref 6.0–8.3)

## 2010-07-16 LAB — CBC
Hemoglobin: 13.1 g/dL (ref 12.0–15.0)
MCHC: 33 g/dL (ref 30.0–36.0)
MCV: 93.1 fL (ref 78.0–100.0)
MCV: 94.1 fL (ref 78.0–100.0)
RBC: 4.14 MIL/uL (ref 3.87–5.11)
RDW: 13.7 % (ref 11.5–15.5)
WBC: 10.6 10*3/uL — ABNORMAL HIGH (ref 4.0–10.5)

## 2010-07-16 LAB — APTT: aPTT: 32 seconds (ref 24–37)

## 2010-07-16 LAB — LIPID PANEL
Cholesterol: 160 mg/dL (ref 0–200)
HDL: 45 mg/dL (ref >39)
Triglycerides: 71 mg/dL (ref <150)

## 2010-07-16 LAB — HEPARIN LEVEL (UNFRACTIONATED)
Heparin Unfractionated: 0.76 IU/mL — ABNORMAL HIGH (ref 0.30–0.70)
Heparin Unfractionated: 0.87 IU/mL — ABNORMAL HIGH (ref 0.30–0.70)

## 2010-07-16 LAB — PROTIME-INR
INR: 1.1 (ref 0.00–1.49)
Prothrombin Time: 14 seconds (ref 11.6–15.2)

## 2010-07-16 LAB — GLUCOSE, CAPILLARY
Glucose-Capillary: 128 mg/dL — ABNORMAL HIGH (ref 70–99)
Glucose-Capillary: 129 mg/dL — ABNORMAL HIGH (ref 70–99)
Glucose-Capillary: 130 mg/dL — ABNORMAL HIGH (ref 70–99)
Glucose-Capillary: 85 mg/dL (ref 70–99)

## 2010-07-16 LAB — RAPID URINE DRUG SCREEN, HOSP PERFORMED
Barbiturates: NOT DETECTED
Opiates: NOT DETECTED

## 2010-07-16 LAB — D-DIMER, QUANTITATIVE: D-Dimer, Quant: 0.23 ug/mL-FEU (ref 0.00–0.48)

## 2010-07-17 ENCOUNTER — Other Ambulatory Visit: Payer: Self-pay | Admitting: Internal Medicine

## 2010-07-17 DIAGNOSIS — R928 Other abnormal and inconclusive findings on diagnostic imaging of breast: Secondary | ICD-10-CM

## 2010-07-23 ENCOUNTER — Ambulatory Visit
Admission: RE | Admit: 2010-07-23 | Discharge: 2010-07-23 | Disposition: A | Discharge status: Home / Self Care | Payer: Self-pay | Source: Ambulatory Visit | Attending: Internal Medicine | Admitting: Internal Medicine

## 2010-07-23 DIAGNOSIS — R928 Other abnormal and inconclusive findings on diagnostic imaging of breast: Secondary | ICD-10-CM

## 2010-08-14 NOTE — Discharge Summary (Signed)
[name], [name]                 ACCOUNT NO.:  [id number]   MEDICAL RECORD NO.:  [id number]          PATIENT TYPE:  INP   LOCATION:  3705                         FACILITY:  [facility]   PHYSICIAN:  Lacretia Leigh. Hatcher, M.D.DATE OF BIRTH:  1955-10-24   DATE OF ADMISSION:  04/20/2008  DATE OF DISCHARGE:  04/22/2008                               DISCHARGE SUMMARY   DISCHARGE DIAGNOSES:  1. Chest pain likely due to muscular strain.  2. Hypertension.  3. Type 2 diabetes.  4. Hyperlipidemia.  5. Elevated transaminase.  6. Left shoulder pain.  7. Left knee pain.   DISCHARGE MEDICATIONS:  1. Lisinopril 40 mg p.o. daily.  2. Pravastatin 20 mg p.o. daily.  3. Ibuprofen 200 mg take 1-2 tablets up to 3-4 times a day.  4. Metformin 500 mg p.o. b.i.d., starting 1 day later.  5. Lopressor 50 mg p.o. b.i.d.   DISPOSITION AND FOLLOWUP:  [name] was discharged from the hospital on  April 22, 2008, in stable and improved condition.  Her chest pain had  resolved.  She will have appointment in Internal Medicine Outpatient  Clinic and we will call for appointment in about 3-4 weeks.  At that  time, we will check her blood pressure and CBG and adjust her  medications if necessary.  I have also scheduled the appointment with  Dr. Norris Cross practitioner nurse, [facility], on May 02, 2008, at  10:30 a.m.  At that time, she should be checked for her groin incision.   CONSULTATION:  Dr. Lyn Records, M.D., Cardiology.   PROCEDURE PERFORMED:  1. Chest x-ray on April 20, 2008, which showed no active      cardiopulmonary disease.  2. Cardiac catheterization on April 21, 2008, which showed EF 60%      with normal coronary artery.   ADMISSION HISTORY:  [name] is a 55 year old obese woman with diabetes,  hypertension, hyperlipidemia, and family history of premature coronary  artery disease.  She is admitted from the Outpatient Clinic for  evaluation of chest pain.  She presented to the clinic  with chief  complaint of mid chest and left shoulder pain, which has been going on  and off for the past 1-1/2 months.  She describes the pain as a sharp,  stabbing pain lasting about 2-3 seconds, occurring whenever she has to  reach for the things.  Her last chest pain occurred the day before  admission, accompanied by nausea but no vomiting.  The patient mentions  of having increasing shortness of breath even on minimum exertion, like  cleaning and sweeping.  She also mentions of having cough with yellow  sputum and sore throat but no fever.  For further evaluation of chest  pain, she was admitted to the hospital.   ADMISSION PHYSICAL EXAMINATION:  VITALS:  Temperature 97, blood pressure  138/86, pulse 73, respiratory rate 19, oxygen saturation 97 on room air.  GENERAL:  The patient is in no acute distress.  ENT:  Eyes, pupils are equal, round, and reactive to light.  Extraocular  movements are intact.  NECK:  Supple.  No thyroid enlargement.  No JVD.  LUNGS:  Clear to auscultation bilaterally.  No wheezing or crackles.  HEART:  Regular rate and rhythm.  No murmur.  ABDOMEN:  Soft, no tenderness or rebound tenderness.  Bowel sounds  positive.  EXTREMITY:  No edema.  NEURO:  Alert and oriented x3.  Cranial nerves II through XII intact.  PSYCHIATRY:  Appropriate.   ADMISSION LABS:  CBC:  White blood cells 11.6, hemoglobin 13.1,  platelets 266.  Sodium 142, potassium 4.2, chloride 106, bicarb 30, BUN  6, creatinine 0.56, glucose 128, AST 38, ALT 45, bilirubin 0.7, alkaline  phosphatase 50.  UDS negative.   HOSPITAL COURSE:  1. Chest pain likely due to muscular strain.  The patient was admitted      for the chest pain because she has family medical history of      premature coronary artery disease and also she has risk factors of      diabetes, hypertension, hyperlipidemia, and morbid obesity.  Her      EKG shows V1 to V3 T-wave inversion. To rule out acute coronary      syndrome,  we admitted the patient to the telemetry unit.  During      the hospitalization, we checked the EKG and cycle cardiac enzymes      negative x3, and also was given aspirin and pain medications.      Because of her risk factors, we had a Cardiology consult and they      recommended to do the cardiac catheterization which was done on      April 21, 2008, which shows normal coronary artery and ejection      fraction is also normal.  Her EKG also showed improvement for the T-      wave inversion in the V1 and V3 leads on the next day of admission.      We think that her chest pain is most likely from the muscular      strain, not from coronary artery disease.  She was discharged to      home in a stable condition.  After discharge, she will need to go      to see Dr. Norris Cross practicing nurse to check her groin incision at      the time of cardiac catheterization.  2. Hypertension.  During the hospitalization, her blood pressure at      rest is 129-154/69-99, we gave the patient Lopressor and her blood      pressure has been well controlled; on discharge, her blood pressure      is 134/89.  After discharge, she needs to continue to take her      antihypertensive medication including Lopressor and lisinopril.  3. Diabetes.  The patient's blood sugar has been very well controlled.      Her most recent A1c is 6.5.  During the hospitalization, we did not      give the patient any metformin or insulin and her CBG has got well      controlled, ranging 85 to 129.  After discharge, she will need to      continue to take her metformin 500 mg p.o. b.i.d.  4. Hyperlipidemia.  Her fasting lipid panel shows LDL is 101, HDL 0.5.      She will continue to take her home medication pravastatin 20 mg      p.o. daily.   DISCHARGE VITALS:  Temperature 98.5, blood pressure 174/89, pulse 77,  respiratory rate 18, oxygen saturation 97 on room air.   DISCHARGE LABS:  Sodium 139, potassium 3.7, chloride 100,  bicarb was 30,  BUN is 3, creatinine 0.6, glucose 116.  Cardiac enzymes negative x3.  CBC, white blood cells 10.6, hemoglobin 12.8, hematocrit 38.9, platelets  241.      Jackson Latino, MD  Electronically Signed      Lacretia Leigh. Ninetta Lights, M.D.  Electronically Signed    ZY/MEDQ  D:  04/22/2008  T:  04/23/2008  Job:  [id number]

## 2010-08-14 NOTE — Consult Note (Signed)
NAMEJALIE, [name]                 ACCOUNT NO.:  [id number]   MEDICAL RECORD NO.:  [id number]          PATIENT TYPE:  INP   LOCATION:  3705                         FACILITY:  [facility]   PHYSICIAN:  Lyn Records, M.D.   DATE OF BIRTH:  1955/10/16   DATE OF CONSULTATION:  04/20/2008  DATE OF DISCHARGE:                                 CONSULTATION   REASON FOR CONSULTATION:  Chest discomfort.   CONCLUSIONS:  1. Chest discomfort, qualitatively similar to angina with atypical      features.  Rule out coronary artery disease.  2. Diabetes mellitus.  3. Hypertension.  4. Hyperlipidemia.  5. Obesity.   RECOMMENDATIONS:  1. Serial enzymes, rule out myocardial infarction.  2. D-dimer to exclude/screen for pulmonary embolism.  3. Would need an ischemic evaluation with a repeat nuclear study      (previously abnormal) or coronary angiography (more definitive).  4. Agree with IV heparin.  5. IV nitroglycerin if recurrent chest pain.  6. Start beta-blocker therapy.   COMMENTS:  The patient is a 55 year old African American lady who for  the past 2 weeks and possibly longer has experienced intermittent  episodes of chest pain that are poorly characterized.  These seem to be  steered up by physical activity and anxiety.  With physical activity,  the discomfort will abate after resting.  With anxiety, the discomfort  eventually abates on its on.  Episodes of chest discomfort have been  lasting less than 5 minutes in general.  She has also experienced some  dyspnea.  She denies lower extremity swelling.  She has never had a  history of lower extremity clots.   Chest pain in 2006 was evaluated by a Cardiolite scintigraphy study and  there was mid-to-apical anterior wall perfusion abnormality with  redistribution that was felt to represent soft tissue attenuation versus  mild ischemia.  The patient has not been followed up as an outpatient.  Dr. Mayford Knife was the cardiologist who evaluated the  patient at that time.   MEDICATIONS:  1. Metformin 500 mg b.i.d.  2. Lisinopril 40 mg daily.  3. Ibuprofen 200 mg p.r.n.  4. Pravastatin 20 mg daily.   ALLERGIES:  None.   FAMILY HISTORY:  Mother died of a CVA at 42.  Father had an MI at age 30  and died due to seizure disorder.  A sister had an MI at age 17.  She  was also diabetic and has a history of PCI and defibrillator insertion.   SOCIAL HISTORY:  The patient is unemployed.  She has an 52 year old son.  She denies tobacco and alcohol use.  She does not use drugs.   PHYSICAL EXAMINATION:  GENERAL:  The patient is obese.  She has the  appearance of __________.  VITAL SIGNS:  Her blood pressure is 138/86.  The heart rate is 74.  LUNGS:  Clear.  CARDIAC:  Reveals an S4 gallop, but otherwise unremarkable.  No murmur.  ABDOMEN:  Soft.  EXTREMITIES:  No edema.  SKIN:  Unremarkable.   DIAGNOSTIC STUDIES:  EKG reveals nonspecific ST-T wave  flattening.   LABORATORY DATA:  Pending.   DISCUSSION:  The patient is 55 and has multiple risk factors that  include family history of coronary artery disease, hypertension,  obesity, diabetes, and hyperlipidemia.  She furthermore had an  equivocal/abnormal Cardiolite study in 2006.  She will need a repeat  ischemic workup unless other data leads [place] in a different direction.  We  will give the patient __________ coronary angiography versus a  Cardiolite tomorrow pending Dr. Gloris Manchester Turner's preference.      Lyn Records, M.D.  Electronically Signed     HWS/MEDQ  D:  04/20/2008  T:  04/21/2008  Job:  [id number]   cc:   [facility]

## 2010-08-14 NOTE — Cardiovascular Report (Signed)
[name], [name]                 ACCOUNT NO.:  [id number]   MEDICAL RECORD NO.:  [id number]          PATIENT TYPE:  INP   LOCATION:  3705                         FACILITY:  [facility]   PHYSICIAN:  Armanda Magic, M.D.     DATE OF BIRTH:  Aug 08, 1955   DATE OF PROCEDURE:  04/21/2008  DATE OF DISCHARGE:                            CARDIAC CATHETERIZATION   REFERRING PHYSICIAN:  Lacretia Leigh. Ninetta Lights, MD   PROCEDURE:  Left heart catheterization, coronary angiography, and left  ventriculography.   OPERATOR:  Armanda Magic, MD   INDICATIONS:  Chest pain.   COMPLICATIONS:  None.   IV ACCESS:  Via right femoral artery 6-French sheath.   IV MEDICATIONS:  Versed 1 mg and fentanyl 25 mcg.   This is a 55 year old morbidly obese African American female who has a  history of obesity, hypertension and family history of premature  coronary artery disease.  She also has a history of diabetes and now  presents for cardiac catheterization because of episodes of chest pain.  _______The patient does have a history of a borderline nuclear stress  test several years ago.  Anterior wall abnormality was felt to be due to  morbid obesity.  The patient now presents for cardiac catheterization.   The patient was brought to the Cardiac Catheterization Laboratory in a  fasting nonsedated state.  Informed consent was obtained.  The patient  was connected to continuous heart rate and pulse oximetry monitor and  blood pressure monitor.  The right groin was prepped and draped in a  sterile fashion.  Xylocaine 1% was used for local anesthesia.  Using a  modified Seldinger technique, a 6-French sheath was placed in right  femoral artery.  Under fluoroscopic guidance, a 6-French JL-4 catheter  was placed in left coronary artery.  Multiple cine films were taken at  30-degree RAO and 40-degree LAO views.  This catheter was then exchanged  out over a guidewire for a 6-French JR-4 catheter which was placed under  fluoroscopic guidance in right coronary artery.  Multiple cine films  were taken at 30-degree RAO and 40-degree LAO views.  This catheter was  then exchanged out over a guidewire for a 6-French angled pigtail  catheter which was placed under fluoroscopic guidance to left  ventricular cavity.  Left ventriculography was performed in 30 degree  RAO view using a total of 30 mL of contrast at 15 mL per second.  The  catheter was then pulled back across the aortic valve with no  significant gradient noted.  At the end of the procedure, all catheters  and sheaths were removed.  Manual compression was performed and adequate  hemostasis was obtained.  The patient was transferred back to room in  stable condition.   RESULTS:  1. The left main coronary artery is widely patent and bifurcates in      the left anterior descending artery and left circumflex artery.   1. The left anterior descending artery is widely patent throughout its      course.  The apex giving rise to one diagonal branch which is  widely patent.   1. The left circumflex is widely patent throughout its course and      traverses the AV groove.  It gives rise to a large first obtuse      marginal branch which is widely patent.  It then gives rise to a      second obtuse marginal branch which is widely patent and terminates      in a third obtuse marginal branch which is widely patent.   1. The right coronary artery is widely patent and bifurcates into the      posterior descending artery and posterolateral artery which are      widely patent.   Left ventriculography shows normal LV function EF 60%, aortic pressure  153/88 mmHg, LV pressure 148/-1 mmHg, LVEDP 15 mmHg.  LV function is  normal at 60%.   ASSESSMENT:  1. Noncardiac chest pain.  2. Normal coronary arteries.  3. Normal left ventricular function.  4. Obesity.  5. Hypertension.  6. Diabetes.   PLAN:  IV fluid and bedrest for 6 hours.  Okay for discharge  today.  From a cardiac standpoint, follow up with my nurse practitioner, Tillman Sers in 2 weeks for groin check.  No Glucophage x48 hours.      Armanda Magic, M.D.  Electronically Signed     TT/MEDQ  D:  04/21/2008  T:  04/22/2008  Job:  [id number]   cc:   Lacretia Leigh. Ninetta Lights, M.D.

## 2010-08-17 NOTE — Discharge Summary (Signed)
NAMEFARHEEN, PFAHLER                 ACCOUNT NO.:  [id number]   MEDICAL RECORD NO.:  [id number]          PATIENT TYPE:  INP   LOCATION:  3713                         FACILITY:  [facility]   PHYSICIAN:  Fransisco Hertz, M.D.  DATE OF BIRTH:  Jun 16, 1955   DATE OF ADMISSION:  02/06/2005  DATE OF DISCHARGE:  02/08/2005                                 DISCHARGE SUMMARY   CONSULTANTS:  Armanda Magic, M.D., from cardiology.   DISCHARGE DIAGNOSES:  1.  Stable angina with no area of ischemia on nuclear medicine scan and      normal 2-D echocardiogram.  2.  Hypertension.   DISCHARGE MEDICATIONS:  Lisinopril 20 mg p.o. daily.   DISPOSITION AND FOLLOW-UP:  At the time of discharge Ms. Scoville, a 55 year  old, was discharged in good condition.  The patient was admitted with  anginal pain, classic anginal pain, and ruled out for an acute myocardial  infarction.  Nuclear medicine scan and 2-D echo were normal, and cardiac  enzymes showed that there was no acute myocardial infarction.  The patient  was requested to follow up with Duncan Dull, M.D., at her next regularly  scheduled follow-up appointment or as the need arose earlier than that.   PROCEDURES PERFORMED DURING THE HOSPITAL ADMISSION:  1.  A 2-dimensional echocardiogram that was within normal limits and showed      an ejection fraction of 72% that was conducted on February 08, 2005, by      Dr. Mayford Knife.  2.  Nuclear medicine scan, Cardiolite, was conducted again on February 08, 2005, and showed no area of ischemia.  It was also conducted by Dr.      Mayford Knife.   HISTORY AND PHYSICAL:  Ms. Brandner is a 55 year old female with a past medical  history significant for hypertension, who presents with a two-day history of  chest tightness associated with exertion.  Ms. 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  Tightness and pain was 10/10 on a pain scale, associated with shortness of  breath and lasted for 30 minutes, was relieved by rest.  She experienced  another episode one day prior to admission that resolved, again with rest.  There was no pleuritic pain or reflux.  There was no orthopnea or edema, no  fevers or chills on admission.   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  There was no history of smoking, no  history of alcohol use.   PHYSICAL EXAMINATION:  Normal vital signs with blood pressure of 120/76 and  pulse of 87.  Cardiovascular exam revealed a regular sinus rhythm and normal  rate.  There was a normal S1 and S2 sounds with no murmurs, gallops noted.  Otherwise, physical exam was essentially unremarkable.  Please see details  in the medical records.   EKG revealed a normal sinus rhythm without any  ST or T-wave elevations or  changes.  Metabolic profile showed sodium at 142, potassium at 4.0, chloride  102, bicarb 30, BUN 10, creatinine 0.7, and glucose 90.  Calcium was 9.3.  Cardiac enzymes were as follows:  The first set showed a CK of 332, CK-MB of  4.3, and a troponin I was less than 0.01.  Enzyme sets 2 and 3 showed a  creatinine kinase at 286 and 246, showed CK-MB of 3.2 and 2.9, showed  troponin I at less than 0.01 and also again less than 0.01.  D-dimer showed  a value at less than 0.22.  Coagulation profile was normal, showing a PT of  13.1, INR of 1.0, PTT 34.   HOSPITAL COURSE:  Problem 1.  STABLE ANGINA, RULE OUT ACUTE MYOCARDIAL INFARCTION:  Ms. Wordell  was admitted with stable angina as described in the admitting HPI.  She  remained stable throughout the admission with cardiac enzymes that were not  elevated and were not suggestive of acute myocardial infarction.  EKGs  remained normal.  Nonetheless, given the patient's history of multiple  coronary artery disease risk factors, including  hypertension, obesity, the  patient was worked up for underlying coronary artery disease with help from  cardiology under Dr. Mayford Knife.  The patient was given a nuclear medicine  Cardiolite scan that revealed no areas of ischemia.  The patient also  received a 2-dimensional echocardiogram that was within normal limits and  showed left ventricular ejection fraction of 72%.  At the time during  admission, the patient was maintained on standard lisinopril 20 mg p.o.  daily that she was taking at home and maintained good blood pressure  control.  A fasting lipid profile showed the following values:  Total  cholesterol was 169, triglycerides 61, HDL 48, LDL 109, slightly elevated,  VLDL was 12.  At the time of discharge the patient was in good condition  without any complaints of chest pain or shortness of breath.  She was asked  to follow up with cardiology, Dr. Mayford Knife, on an as-needed basis, and also to  follow up with Dr. Duncan Dull in one month's time, which the patient  requested to arrange an appointment with after discharge.   Problem 2.  HYPERTENSION:  The patient was maintained on good blood pressure  control with her home dose of lisinopril at 20 mg p.o. daily.   At the time of discharge, laboratory values were as follows:  Metabolic  profile revealed a sodium of 140, potassium 4.1, chloride 102, bicarbonate  30, BUN 7, creatinine 0.8, glucose 107.      Coralie Carpen, M.D.    ______________________________  Fransisco Hertz, M.D.    FR/MEDQ  D:  02/11/2005  T:  02/12/2005  Job:  [id number]   cc:   Duncan Dull, M.D.  Fax: [phone]

## 2010-08-22 ENCOUNTER — Encounter: Payer: Self-pay | Admitting: Internal Medicine

## 2010-10-11 ENCOUNTER — Encounter: Payer: Self-pay | Admitting: Internal Medicine

## 2010-11-26 ENCOUNTER — Encounter: Payer: Self-pay | Admitting: Internal Medicine

## 2010-11-26 ENCOUNTER — Ambulatory Visit (INDEPENDENT_AMBULATORY_CARE_PROVIDER_SITE_OTHER): Payer: Self-pay | Admitting: Internal Medicine

## 2010-11-26 DIAGNOSIS — I10 Essential (primary) hypertension: Secondary | ICD-10-CM

## 2010-11-26 DIAGNOSIS — K0889 Other specified disorders of teeth and supporting structures: Secondary | ICD-10-CM

## 2010-11-26 DIAGNOSIS — E785 Hyperlipidemia, unspecified: Secondary | ICD-10-CM

## 2010-11-26 DIAGNOSIS — K089 Disorder of teeth and supporting structures, unspecified: Secondary | ICD-10-CM

## 2010-11-26 DIAGNOSIS — E119 Type 2 diabetes mellitus without complications: Secondary | ICD-10-CM

## 2010-11-26 LAB — COMPREHENSIVE METABOLIC PANEL
Albumin: 4.2 g/dL (ref 3.5–5.2)
BUN: 10 mg/dL (ref 6–23)
CO2: 30 mEq/L (ref 19–32)
Calcium: 9.6 mg/dL (ref 8.4–10.5)
Chloride: 105 mEq/L (ref 96–112)
Creat: 0.66 mg/dL (ref 0.50–1.10)

## 2010-11-26 LAB — LIPID PANEL
Cholesterol: 178 mg/dL (ref 0–200)
HDL: 51 mg/dL (ref >39)
Total CHOL/HDL Ratio: 3.5 Ratio

## 2010-11-26 MED ORDER — QUINAPRIL-HYDROCHLOROTHIAZIDE 20-25 MG PO TABS: 1.0000 | ORAL_TABLET | Freq: Every day | ORAL | Status: DC

## 2010-11-26 MED ORDER — GLIPIZIDE 5 MG PO TABS: 5.0000 mg | ORAL_TABLET | Freq: Two times a day (BID) | ORAL | Status: DC

## 2010-11-26 MED ORDER — METOPROLOL TARTRATE 100 MG PO TABS: 100.0000 mg | ORAL_TABLET | Freq: Two times a day (BID) | ORAL | Status: DC

## 2010-11-26 NOTE — Patient Instructions (Addendum)
1. Please check your blood glucose level once a day before breakfast.  2. Stop taking Fluoxetine.  3. Stop taking Metformin. 4. New Medication: Glipizide  5. New Dosage: Metoprolol 100 mg twice a day.

## 2010-12-10 ENCOUNTER — Telehealth: Payer: Self-pay | Admitting: *Deleted

## 2010-12-10 DIAGNOSIS — K0889 Other specified disorders of teeth and supporting structures: Secondary | ICD-10-CM | POA: Insufficient documentation

## 2010-12-10 NOTE — Assessment & Plan Note (Signed)
Will d/c Metfrom and start her on Glipizide since patient noted diarrhea. I informed the patient to check her blood glucose level on  A regular basis and if she is noticing any low CBG she should call the clinic for further instruction.

## 2010-12-10 NOTE — Telephone Encounter (Signed)
Pt calls and states she took glipizide 1 time last Tuesday and she got weak, sweaty and nauseated. She has taken since, she states her niece, who happens to be a nurse told her not to take it because it was dropping her sugar. She request an appt and is given appt 9/11

## 2010-12-10 NOTE — Assessment & Plan Note (Signed)
Will continue current regimen for now.

## 2010-12-10 NOTE — Telephone Encounter (Signed)
OK 

## 2010-12-10 NOTE — Assessment & Plan Note (Signed)
BP elevated. Will increase Metoprolol to 100 mg bid. Reassess at the next office for possible changes in management.   BP Readings from Last 3 Encounters:  11/26/10 154/103  04/30/10 129/77  04/12/10 145/97

## 2010-12-10 NOTE — Progress Notes (Signed)
  Subjective:   Patient ID: [name] female   DOB: 1955/09/27 55 y.o.   MRN: [id number]  HPI: [name] is a 55 y.o. with PMH significant for  DM, HTN, who presented to the clinic for medication refill and dental pain.  1. Dental pain: this has been going on for some time . Patient has no insurance and no orange card. She was trying to get all the papers all together. 2. DM: She noted that she not tolerating Metformin since she has significant diarrhea. She would like to try something else. 3. She has been taking her blood pressure on a regular basis.    Past Medical History  Diagnosis Date  . Hypertension   . Hyperlipidemia   . Diabetes mellitus   . Hyperplastic colon polyp 12-2007    Dr. Christella Hartigan   Current Outpatient Prescriptions  Medication Sig Dispense Refill  . guaiFENesin-codeine (ROBITUSSIN AC) 100-10 MG/5ML syrup Take 5 mLs by mouth at bedtime as needed. Take 5mL by mouth at bedtime as needed for cough       . metoprolol (LOPRESSOR) 100 MG tablet Take 1 tablet (100 mg total) by mouth 2 (two) times daily.  60 tablet  3  . quinapril-hydrochlorothiazide (ACCURETIC) 20-25 MG per tablet Take 1 tablet by mouth daily.  30 tablet  3  . simvastatin (ZOCOR) 40 MG tablet Take 40 mg by mouth at bedtime.        Marland Kitchen glipiZIDE (GLUCOTROL) 5 MG tablet Take 1 tablet (5 mg total) by mouth 2 (two) times daily before a meal.  30 tablet  3   Review of Systems: Constitutional: Denies fever, chills, diaphoresis, appetite change and fatigue.  Respiratory: Denies SOB, DOE, cough, chest tightness,  and wheezing.   Cardiovascular: Denies chest pain, palpitations and leg swelling.  Gastrointestinal: Denies nausea, vomiting, abdominal pain,constipation, blood in stool and abdominal distention but notes diarrhea with metformin Genitourinary: Denies dysuria, urgency, frequency, hematuria, flank pain and difficulty urinating.  Musculoskeletal: Denies myalgias, back pain, joint swelling, arthralgias and  gait problem.  Skin: Denies pallor, rash and wound.  Neurological: Denies dizziness, seizures, syncope, weakness, light-headedness, numbness and headaches.    Objective:  Physical Exam: Filed Vitals:   11/26/10 1629  BP: 154/103  Pulse: 82  Temp: 96.8 F (36 C)  TempSrc: Oral  Height: 5\' 2"  (1.575 m)  Weight: 258 lb 14.4 oz (117.436 kg)   Constitutional: Vital signs reviewed.  Patient is a well-developed and well-nourished  in no acute distress and cooperative with exam. Alert and oriented x3.  Neck: Supple, Trachea midline normal ROM, No JVD, Cardiovascular: RRR, S1 normal, S2 normal, no MRG, pulses symmetric and intact bilaterally Pulmonary/Chest: CTAB, no wheezes, rales, or rhonchi Abdominal: Soft. Non-tender, non-distended, bowel sounds are normal, no masses, organomegaly, or guarding present.  Musculoskeletal: No joint deformities, erythema, or stiffness, ROM full and no nontender Neurological: A&O x3, no focal motor deficit, sensory intact to light touch bilaterally.  Skin: Warm, dry and intact. No rash, cyanosis, or clubbing.    Assessment & Plan:

## 2010-12-10 NOTE — Assessment & Plan Note (Signed)
This seems a long term issue. Since patient has not insurance and no orange card I am not able to refer the patient anywhere. i informed the patient she needs to work on the paper work for the orange card as soon as possible.

## 2010-12-11 ENCOUNTER — Encounter: Payer: Self-pay | Admitting: Internal Medicine

## 2010-12-11 ENCOUNTER — Ambulatory Visit (INDEPENDENT_AMBULATORY_CARE_PROVIDER_SITE_OTHER): Payer: Self-pay | Admitting: Internal Medicine

## 2010-12-11 VITALS — BP 149/103 | HR 63 | Temp 96.5°F | Ht 62.0 in | Wt 258.8 lb

## 2010-12-11 DIAGNOSIS — I10 Essential (primary) hypertension: Secondary | ICD-10-CM

## 2010-12-11 DIAGNOSIS — E119 Type 2 diabetes mellitus without complications: Secondary | ICD-10-CM

## 2010-12-11 LAB — GLUCOSE, CAPILLARY: Glucose-Capillary: 91 mg/dL (ref 70–99)

## 2010-12-11 NOTE — Patient Instructions (Signed)
Please make an appointment in 7-10 days. Please make an appointment with primary care physician in 3-4 months. Please check your blood sugar if you have any of the symptoms of dizziness, sweating, light headedness, or you feel hungry. If sugar is less than 70 , eat a candy or cookie or drink juice-- take something which has high sugars in it. Please call the clinic and make an early appointment if this happens again.

## 2010-12-13 NOTE — Assessment & Plan Note (Signed)
Blood pressure 149/103, mildly elevated. Metformin change 200 by mouth twice a day during last visit, I will continue the same dose and reassess during the next visit.

## 2010-12-13 NOTE — Progress Notes (Signed)
  Subjective:    Patient ID: [name], female    DOB: May 04, 1955, 55 y.o.   MRN: [id number]  HPI Ms. [name] is a pleasant 55 year old woman with past with history of DM 2, hyperlipidemia, hypertension comes to the clinic due to feeling dizzy, sweaty after starting glipizide during last visit. She was seen by Dr. Loistine Chance and was prescribed glipizide 5 milligram by mouth daily and metformin was stopped due to diarrhea. She was instructed to check her CBG is if she felt any symptoms of hypoglycemia. Unfortunately she did not have battery in her glucose meter and so was not able to check regularly. She felt dizzy, had sweating and felt hungry sometime after taking the first dose of glipizide. Her niece who happens to be nurse advised her to hold that until she can check her CBG as she might be hypoglycemic. As she did not have the working meter she'll check CBG but stopped taking glipizide at all until the clinic visit today. So he is not taking either glipizide or metformin at present. Her CBG today is 96 even after being off the diabetic medication for a week. Her last HbA1c was 6.5 on 11/26/2010-at that time she was on metformin. She just had one episode of possible hypoglycemic spell and did not have any after that as she's not taking any diabetic medication. She denies any chest pain, short of breath, fever, chills, headache, abdominal pain, diarrhea or    Review of Systems    as per history of present illness, all other systems reviewed and negative Objective:   Physical Exam Constitutional: Vital signs reviewed.  Patient is a well-developed and well-nourished in no acute distress and cooperative with exam. Alert and oriented x3.  Head: Normocephalic and atraumatic Mouth: no erythema or exudates, MMM Eyes: PERRL, EOMI, conjunctivae normal, No scleral icterus.  Neck: Supple, Trachea midline normal ROM Cardiovascular: RRR, S1 normal, S2 normal, no MRG, pulses symmetric and intact  bilaterally Pulmonary/Chest: CTAB, no wheezes, rales, or rhonchi Abdominal: Soft. Non-tender, non-distended, bowel sounds are normal, no masses, organomegaly, or guarding present.  Musculoskeletal: No joint deformities, erythema, or stiffness, ROM full and no nontender Neurological: A&O x3, Strenght is normal and symmetric bilaterally, cranial nerve II-XII are grossly intact, no focal motor deficit, sensory intact to light touch bilaterally.  Skin: Warm, dry and intact. No rash, cyanosis, or clubbing.         Assessment & Plan:

## 2010-12-13 NOTE — Assessment & Plan Note (Addendum)
Possible hypoglycemic episode after starting glipizide.  Now she has a working glucose meter, but she did not want to start glipizide until further instructions from Dr. I discussed with her about the signs and symptoms of hypoglycemia and the need to check CBG anytime she feels any of the symptoms and if her glucose is less than 70 than she  need  2 eat a candy, cookie or drink juice- anything which has high sugar and his handy and then repeat her CBG in a while. Also if it drops were low, she should call 911 or the clinic. I advised her to start glipizide while taking all of of preventive measures and keeping meter and food around. She understood and agreed to the plan. I will follow her up in 7-10 days

## 2010-12-19 ENCOUNTER — Ambulatory Visit: Payer: Self-pay | Admitting: Internal Medicine

## 2010-12-28 LAB — GLUCOSE, CAPILLARY: Glucose-Capillary: 134 — ABNORMAL HIGH

## 2010-12-31 LAB — GLUCOSE, CAPILLARY: Glucose-Capillary: 129 — ABNORMAL HIGH

## 2011-02-18 ENCOUNTER — Encounter: Payer: Self-pay | Admitting: Internal Medicine

## 2011-02-18 ENCOUNTER — Ambulatory Visit (INDEPENDENT_AMBULATORY_CARE_PROVIDER_SITE_OTHER): Payer: Self-pay | Admitting: Internal Medicine

## 2011-02-18 VITALS — BP 126/85 | HR 65 | Temp 96.5°F | Ht 62.0 in | Wt 253.1 lb

## 2011-02-18 DIAGNOSIS — M1990 Unspecified osteoarthritis, unspecified site: Secondary | ICD-10-CM

## 2011-02-18 DIAGNOSIS — Z23 Encounter for immunization: Secondary | ICD-10-CM

## 2011-02-18 DIAGNOSIS — I10 Essential (primary) hypertension: Secondary | ICD-10-CM

## 2011-02-18 DIAGNOSIS — E119 Type 2 diabetes mellitus without complications: Secondary | ICD-10-CM

## 2011-02-18 MED ORDER — NAPROXEN 250 MG PO TABS: 250.0000 mg | ORAL_TABLET | Freq: Two times a day (BID) | ORAL | Status: DC

## 2011-02-18 NOTE — Patient Instructions (Signed)
1. Stop taking the Glipizide. ( Diabetes medication) 2. Check your blood glucose every morning before breakfast and every evening before bedtime. Write it down and call the clinic in 2 weeks with these numbers. We may have to start you an another medication at that time.

## 2011-02-18 NOTE — Progress Notes (Signed)
Subjective:   Patient ID: [name] female   DOB: February 12, 1956 55 y.o.   MRN: [id number]  HPI: [name] is a 55 y.o. female with PMH significant as outlined below who presented to the clinic for a regular follow up.  1. DM : patient continues to have trouble to take Glipizide . She gets palpitation and funny feeling. She noted that had checked her blood glucose level and it was around 130s.  2. Leg pain: It feels like in the past . It is worse standing for long time. Patient has chronic leg pain due to sever osteoarthritis. She noted opoid medication has helped her in the past. She will get  Insurance starting from 04/2011.      Past Medical History  Diagnosis Date  . Hypertension   . Hyperlipidemia   . Diabetes mellitus   . Hyperplastic colon polyp 12-2007    Dr. Christella Hartigan   Current Outpatient Prescriptions  Medication Sig Dispense Refill  . glipiZIDE (GLUCOTROL) 5 MG tablet Take 1 tablet (5 mg total) by mouth 2 (two) times daily before a meal.  30 tablet  3  . guaiFENesin-codeine (ROBITUSSIN AC) 100-10 MG/5ML syrup Take 5 mLs by mouth at bedtime as needed. Take 5mL by mouth at bedtime as needed for cough       . metoprolol (LOPRESSOR) 100 MG tablet Take 1 tablet (100 mg total) by mouth 2 (two) times daily.  60 tablet  3  . quinapril-hydrochlorothiazide (ACCURETIC) 20-25 MG per tablet Take 1 tablet by mouth daily.  30 tablet  3  . simvastatin (ZOCOR) 40 MG tablet Take 40 mg by mouth at bedtime.         Family History  Problem Relation Age of Onset  . Stroke Mother     age 38  . Heart attack Father     age 81  . Heart attack Sister     age 22  . Colon cancer      Has an aunt that died of Colon CA (late 50's-early 68's)   History   Social History  . Marital Status: Married    Spouse Name: N/A    Number of Children: N/A  . Years of Education: N/A   Occupational History  . Unemployed    Social History Main Topics  . Smoking status: Never Smoker   . Smokeless  tobacco: None  . Alcohol Use: No  . Drug Use: No  . Sexually Active: Yes -- Female partner(s)     with seperated husband   Other Topics Concern  . None   Social History Narrative   Lives at home with son.  Separated from husband- still sexually active with him   Review of Systems: Constitutional: Denies fever, chills, diaphoresis, appetite change and fatigue.  Respiratory: Denies SOB, DOE, cough, chest tightness,  and wheezing.   Cardiovascular: Denies chest pain, palpitations and leg swelling.  Gastrointestinal: Denies nausea, vomiting, abdominal pain, diarrhea, constipation, blood in stool and abdominal distention.  Genitourinary: Denies dysuria, urgency, frequency, hematuria, flank pain and difficulty urinating.  Skin: Denies pallor, rash and wound.    Objective:  Physical Exam: Filed Vitals:   02/18/11 1547  BP: 141/89  Pulse: 71  Temp: 96.5 F (35.8 C)  TempSrc: Oral  Height: 5\' 2"  (1.575 m)  Weight: 253 lb 1.6 oz (114.805 kg)   Constitutional: Vital signs reviewed.  Patient is a well-developed and well-nourished  in no acute distress and cooperative with exam. Alert and oriented x3.  Cardiovascular: RRR, S1 normal, S2 normal, no MRG, pulses symmetric and intact bilaterally Pulmonary/Chest: CTAB, no wheezes, rales, or rhonchi Abdominal: Soft. Non-tender, non-distended, bowel sounds are normal, no masses, organomegaly, or guarding present.  Musculoskeletal: Knee Joint swelling bilaterally noted, stiffness and decreased ROM but no  Erythema Neurological: A&O x3, no focal motor deficit, sensory intact to light touch bilaterally.  Skin: Warm, dry and intact. No rash, cyanosis, or clubbing.

## 2011-02-20 ENCOUNTER — Encounter: Payer: Self-pay | Admitting: Internal Medicine

## 2011-02-20 NOTE — Assessment & Plan Note (Addendum)
Patient continues to have sever pain in her knees. Most likely worsening osteoarthritis. Since it will not change management at this point I will not proceed with imagine studies. I will try Naproxen and reevaluate the patient for possible changes in management. Patient will have insurance by 04/2011 at that time I will refer patient to Ortho. Patient refuses steroid injection.

## 2011-02-20 NOTE — Assessment & Plan Note (Signed)
I will d/c glipizide since patient has been not using it on a regular basis and Hgb A1c today was 6.0. Patient did not tolerate Metformin in the past due to GI symptoms. I informed the patient that she needs to continue to monitor her blood glucose level on a daily basis twice a day and document the reading. She should call the clinic for any changes in management.

## 2011-02-20 NOTE — Assessment & Plan Note (Signed)
Blood pressure well controlled. Continue current regimen. BP Readings from Last 3 Encounters:  02/18/11 126/85  12/11/10 149/103  11/26/10 154/103

## 2011-03-18 ENCOUNTER — Encounter: Payer: Self-pay | Admitting: Internal Medicine

## 2011-04-03 ENCOUNTER — Other Ambulatory Visit: Payer: Self-pay

## 2011-04-03 ENCOUNTER — Emergency Department (HOSPITAL_COMMUNITY)
Admission: EM | Admit: 2011-04-03 | Discharge: 2011-04-03 | Disposition: A | Discharge status: Home / Self Care | Payer: Medicare Other | Attending: Emergency Medicine | Admitting: Emergency Medicine

## 2011-04-03 ENCOUNTER — Encounter (HOSPITAL_COMMUNITY): Payer: Self-pay | Admitting: *Deleted

## 2011-04-03 ENCOUNTER — Telehealth: Payer: Self-pay | Admitting: *Deleted

## 2011-04-03 ENCOUNTER — Emergency Department (HOSPITAL_COMMUNITY): Payer: Medicare Other

## 2011-04-03 DIAGNOSIS — E119 Type 2 diabetes mellitus without complications: Secondary | ICD-10-CM | POA: Insufficient documentation

## 2011-04-03 DIAGNOSIS — I10 Essential (primary) hypertension: Secondary | ICD-10-CM | POA: Insufficient documentation

## 2011-04-03 DIAGNOSIS — M79609 Pain in unspecified limb: Secondary | ICD-10-CM | POA: Insufficient documentation

## 2011-04-03 DIAGNOSIS — R0789 Other chest pain: Secondary | ICD-10-CM | POA: Insufficient documentation

## 2011-04-03 DIAGNOSIS — Z79899 Other long term (current) drug therapy: Secondary | ICD-10-CM | POA: Insufficient documentation

## 2011-04-03 DIAGNOSIS — E785 Hyperlipidemia, unspecified: Secondary | ICD-10-CM | POA: Insufficient documentation

## 2011-04-03 LAB — CBC
HCT: 39.1 % (ref 36.0–46.0)
Hemoglobin: 13.2 g/dL (ref 12.0–15.0)
MCH: 30.8 pg (ref 26.0–34.0)
MCHC: 33.8 g/dL (ref 30.0–36.0)
MCV: 91.4 fL (ref 78.0–100.0)
Platelets: 252 10*3/uL (ref 150–400)
RBC: 4.28 MIL/uL (ref 3.87–5.11)
RDW: 13.7 % (ref 11.5–15.5)
WBC: 7.7 10*3/uL (ref 4.0–10.5)

## 2011-04-03 LAB — POCT I-STAT TROPONIN I: Troponin i, poc: 0 ng/mL (ref 0.00–0.08)

## 2011-04-03 LAB — BASIC METABOLIC PANEL
BUN: 9 mg/dL (ref 6–23)
CO2: 28 mEq/L (ref 19–32)
Calcium: 9.4 mg/dL (ref 8.4–10.5)
Chloride: 105 mEq/L (ref 96–112)
Creatinine, Ser: 0.67 mg/dL (ref 0.50–1.10)
GFR calc Af Amer: >90 mL/min (ref >90)
GFR calc non Af Amer: >90 mL/min (ref >90)
Glucose, Bld: 96 mg/dL (ref 70–99)
Potassium: 4 mEq/L (ref 3.5–5.1)
Sodium: 140 mEq/L (ref 135–145)

## 2011-04-03 LAB — TROPONIN I: Troponin I: <0.3 ng/mL (ref <0.30)

## 2011-04-03 MED ORDER — GI COCKTAIL ~~LOC~~
30.0000 mL | Freq: Once | ORAL | Status: AC
  Administered 2011-04-03: 30 mL via ORAL
  Filled 2011-04-03: qty 30

## 2011-04-03 MED ORDER — OMEPRAZOLE 20 MG PO CPDR: 20.0000 mg | DELAYED_RELEASE_CAPSULE | Freq: Every day | ORAL | Status: DC

## 2011-04-03 NOTE — ED Notes (Signed)
Pt was discharged home. D/C instructions, including follow-up care and prescriptions discussed. Pt had no additional questions/concers at this time.

## 2011-04-03 NOTE — ED Notes (Signed)
Patient reports onset of chest pain and left arm pain last night.  Patient states her pain eased off last night but returned today.  Patient states she took tylenol

## 2011-04-03 NOTE — Telephone Encounter (Signed)
Pt calls crying stating she has chest pain and left arm pain since last night, denies shortness of breath, states chest pain is worse, advised to call 911 or go straight to ED preferably call 911, she is agreeable

## 2011-04-03 NOTE — ED Notes (Signed)
Pt on cardiac monitor.

## 2011-04-03 NOTE — ED Notes (Signed)
EKG completed and given to Dr. Cook along with OLD ekg. 

## 2011-04-03 NOTE — ED Provider Notes (Signed)
History     CSN: [id number]  Arrival date & time 04/03/11  1246   First MD Initiated Contact with Patient 04/03/11 1624      Chief Complaint  Patient presents with  . Chest Pain    (Consider location/radiation/quality/duration/timing/severity/associated sxs/prior treatment) HPI Patient reports onset of chest pain and left arm pain last night. Patient states her pain eased off last night but returned today. Patient states she took tylenol.  Chest pain was not brought on by activity not relieved with rest.  Does not associate with diaphoresis, nausea, vomiting, shortness of breath.  Patient had similar pain in the past and had a negative cardiac catheterization done.  See note below.  Past Medical History  Diagnosis Date  . Hypertension   . Hyperlipidemia   . Diabetes mellitus   . Hyperplastic colon polyp 12-2007    Dr. Christella Hartigan    Past Surgical History  Procedure Date  . 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 Stroke Mother     age 64  . Heart attack Father     age 31  . Heart attack Sister     age 42  . Colon cancer      Has an aunt that died of Colon CA (late 50's-early 59's)    History  Substance Use Topics  . Smoking status: Never Smoker   . Smokeless tobacco: Not on file  . Alcohol Use: No    OB History    Grav Para Term Preterm Abortions TAB SAB Ect Mult Living                  Review of Systems  All other systems reviewed and are negative.    Allergies  Review of patient's allergies indicates no known allergies.  Home Medications   Current Outpatient Rx  Name Route Sig Dispense Refill  . ACETAMINOPHEN 500 MG PO TABS Oral Take 1,000 mg by mouth once as needed. For pain.     Marland Kitchen METOPROLOL TARTRATE 100 MG PO TABS Oral Take 100 mg by mouth 2 (two) times daily.      Marland Kitchen NAPROXEN 250 MG PO TABS Oral Take 250 mg by mouth 2 (two) times daily with a meal.      .  QUINAPRIL-HYDROCHLOROTHIAZIDE 20-25 MG PO TABS Oral Take 1 tablet by mouth daily.      Marland Kitchen SIMVASTATIN 40 MG PO TABS Oral Take 40 mg by mouth at bedtime.      . OMEPRAZOLE 20 MG PO CPDR Oral Take 1 capsule (20 mg total) by mouth daily. 30 capsule 0    BP 144/77  Pulse 61  Temp(Src) 98.4 F (36.9 C) (Oral)  Resp 12  Ht 5\' 1"  (1.549 m)  SpO2 99%  Physical Exam  Nursing note and vitals reviewed. Constitutional: She is oriented to person, place, and time. She appears well-developed and well-nourished. No distress.  HENT:  Head: Normocephalic and atraumatic.  Eyes: Pupils are equal, round, and reactive to light.  Neck: Normal range of motion.  Cardiovascular: Normal rate and intact distal pulses.          Date: 04/03/2011  Rate: 66  Rhythm: normal sinus rhythm  QRS Axis: normal  Intervals: normal  ST/T Wave abnormalities: normal  Conduction Disutrbances:none:   Old EKG Reviewed: unchanged     Pulmonary/Chest: No respiratory distress.  Abdominal: Normal appearance. She exhibits no distension.  Musculoskeletal: Normal  range of motion.  Neurological: She is alert and oriented to person, place, and time. No cranial nerve deficit.  Skin: Skin is warm and dry. No rash noted.  Psychiatric: She has a normal mood and affect. Her behavior is normal.    ED Course  Procedures (including critical care time)   Labs Reviewed  TROPONIN I  CBC  BASIC METABOLIC PANEL  POCT I-STAT TROPONIN I  I-STAT TROPONIN I   Dg Chest 2 View  04/03/2011  *RADIOLOGY REPORT*  Clinical Data: Chest pain.  CHEST - 2 VIEW  Comparison: 04/20/2008  Findings: Low lung volumes are and demonstrate, however both lungs remain clear.  No evidence of pleural effusion.  Heart size is within normal limits and stable.  No definite mass or lymphadenopathy identified.  IMPRESSION: Stable exam.  No active disease.  Original Report Authenticated By: Danae Orleans, M.D.     1. Chest pain, non-cardiac       MDM    Results from cardiac catheterization in 2010  RESULTS:  1. The left main coronary artery is widely patent and bifurcates in  the left anterior descending artery and left circumflex artery.  1. The left anterior descending artery is widely patent throughout its  course. The apex giving rise to one diagonal branch which is  widely patent.  1. The left circumflex is widely patent throughout its course and  traverses the AV groove. It gives rise to a large first obtuse  marginal branch which is widely patent. It then gives rise to a  second obtuse marginal branch which is widely patent and terminates  in a third obtuse marginal branch which is widely patent.  1. The right coronary artery is widely patent and bifurcates into the  posterior descending artery and posterolateral artery which are  widely patent.  Left ventriculography shows normal LV function EF 60%, aortic pressure  153/88 mmHg, LV pressure 148/-1 mmHg, LVEDP 15 mmHg. LV function is  normal at 60%.  I suspect this is related to some type of esophageal or GI problem.  Plan is to start her on omeprazole and have her have close followup with her PCP this week.       Nelia Shi, MD 04/03/11 [phone]

## 2011-04-30 ENCOUNTER — Encounter: Payer: Self-pay | Admitting: Internal Medicine

## 2011-06-17 ENCOUNTER — Encounter: Payer: Self-pay | Admitting: Internal Medicine

## 2011-06-17 ENCOUNTER — Ambulatory Visit (INDEPENDENT_AMBULATORY_CARE_PROVIDER_SITE_OTHER): Payer: Medicare Other | Admitting: Internal Medicine

## 2011-06-17 VITALS — BP 105/67 | HR 57 | Temp 96.6°F | Ht 62.0 in | Wt 249.8 lb

## 2011-06-17 DIAGNOSIS — E119 Type 2 diabetes mellitus without complications: Secondary | ICD-10-CM

## 2011-06-17 DIAGNOSIS — Z79899 Other long term (current) drug therapy: Secondary | ICD-10-CM

## 2011-06-17 DIAGNOSIS — K625 Hemorrhage of anus and rectum: Secondary | ICD-10-CM

## 2011-06-17 DIAGNOSIS — E785 Hyperlipidemia, unspecified: Secondary | ICD-10-CM

## 2011-06-17 DIAGNOSIS — R350 Frequency of micturition: Secondary | ICD-10-CM

## 2011-06-17 DIAGNOSIS — I10 Essential (primary) hypertension: Secondary | ICD-10-CM

## 2011-06-17 LAB — POCT URINALYSIS DIPSTICK
Bilirubin, UA: NEGATIVE
Glucose, UA: NEGATIVE
Nitrite, UA: NEGATIVE

## 2011-06-17 MED ORDER — HYDROCORTISONE ACETATE 25 MG RE SUPP: 25.0000 mg | Freq: Two times a day (BID) | RECTAL | Status: AC

## 2011-06-17 MED ORDER — SIMVASTATIN 40 MG PO TABS: 40.0000 mg | ORAL_TABLET | Freq: Every day | ORAL | Status: DC

## 2011-06-17 MED ORDER — DOCUSATE SODIUM 100 MG PO CAPS: 100.0000 mg | ORAL_CAPSULE | Freq: Two times a day (BID) | ORAL | Status: AC

## 2011-06-17 MED ORDER — HYDROCORTISONE ACETATE 25 MG RE SUPP: 25.0000 mg | Freq: Two times a day (BID) | RECTAL | Status: DC

## 2011-06-17 MED ORDER — DOCUSATE SODIUM 100 MG PO CAPS: 100.0000 mg | ORAL_CAPSULE | Freq: Two times a day (BID) | ORAL | Status: DC

## 2011-06-17 NOTE — Patient Instructions (Addendum)
1. Stop taking Ibuprofen 2. Take Naproxen for your knee pain. If your pain is not controlled you can take Tylenol (Acetaminophen) 325 mg every 6 hours  3.Take Colace (stool softener) every day 4. Do warm Sitz bath every day

## 2011-06-17 NOTE — Assessment & Plan Note (Signed)
Will check lipid panel today for possible changes in management.

## 2011-06-17 NOTE — Assessment & Plan Note (Signed)
Likely due to Hemorrhoids. Last Colonoscopy in 2009 by Dr Christella Hartigan was within normal limits. Follow up in 10 years was recommended. Will start Colace for constipation. Hydrocortisone supp for 2 weeks and recommended warm sitz bath and increase fiber. Need to be reevaluated in one month. If no significant improvement consider referral to surgery for hemorrhoid ligation.

## 2011-06-17 NOTE — Assessment & Plan Note (Signed)
Hgb A1c elevated compared to 4 month ago. I will continue to monitor and advised patient diet control.

## 2011-06-17 NOTE — Assessment & Plan Note (Signed)
Blood pressure well controlled. Continue current regimen.  

## 2011-06-17 NOTE — Assessment & Plan Note (Signed)
We will obtain U/A and urine culture for further evaluation and management.

## 2011-06-17 NOTE — Progress Notes (Signed)
Subjective:   Patient ID: [name] female   DOB: [birth year] 56 y.o.   MRN: [id number]  HPI: [name] is a 56 y.o. female with PMH significant as outlined below who presented to the clinic for a regular follow up.  1. Patient noted she has been experiencing increased urinary frequency and occasionally burning sensation. Denies fevers or chills, flank pain, hematuria.  2. Blood per rectum: patient noted that has been having blood in the stool and toilet paper whenever she is straining. She has long history of constipation and has been using stool softener not very frequently. She noted that she does not eat eat fiber due to financial restrictions.  3. DM - currently on no meds.     Past Medical History  Diagnosis Date  . Hypertension   . Hyperlipidemia   . Diabetes mellitus   . Hyperplastic colon polyp 12-2007    Dr. Christella Hartigan   Current Outpatient Prescriptions  Medication Sig Dispense Refill  . acetaminophen (TYLENOL) 500 MG tablet Take 1,000 mg by mouth once as needed. For pain.       . metoprolol (LOPRESSOR) 100 MG tablet Take 100 mg by mouth 2 (two) times daily.        . naproxen (NAPROSYN) 250 MG tablet Take 250 mg by mouth 2 (two) times daily with a meal.        . omeprazole (PRILOSEC) 20 MG capsule Take 1 capsule (20 mg total) by mouth daily.  30 capsule  0  . quinapril-hydrochlorothiazide (ACCURETIC) 20-25 MG per tablet Take 1 tablet by mouth daily.        . simvastatin (ZOCOR) 40 MG tablet Take 40 mg by mouth at bedtime.         Family History  Problem Relation Age of Onset  . Stroke Mother     age 32  . Heart attack Father     age 50  . Heart attack Sister     age 18  . Colon cancer      Has an aunt that died of Colon CA (late 50's-early 64's)   History   Social History  . Marital Status: Married    Spouse Name: N/A    Number of Children: N/A  . Years of Education: N/A   Occupational History  . Unemployed    Social History Main Topics  . Smoking  status: Never Smoker   . Smokeless tobacco: None  . Alcohol Use: No  . Drug Use: No  . Sexually Active: Yes -- Female partner(s)     with seperated husband   Other Topics Concern  . None   Social History Narrative   Lives at home with son.  Separated from husband- still sexually active with him   Review of Systems: Constitutional: Denies fever, chills, diaphoresis, appetite change and fatigue.  Respiratory: Denies SOB, DOE, cough, chest tightness,  and wheezing.   Cardiovascular: Denies chest pain, palpitations and leg swelling.  Gastrointestinal: Denies nausea, vomiting, abdominal pain, diarrhea, but noted constipation, blood in stool and abdominal distention.  Genitourinary: Noted dysuria, urgency, frequency but denies hematuria, flank pain and difficulty urinating.  Neurological: Denies dizziness,light-headedness, numbness and headaches.    Objective:  Physical Exam: Filed Vitals:   06/17/11 1529  BP: 105/67  Pulse: 57  Temp: 96.6 F (35.9 C)  TempSrc: Oral  Height: 5\' 2"  (1.575 m)  Weight: 249 lb 12.8 oz (113.309 kg)  SpO2: 99%   Constitutional: Vital signs reviewed.  Patient is a well-developed  and well-nourished  in no acute distress and cooperative with exam. Alert and oriented x3.  Neck: Supple,  Cardiovascular: RRR, S1 normal, S2 normal, no MRG, pulses symmetric and intact bilaterally Pulmonary/Chest: CTAB, no wheezes, rales, or rhonchi Abdominal: Soft. Non-tender, non-distended, bowel sounds are normal,  GU: no CVA tenderness Rectum: Hemorrhoids are prolapsed and can not be reduced manually. Blood present on rectal exam. No stool in rectal vault.

## 2011-06-18 LAB — URINALYSIS, ROUTINE W REFLEX MICROSCOPIC
Bilirubin Urine: NEGATIVE
Ketones, ur: NEGATIVE mg/dL
Protein, ur: NEGATIVE mg/dL
Urobilinogen, UA: 1 mg/dL (ref 0.0–1.0)

## 2011-06-18 LAB — LIPID PANEL
Cholesterol: 194 mg/dL (ref 0–200)
Total CHOL/HDL Ratio: 4 Ratio
VLDL: 16 mg/dL (ref 0–40)

## 2011-06-18 LAB — CBC
HCT: 40.1 % (ref 36.0–46.0)
Hemoglobin: 13.3 g/dL (ref 12.0–15.0)
MCH: 30.5 pg (ref 26.0–34.0)
MCV: 92 fL (ref 78.0–100.0)
RBC: 4.36 MIL/uL (ref 3.87–5.11)

## 2011-06-18 LAB — URINALYSIS, MICROSCOPIC ONLY
Casts: NONE SEEN
Crystals: NONE SEEN

## 2011-06-19 LAB — URINE CULTURE

## 2011-07-01 ENCOUNTER — Other Ambulatory Visit: Payer: Self-pay | Admitting: Internal Medicine

## 2011-07-01 DIAGNOSIS — Z1231 Encounter for screening mammogram for malignant neoplasm of breast: Secondary | ICD-10-CM

## 2011-07-19 ENCOUNTER — Encounter: Payer: Medicare Other | Admitting: Internal Medicine

## 2011-07-25 ENCOUNTER — Ambulatory Visit (HOSPITAL_COMMUNITY): Payer: Medicare Other

## 2011-08-05 ENCOUNTER — Other Ambulatory Visit: Payer: Self-pay | Admitting: Internal Medicine

## 2011-08-05 DIAGNOSIS — I10 Essential (primary) hypertension: Secondary | ICD-10-CM

## 2011-08-09 ENCOUNTER — Emergency Department (HOSPITAL_COMMUNITY)
Admission: EM | Admit: 2011-08-09 | Discharge: 2011-08-09 | Disposition: A | Discharge status: Home / Self Care | Payer: Medicare Other | Attending: Emergency Medicine | Admitting: Emergency Medicine

## 2011-08-09 ENCOUNTER — Encounter (HOSPITAL_COMMUNITY): Payer: Self-pay | Admitting: Emergency Medicine

## 2011-08-09 DIAGNOSIS — Z79899 Other long term (current) drug therapy: Secondary | ICD-10-CM | POA: Insufficient documentation

## 2011-08-09 DIAGNOSIS — H9209 Otalgia, unspecified ear: Secondary | ICD-10-CM | POA: Insufficient documentation

## 2011-08-09 DIAGNOSIS — T169XXA Foreign body in ear, unspecified ear, initial encounter: Secondary | ICD-10-CM | POA: Insufficient documentation

## 2011-08-09 DIAGNOSIS — E119 Type 2 diabetes mellitus without complications: Secondary | ICD-10-CM | POA: Insufficient documentation

## 2011-08-09 DIAGNOSIS — E785 Hyperlipidemia, unspecified: Secondary | ICD-10-CM | POA: Insufficient documentation

## 2011-08-09 DIAGNOSIS — I10 Essential (primary) hypertension: Secondary | ICD-10-CM | POA: Insufficient documentation

## 2011-08-09 MED ORDER — NEOMYCIN-POLYMYXIN-HC 3.5-10000-1 OT SUSP
3.0000 [drp] | Freq: Once | OTIC | Status: AC
  Administered 2011-08-09: 3 [drp] via OTIC
  Filled 2011-08-09: qty 10

## 2011-08-09 NOTE — ED Notes (Signed)
PT. REPORTS RIGHT EAR ACHE , INSECT ENTERED RIGHT EAR THIS EVENING .

## 2011-08-09 NOTE — ED Provider Notes (Signed)
History     CSN: [id number]  Arrival date & time 08/09/11  0031   First MD Initiated Contact with Patient 08/09/11 0055      Chief Complaint  Patient presents with  . Otalgia    HPI  Hx provided by pt.  Pt is a 56 yo female with PMH of HTN, DM who presents with complaints of rt ear pain. Pt reports waking up with pain in rt ear and sensation of insect or movement in ear.  Pt has not done anything for the pain.  She denies any hearing loss, denies dizziness or vertigo.  Pain is mild to moderate. Pain is sharp. Patient denies any other symptoms. Patient denies any other aggravating or alleviating factors.     Past Medical History  Diagnosis Date  . Hypertension   . Hyperlipidemia   . Diabetes mellitus   . Hyperplastic colon polyp 12-2007    Dr. Christella Hartigan    Past Surgical History  Procedure Date  . 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 Stroke Mother     age 19  . Heart attack Father     age 45  . Heart attack Sister     age 6  . Colon cancer      Has an aunt that died of Colon CA (late 50's-early 36's)    History  Substance Use Topics  . Smoking status: Never Smoker   . Smokeless tobacco: Not on file  . Alcohol Use: No    OB History    Grav Para Term Preterm Abortions TAB SAB Ect Mult Living                  Review of Systems  Constitutional: Negative for fever and diaphoresis.  HENT: Positive for ear pain. Negative for hearing loss, congestion, sore throat and rhinorrhea.   Gastrointestinal: Negative for nausea and vomiting.    Allergies  Review of patient's allergies indicates no known allergies.  Home Medications   Current Outpatient Rx  Name Route Sig Dispense Refill  . DOCUSATE SODIUM 100 MG PO CAPS Oral Take 1 capsule (100 mg total) by mouth 2 (two) times daily. 60 capsule 1  . METOPROLOL TARTRATE 100 MG PO TABS Oral Take 100 mg by mouth 2 (two) times daily.       Marland Kitchen NAPROXEN 250 MG PO TABS Oral Take 250 mg by mouth 2 (two) times daily with a meal.      . OMEPRAZOLE 20 MG PO CPDR Oral Take 1 capsule (20 mg total) by mouth daily. 30 capsule 0  . QUINAPRIL-HYDROCHLOROTHIAZIDE 20-25 MG PO TABS  TAKE 1 TABLET DAILY 30 tablet 2  . SIMVASTATIN 40 MG PO TABS Oral Take 1 tablet (40 mg total) by mouth at bedtime. 30 tablet 2    BP 128/86  Pulse 81  Temp(Src) 98.2 F (36.8 C) (Oral)  SpO2 97%  Physical Exam  Nursing note and vitals reviewed. Constitutional: She is oriented to person, place, and time. She appears well-developed and well-nourished. No distress.  HENT:  Head: Normocephalic.       Small insect in right ear.  Cardiovascular: Normal rate and regular rhythm.   Pulmonary/Chest: Effort normal and breath sounds normal.  Neurological: She is alert and oriented to person, place, and time.  Skin: Skin is warm.  Psychiatric: She has a normal mood and affect.    ED Course  FOREIGN  BODY REMOVAL Date/Time: 08/09/2011 2:10 AM Performed by: Angus Seller Authorized by: Angus Seller Consent: Verbal consent obtained. Risks and benefits: risks, benefits and alternatives were discussed Consent given by: patient Patient identity confirmed: verbally with patient Time out: Immediately prior to procedure a "time out" was called to verify the correct patient, procedure, equipment, support staff and site/side marked as required. Body area: ear Location details: right ear Localization method: visualized and ENT speculum Removal mechanism: alligator forceps and irrigation Complexity: simple 1 objects recovered. Objects recovered: Insect Post-procedure assessment: foreign body removed Patient tolerance: Patient tolerated the procedure well with no immediate complications.        1. Foreign body in auditory canal       MDM  Pt seen and evaluated.  Pt in no acute distress.        Angus Seller, [place] 08/09/11 [phone]

## 2011-08-09 NOTE — ED Provider Notes (Signed)
Medical screening examination/treatment/procedure(s) were performed by non-physician practitioner and as supervising physician I was immediately available for consultation/collaboration.  Olivia Mackie, MD 08/09/11 [phone]

## 2011-08-09 NOTE — Discharge Instructions (Signed)
You were seen and evaluated for an insect in your ear. This was removed from your air. You're also given antibiotic eardrops to prevent an infection to your ear. Please use these by placing 4 drops in your ear 3 times a day for the next 5-7 days. Please followup with your primary care provider for reevaluation of your ear to make sure it is healing properly.   Ear Foreign Body An ear foreign body is an object that is stuck in the ear. It is common for young children to put objects into the ear canal. These may include pebbles, beads, beans, and any other small objects which will fit. In adults, objects such as cotton swabs may become lodged in the ear canal. In all ages, the most common foreign bodies are insects that enter the ear canal.  SYMPTOMS  Foreign bodies may cause pain, buzzing or roaring sounds, hearing loss, and ear drainage.  HOME CARE INSTRUCTIONS   Keep all follow-up appointments with your caregiver as told.   Keep small objects out of reach of young children. Tell them not to put anything in their ears.  SEEK IMMEDIATE MEDICAL CARE IF:   You have bleeding from the ear.   You have increased pain or swelling of the ear.   You have reduced hearing.   You have discharge coming from the ear.   You have a fever.   You have a headache.  MAKE SURE YOU:   Understand these instructions.   Will watch your condition.   Will get help right away if you are not doing well or get worse.  Document Released: 03/15/2000 Document Revised: 03/07/2011 Document Reviewed: 11/04/2007 [facility] Patient Information 2012 [place], [place].   RESOURCE GUIDE  Dental Problems  Patients with Medicaid: [facility] [phone] [address]                                           [phone] [address] Phone:  [phone]                                                  Phone:  [phone]  If unable to pay or uninsured, contact:  [facility] or  [facility]. to become qualified for the adult dental clinic.  Chronic Pain Problems Contact [facility]  [phone] Patients need to be referred by their primary care doctor.  Insufficient Money for Medicine Contact United Way:  call "211" or [facility] 505-417-0067.  No Primary Care Doctor Call [facility]  470-830-8711 Other agencies that provide inexpensive medical care    [facility]  [phone]    [facility]  [phone]    [facility]  [phone]    [facility]  [phone]    [facility]  [phone]    [facility]  [phone]  Psychological Services [facility]  [phone] [facility]  [phone] [facility]   [phone] (emergency services [phone])  Substance Abuse Resources [facility]  7757356628 [facility] 217-532-1631 [employer]  [employer] 513-848-8422 [facility] 747-684-2090 Residential & Outpatient Substance Abuse Program  [phone]  Abuse/Neglect [facility] Child Abuse Hotline (701)824-4363 [facility] Child Abuse Hotline (719) 472-4714 (After Hours)  Emergency Shelter [facility] Minist[facility] 803-166-2265  M[facility] [facility] 636-068-5343 [facility] [phone]  MRSA Hotline #:   3126362353    [facility] Resources  [facility]     United Way                          [facility] [address] [place]                       [address]      [address]  Blondell Reveal Phone:  [phone]                                   Phone:  [phone]                 Phone:  [phone]  [facility] Phone:  [phone]  [facility] Child Abuse Hotline 828 067 2066 [phone] (After Hours)

## 2011-08-20 ENCOUNTER — Ambulatory Visit (HOSPITAL_COMMUNITY)
Admission: RE | Admit: 2011-08-20 | Discharge: 2011-08-20 | Disposition: A | Discharge status: Home / Self Care | Payer: Medicare Other | Source: Ambulatory Visit | Attending: Internal Medicine | Admitting: Internal Medicine

## 2011-08-20 DIAGNOSIS — Z1231 Encounter for screening mammogram for malignant neoplasm of breast: Secondary | ICD-10-CM | POA: Insufficient documentation

## 2011-08-23 ENCOUNTER — Other Ambulatory Visit: Payer: Self-pay | Admitting: Internal Medicine

## 2011-08-23 DIAGNOSIS — R928 Other abnormal and inconclusive findings on diagnostic imaging of breast: Secondary | ICD-10-CM

## 2011-09-02 ENCOUNTER — Other Ambulatory Visit: Payer: Self-pay | Admitting: Internal Medicine

## 2011-09-02 DIAGNOSIS — I10 Essential (primary) hypertension: Secondary | ICD-10-CM

## 2011-09-03 ENCOUNTER — Ambulatory Visit
Admission: RE | Admit: 2011-09-03 | Discharge: 2011-09-03 | Disposition: A | Discharge status: Home / Self Care | Payer: Medicare Other | Source: Ambulatory Visit | Attending: Internal Medicine | Admitting: Internal Medicine

## 2011-09-03 DIAGNOSIS — R928 Other abnormal and inconclusive findings on diagnostic imaging of breast: Secondary | ICD-10-CM

## 2011-11-18 ENCOUNTER — Encounter: Payer: Medicare Other | Admitting: Internal Medicine

## 2011-11-21 ENCOUNTER — Encounter: Payer: Medicare Other | Admitting: Internal Medicine

## 2011-11-27 ENCOUNTER — Encounter: Payer: Self-pay | Admitting: Internal Medicine

## 2011-11-27 ENCOUNTER — Ambulatory Visit (INDEPENDENT_AMBULATORY_CARE_PROVIDER_SITE_OTHER): Payer: Medicare Other | Admitting: Internal Medicine

## 2011-11-27 VITALS — BP 133/86 | HR 57 | Temp 96.3°F | Ht 62.0 in | Wt 250.9 lb

## 2011-11-27 DIAGNOSIS — Z79899 Other long term (current) drug therapy: Secondary | ICD-10-CM

## 2011-11-27 DIAGNOSIS — I10 Essential (primary) hypertension: Secondary | ICD-10-CM

## 2011-11-27 DIAGNOSIS — E785 Hyperlipidemia, unspecified: Secondary | ICD-10-CM

## 2011-11-27 DIAGNOSIS — R079 Chest pain, unspecified: Secondary | ICD-10-CM

## 2011-11-27 DIAGNOSIS — E119 Type 2 diabetes mellitus without complications: Secondary | ICD-10-CM

## 2011-11-27 DIAGNOSIS — K625 Hemorrhage of anus and rectum: Secondary | ICD-10-CM

## 2011-11-27 DIAGNOSIS — R350 Frequency of micturition: Secondary | ICD-10-CM

## 2011-11-27 LAB — POCT URINALYSIS DIPSTICK
Bilirubin, UA: NEGATIVE
Blood, UA: NEGATIVE
Glucose, UA: NEGATIVE
Spec Grav, UA: 1.025

## 2011-11-27 LAB — COMPREHENSIVE METABOLIC PANEL
Alkaline Phosphatase: 40 U/L (ref 39–117)
BUN: 6 mg/dL (ref 6–23)
CO2: 31 mEq/L (ref 19–32)
Creat: 0.83 mg/dL (ref 0.50–1.10)
Glucose, Bld: 85 mg/dL (ref 70–99)
Sodium: 141 mEq/L (ref 135–145)
Total Bilirubin: 0.4 mg/dL (ref 0.3–1.2)
Total Protein: 6.8 g/dL (ref 6.0–8.3)

## 2011-11-27 NOTE — Progress Notes (Signed)
Subjective:   Patient ID: [name] female   DOB: 1955-06-18 56 y.o.   MRN: [id number]  HPI: [name] is a 56 y.o. female with PMH significant as outlined below who presented to the clinic for multiple problems. 1. Urinary frequency: patient reports that she has been urinary frequency since more then 2 month. It is occasionally associated with burning sensation and the feeling of incomplete voiding. She further reports that she started to have flank pain on the left side which started today. She further noted that she has episode of incontinence. Denies any fevers or chills.   2. Blood per rectum: it was always occurs whenever she has episode of constipation . She was prescribed colace but she has been not using any on a daily basis.   3. Chest pain: it comes an goes, aggravating factors are drinking liquids. No alleviating factors. Non-radiating.  Not associated with SOB, palpitation, dizziness. Not aggravated by exercise (walking)     Past Medical History  Diagnosis Date  . Hypertension   . Hyperlipidemia   . Diabetes mellitus   . Hyperplastic colon polyp 12-2007    Dr. Christella Hartigan   Current Outpatient Prescriptions  Medication Sig Dispense Refill  . docusate sodium (COLACE) 100 MG capsule Take 1 capsule (100 mg total) by mouth 2 (two) times daily.  60 capsule  1  . metoprolol (LOPRESSOR) 100 MG tablet Take 100 mg by mouth 2 (two) times daily.        . naproxen (NAPROSYN) 250 MG tablet Take 250 mg by mouth 2 (two) times daily with a meal.        . omeprazole (PRILOSEC) 20 MG capsule Take 1 capsule (20 mg total) by mouth daily.  30 capsule  0  . quinapril-hydrochlorothiazide (ACCURETIC) 20-25 MG per tablet TAKE 1 TABLET DAILY  30 tablet  1  . simvastatin (ZOCOR) 40 MG tablet Take 1 tablet (40 mg total) by mouth at bedtime.  30 tablet  2   Family History  Problem Relation Age of Onset  . Stroke Mother     age 22  . Heart attack Father     age 77  . Heart attack Sister    age 59  . Colon cancer      Has an aunt that died of Colon CA (late 50's-early 19's)   History   Social History  . Marital Status: Married    Spouse Name: N/A    Number of Children: N/A  . Years of Education: N/A   Occupational History  . Unemployed    Social History Main Topics  . Smoking status: Never Smoker   . Smokeless tobacco: None  . Alcohol Use: No  . Drug Use: No  . Sexually Active: Yes -- Female partner(s)     with seperated husband   Other Topics Concern  . None   Social History Narrative   Lives at home with son.  Separated from husband- still sexually active with him   Review of Systems: Constitutional: Denies fever, chills, diaphoresis, appetite change and fatigue.    Respiratory: Denies SOB, DOE, cough, chest tightness,  and wheezing.   Cardiovascular: Denies , palpitations and leg swelling.  Gastrointestinal: Denies nausea, vomiting, abdominal pain, diarrhea, constipation, blood in stool and abdominal distention.  Genitourinary: Noted dysuria, urgency, frequency,  flank pain but denies hematuria.   Neurological: Denies dizziness, numbness and headaches.    Objective:  Physical Exam: Filed Vitals:   11/27/11 1451  BP: 133/86  Pulse: 57  Temp: 96.3 F (35.7 C)  TempSrc: Oral  Height: 5\' 2"  (1.575 m)  Weight: 250 lb 14.4 oz (113.807 kg)  SpO2: 97%   Constitutional: Vital signs reviewed.  Patient is a obese woman in no acute distress and cooperative with exam. Alert and oriented x3.   Cardiovascular: RRR, S1 normal, S2 normal, no MRG, pulses symmetric and intact bilaterally Pulmonary/Chest: CTAB, no wheezes, rales, or rhonchi Abdominal: Soft. Non-tender, non-distended, bowel sounds are normal, no masses,  Musculoskeletal: left Lumbar paraspinal area: mild tenderness on palpation .No  erythema, or stiffness, ROM full  Neurological: A&O x3, Strength is normal and symmetric bilaterally, no focal motor deficit, sensory intact to light touch bilaterally.    Skin: Warm, dry and intact. No rash.

## 2011-11-27 NOTE — Patient Instructions (Addendum)
Please take your stool softener twice a day until you have 2 bowel movements a day. Then go down to take once a day . If you have bowel movements every day then try to stop it . If you have no bowel movement for 2 days then start taking it again ( twice a day)   If your chest pain worsen please call the clinic or go to the ED for further evaluation

## 2011-11-28 ENCOUNTER — Other Ambulatory Visit: Payer: Self-pay | Admitting: Internal Medicine

## 2011-11-28 DIAGNOSIS — R079 Chest pain, unspecified: Secondary | ICD-10-CM | POA: Insufficient documentation

## 2011-11-28 DIAGNOSIS — I10 Essential (primary) hypertension: Secondary | ICD-10-CM

## 2011-11-28 LAB — URINALYSIS, ROUTINE W REFLEX MICROSCOPIC
Glucose, UA: NEGATIVE mg/dL
Leukocytes, UA: NEGATIVE
Protein, ur: NEGATIVE mg/dL
Specific Gravity, Urine: 1.016 (ref 1.005–1.030)

## 2011-11-28 NOTE — Assessment & Plan Note (Signed)
Likely due to hemorrhoids. I offered surgical intervention but patient declined. I advised her to take the colace every day until she has atleast 2 BM a day and then cut back gradually. I further reccommended  sitz bath and patient noted that she want to try this first before surgical intervention. During next office visit a CBC should be obtained.

## 2011-11-28 NOTE — Assessment & Plan Note (Signed)
Blood pressure well controlled. Continue current regimen including Accuretic and Metoprolol.

## 2011-11-28 NOTE — Assessment & Plan Note (Signed)
Unlikely cardiac in origin. I recommended if pain worsens and is associated with SOB, dizziness, numbness in arms to call the office or go to the ED for further evaluation.

## 2011-11-28 NOTE — Assessment & Plan Note (Signed)
Patient was started during last office visit on Simvastatin. Need to check Lipid panel during next office visit.

## 2011-11-28 NOTE — Assessment & Plan Note (Signed)
Hgb A1c 6.5 . Currently on no medication. We will continue to monitor.

## 2011-11-28 NOTE — Assessment & Plan Note (Signed)
I will obtain U/A and urine culture. If this is within normal limits I will refer patient to Urology for further evaluation and management.

## 2011-11-29 LAB — URINE CULTURE
Colony Count: NO GROWTH
Organism ID, Bacteria: NO GROWTH

## 2012-01-22 NOTE — Addendum Note (Signed)
Addended by: Dorie Rank E on: 01/22/2012 10:07 AM   Modules accepted: Orders

## 2012-02-17 ENCOUNTER — Encounter: Payer: Medicare Other | Admitting: Internal Medicine

## 2012-03-30 ENCOUNTER — Encounter: Payer: Self-pay | Admitting: Internal Medicine

## 2012-04-03 ENCOUNTER — Other Ambulatory Visit: Payer: Self-pay | Admitting: Internal Medicine

## 2012-04-03 DIAGNOSIS — I10 Essential (primary) hypertension: Secondary | ICD-10-CM

## 2012-05-04 ENCOUNTER — Ambulatory Visit (INDEPENDENT_AMBULATORY_CARE_PROVIDER_SITE_OTHER): Payer: Medicare Other | Admitting: Internal Medicine

## 2012-05-04 ENCOUNTER — Encounter: Payer: Self-pay | Admitting: Internal Medicine

## 2012-05-04 ENCOUNTER — Ambulatory Visit (HOSPITAL_COMMUNITY)
Admission: RE | Admit: 2012-05-04 | Discharge: 2012-05-04 | Disposition: A | Discharge status: Home / Self Care | Payer: Medicare Other | Source: Ambulatory Visit | Attending: Internal Medicine | Admitting: Internal Medicine

## 2012-05-04 VITALS — BP 132/89 | HR 75 | Temp 96.2°F | Ht 62.0 in | Wt 245.9 lb

## 2012-05-04 DIAGNOSIS — R509 Fever, unspecified: Secondary | ICD-10-CM | POA: Insufficient documentation

## 2012-05-04 DIAGNOSIS — R059 Cough, unspecified: Secondary | ICD-10-CM

## 2012-05-04 DIAGNOSIS — I10 Essential (primary) hypertension: Secondary | ICD-10-CM

## 2012-05-04 DIAGNOSIS — E119 Type 2 diabetes mellitus without complications: Secondary | ICD-10-CM

## 2012-05-04 DIAGNOSIS — Z79899 Other long term (current) drug therapy: Secondary | ICD-10-CM

## 2012-05-04 DIAGNOSIS — R05 Cough: Secondary | ICD-10-CM

## 2012-05-04 DIAGNOSIS — Z23 Encounter for immunization: Secondary | ICD-10-CM | POA: Insufficient documentation

## 2012-05-04 DIAGNOSIS — R269 Unspecified abnormalities of gait and mobility: Secondary | ICD-10-CM

## 2012-05-04 DIAGNOSIS — E785 Hyperlipidemia, unspecified: Secondary | ICD-10-CM

## 2012-05-04 DIAGNOSIS — K625 Hemorrhage of anus and rectum: Secondary | ICD-10-CM

## 2012-05-04 DIAGNOSIS — R413 Other amnesia: Secondary | ICD-10-CM

## 2012-05-04 LAB — CBC
MCH: 30.5 pg (ref 26.0–34.0)
MCV: 89.1 fL (ref 78.0–100.0)
Platelets: 252 10*3/uL (ref 150–400)
RBC: 4.3 MIL/uL (ref 3.87–5.11)
RDW: 13.8 % (ref 11.5–15.5)

## 2012-05-04 LAB — LIPID PANEL
Cholesterol: 177 mg/dL (ref 0–200)
HDL: 51 mg/dL (ref >39)
Total CHOL/HDL Ratio: 3.5 Ratio

## 2012-05-04 LAB — BASIC METABOLIC PANEL
Calcium: 9.3 mg/dL (ref 8.4–10.5)
Creat: 0.71 mg/dL (ref 0.50–1.10)

## 2012-05-04 LAB — GLUCOSE, CAPILLARY: Glucose-Capillary: 125 mg/dL — ABNORMAL HIGH (ref 70–99)

## 2012-05-04 MED ORDER — LEVOFLOXACIN 500 MG PO TABS: 500.0000 mg | ORAL_TABLET | Freq: Every day | ORAL | Status: DC

## 2012-05-04 MED ORDER — BENZONATATE 100 MG PO CAPS: 100.0000 mg | ORAL_CAPSULE | Freq: Four times a day (QID) | ORAL | Status: DC | PRN

## 2012-05-04 NOTE — Progress Notes (Signed)
Subjective:   Patient ID: [name] female   DOB: February 14, 1956 57 y.o.   MRN: [id number]  HPI: [name] is a 57 y.o. with PMH significant as outlined below who presented to the clinic for multiple problem:  Fall: Patient reports that she slit on the ice on Saturday and fell.  She fell on her chest and abdomen as well as left knee. Since then she is experiencing muscle aches all over the body. Denies any chest  Pain or SOB.   Memory problem and in balance: Has to right down things and people have to explain things more often. Whenever she reads things she needs a lot of time to understand.  Denies any fall prior to the Saturday but notices some imbalance.  Her knees would give out on her .Denies any numbness or tingling. Patient reports headache when he had upper respiratory symptoms.   Patient further reports that she has no appetite . If she takes ibuprofen she has night sweats. Denies any numbness or tingling. Patient reports headache when he had upper respiratory symptoms.    Cough: 2 weeks ago started to have upper resp symptoms ago along with fevers or chill. Had productive cough with green sputum which is still present.    Blood in the stool: resolved after taking stool softener.         Past Medical History  Diagnosis Date  . Hypertension   . Hyperlipidemia   . Diabetes mellitus   . Hyperplastic colon polyp 12-2007    Dr. Christella Hartigan   Current Outpatient Prescriptions  Medication Sig Dispense Refill  . docusate sodium (COLACE) 100 MG capsule Take 1 capsule (100 mg total) by mouth 2 (two) times daily.  60 capsule  1  . metoprolol (LOPRESSOR) 100 MG tablet TAKE 1 TABLET TWICE DAILY  60 tablet  0  . naproxen (NAPROSYN) 250 MG tablet Take 250 mg by mouth 2 (two) times daily with a meal.        . omeprazole (PRILOSEC) 20 MG capsule Take 1 capsule (20 mg total) by mouth daily.  30 capsule  0  . quinapril-hydrochlorothiazide (ACCURETIC) 20-25 MG per tablet TAKE 1 TABLET DAILY   30 tablet  1  . simvastatin (ZOCOR) 40 MG tablet Take 1 tablet (40 mg total) by mouth at bedtime.  30 tablet  2   Family History  Problem Relation Age of Onset  . Stroke Mother     age 71  . Heart attack Father     age 87  . Heart attack Sister     age 29  . Colon cancer      Has an aunt that died of Colon CA (late 50's-early 70's)   History   Social History  . Marital Status: Married    Spouse Name: N/A    Number of Children: N/A  . Years of Education: N/A   Occupational History  . Unemployed    Social History Main Topics  . Smoking status: Never Smoker   . Smokeless tobacco: None  . Alcohol Use: No  . Drug Use: No  . Sexually Active: None     Comment: with seperated husband   Other Topics Concern  . None   Social History Narrative   Lives at home with son.  Separated from husband- still sexually active with him   Review of Systems: Constitutional: Noted fever, chills,  diaphoresis, appetite change and fatigue.  HEENT: Noted congestion, sore throat, rhinorrhea   Respiratory: Noted  some  SOB cough, chest tightness,  and wheezing.   Cardiovascular: Denies chest pain, palpitations and leg swelling.  Gastrointestinal: Denies nausea, vomiting, abdominal pain, diarrhea, constipation, blood in stool and abdominal distention.  Genitourinary: Denies dysuria, urgency, frequency, hematuria, flank pain and difficulty urinating.  Musculoskeletal: Noted myalgias, back pain, joint swelling, arthralgias and gait problem.  Neurological: Mild  Dizziness ,  weakness, light-headedness, numbness and headaches but denies denies seizures, syncope,  Objective:  Physical Exam: Filed Vitals:   05/04/12 1408  BP: 132/89  Pulse: 75  Temp: 96.2 F (35.7 C)  TempSrc: Oral  Height: 5\' 2"  (1.575 m)  Weight: 245 lb 14.4 oz (111.54 kg)  SpO2: 97%   Constitutional: Vital signs reviewed.  Patient is a well-developed and well-nourished female  in no acute distress and cooperative with  exam. Alert and oriented x3.  Head: Normocephalic and atraumatic Eyes: PERRL, EOMI, conjunctivae normal, No scleral icterus.  Neck: Supple,  carotid bruit present.  Cardiovascular: RRR, S1 normal, S2 normal, no MRG, pulses symmetric and intact bilaterally Pulmonary/Chest: rhonchi present at the basis Abdominal: Soft. Non-tender, non-distended, bowel sounds are normal, Musculoskeletal: No joint deformities, erythema, but decreased  Stiffness and  ROM as well as tenderness Hematology: no cervical, inginal, or axillary adenopathy.  Neurological: A&O x3, Strength is normal and symmetric bilaterally, cranial nerve II-XII are grossly intact, no focal motor deficit, sensory intact to light touch bilaterally , cerebellar function intact, Romberg negative. Not able to do heel to heel .

## 2012-05-05 LAB — RPR

## 2012-05-05 NOTE — Assessment & Plan Note (Signed)
Unclear etiology at this point. Considering the concern of gait instability along with memory impairment I will a CT head without contrast. Furthermore I will obtain TSH, vitamin B12 level and RPR.

## 2012-05-05 NOTE — Assessment & Plan Note (Signed)
It seems to be this is most likely related to osteoarthritis of both knees. But considering her memory loss along with complaints of gait instability I will obtain a CT head without contrast. Furthermore we'll obtain vitamin B12 level.

## 2012-05-05 NOTE — Assessment & Plan Note (Signed)
Hemoglobin A1c is well controlled on no medication. I will continue to monitor.  Lab Results  Component Value Date   HGBA1C 6.3 05/04/2012

## 2012-05-05 NOTE — Assessment & Plan Note (Signed)
Productive cough with history of fevers and chills. I will obtain a chest x-ray. I will start patient on Levaquin 5 mg daily for 7 days.

## 2012-05-11 ENCOUNTER — Encounter: Payer: Self-pay | Admitting: Internal Medicine

## 2012-05-11 ENCOUNTER — Ambulatory Visit (HOSPITAL_COMMUNITY)
Admission: RE | Admit: 2012-05-11 | Discharge: 2012-05-11 | Disposition: A | Discharge status: Home / Self Care | Payer: Medicare Other | Source: Ambulatory Visit | Attending: Internal Medicine | Admitting: Internal Medicine

## 2012-05-11 ENCOUNTER — Ambulatory Visit (INDEPENDENT_AMBULATORY_CARE_PROVIDER_SITE_OTHER): Payer: Medicare Other | Admitting: Internal Medicine

## 2012-05-11 VITALS — BP 140/83 | HR 73 | Temp 97.0°F | Ht 61.0 in | Wt 252.0 lb

## 2012-05-11 DIAGNOSIS — Z23 Encounter for immunization: Secondary | ICD-10-CM

## 2012-05-11 DIAGNOSIS — R42 Dizziness and giddiness: Secondary | ICD-10-CM

## 2012-05-11 DIAGNOSIS — W009XXA Unspecified fall due to ice and snow, initial encounter: Secondary | ICD-10-CM

## 2012-05-11 DIAGNOSIS — R413 Other amnesia: Secondary | ICD-10-CM | POA: Insufficient documentation

## 2012-05-11 DIAGNOSIS — R269 Unspecified abnormalities of gait and mobility: Secondary | ICD-10-CM | POA: Insufficient documentation

## 2012-05-11 DIAGNOSIS — E119 Type 2 diabetes mellitus without complications: Secondary | ICD-10-CM

## 2012-05-11 MED ORDER — NAPROXEN 250 MG PO TABS: 250.0000 mg | ORAL_TABLET | Freq: Two times a day (BID) | ORAL | Status: DC

## 2012-05-11 NOTE — Progress Notes (Signed)
Subjective:     Patient ID: [name], female   DOB: [birth year], 57 y.o.   MRN: [id number]  HPI The patient is a 57 year old female who comes in today for a one-week followup visit. She was seen one week ago and was having some cough, had fallen, with some dizziness. She did have multiple tests ordered and the results of those were all reviewed and were all within normal parameters. These results were discussed with her. She also had CT of the head done which was normal and which was reviewed with her. She states that she did trip on ice and fell. She is having some shoulder pain as a result of that which has not quite alleviated. Her cough has gotten better and she has been taking Levaquin daily. She states she has one day left. She has not had any fevers or chills at home. She has not fallen again in the last week. She is occasionally taking Tessalon Perles however less frequently than she was one week ago. She does still have pain in her knees from her osteoarthritis however this is stable. The dizziness that she was having was upon standing. She states that it takes a few seconds to alleviate however if she is able to stand up slowly or hold onto something she does not lose her balance and fall. She states that after her surgery she has had some memory problems however they have been stable for years since the surgery. 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 She would like to get flu vaccine at today's visit.  Review of Systems  Constitutional: Negative for fever, chills, diaphoresis, activity change, appetite change, fatigue and unexpected weight change.  HENT: Negative.   Eyes: Negative.   Respiratory: Positive for cough. Negative for choking, chest tightness, shortness of breath and wheezing.   Cardiovascular: Negative for chest pain, palpitations and leg swelling.  Gastrointestinal: Negative for nausea, vomiting and abdominal pain.  Musculoskeletal: Positive for myalgias and  arthralgias. Negative for back pain, joint swelling and gait problem.  Skin: Negative for color change, pallor, rash and wound.  Neurological: Positive for dizziness. Negative for tremors, seizures, syncope, facial asymmetry, speech difficulty, weakness, light-headedness, numbness and headaches.  Psychiatric/Behavioral: Positive for confusion and decreased concentration. Negative for suicidal ideas, hallucinations, behavioral problems, sleep disturbance, self-injury, dysphoric mood and agitation. The patient is not nervous/anxious and is not hyperactive.        Objective:   Physical Exam  Constitutional: She is oriented to person, place, and time. She appears well-developed and well-nourished.  Obese  HENT:  Head: Normocephalic and atraumatic.  Eyes: EOM are normal. Pupils are equal, round, and reactive to light.  Neck: Normal range of motion. Neck supple.  Cardiovascular: Normal rate and regular rhythm.   Pulmonary/Chest: Effort normal and breath sounds normal. No respiratory distress. She has no wheezes. She has no rales. She exhibits no tenderness.  Some shoulder discomfort, especially on the left.  Abdominal: Soft. Bowel sounds are normal. She exhibits no distension. There is no tenderness. There is no rebound.  Musculoskeletal: Normal range of motion. She exhibits tenderness. She exhibits no edema.  Neurological: She is alert and oriented to person, place, and time.  Skin: Skin is warm and dry.  Psychiatric: She has a normal mood and affect. Her behavior is normal.       Assessment/Plan:   1. Followup of fall-the patient is still having some slight discomfort in her shoulder and will refill naproxen which she was  given for pain. Advised her that this pain may take a week or 2 to alleviate them that she should continue to try to use the arm normally so that it does not stiffen.   2. Dizziness-the patient sounds like she is having a little bit of orthostatic hypotension likely from  metoprolol. It sounds as though it clears within a second or 2 and does not need treatment. Her heart rate is within normal parameters at today's visit and thus dosing adjustment of metoprolol is not indicated. This can be a consideration if she continues to have the symptoms.  3. Disposition-the patient will be seen back in 2 months for followup visit. Gave her some instructions regarding fall safety prevention at home. Advised her to stand up slowly and to take her time. Patient was given flu shot at today's visit.

## 2012-05-11 NOTE — Patient Instructions (Addendum)
All your tests came back ok. We will see you back in 2 months. If you are having more falls call us back at [phone].  Fall Prevention and Home Safety Falls cause injuries and can affect all age groups. It is possible to prevent falls.  HOW TO PREVENT FALLS  Wear shoes with rubber soles that do not have an opening for your toes.  Keep the inside and outside of your house well lit.  Use night lights throughout your home.  Remove clutter from floors.  Clean up floor spills.  Remove throw rugs or fasten them to the floor with carpet tape.  Do not place electrical cords across pathways.  Put grab bars by your tub, shower, and toilet. Do not use towel bars as grab bars.  Put handrails on both sides of the stairway. Fix loose handrails.  Do not climb on stools or stepladders, if possible.  Do not wax your floors.  Repair uneven or unsafe sidewalks, walkways, or stairs.  Keep items you use a lot within reach.  Be aware of pets.  Keep emergency numbers next to the telephone.  Put smoke detectors in your home and near bedrooms. Ask your doctor what other things you can do to prevent falls. Document Released: 01/12/2009 Document Revised: 09/17/2011 Document Reviewed: 06/18/2011 [facility] Patient Information 2013 [place], [place].

## 2012-05-25 ENCOUNTER — Encounter: Payer: Self-pay | Admitting: Dietician

## 2012-05-25 ENCOUNTER — Other Ambulatory Visit: Payer: Self-pay | Admitting: Internal Medicine

## 2012-05-25 DIAGNOSIS — E785 Hyperlipidemia, unspecified: Secondary | ICD-10-CM

## 2012-05-25 MED ORDER — SIMVASTATIN 40 MG PO TABS: 40.0000 mg | ORAL_TABLET | Freq: Every day | ORAL | Status: DC

## 2012-05-30 ENCOUNTER — Other Ambulatory Visit: Payer: Self-pay | Admitting: Internal Medicine

## 2012-05-30 DIAGNOSIS — I10 Essential (primary) hypertension: Secondary | ICD-10-CM

## 2012-07-27 ENCOUNTER — Other Ambulatory Visit: Payer: Self-pay | Admitting: Internal Medicine

## 2012-07-27 DIAGNOSIS — Z1231 Encounter for screening mammogram for malignant neoplasm of breast: Secondary | ICD-10-CM

## 2012-07-31 ENCOUNTER — Encounter: Payer: Self-pay | Admitting: Internal Medicine

## 2012-07-31 ENCOUNTER — Ambulatory Visit (INDEPENDENT_AMBULATORY_CARE_PROVIDER_SITE_OTHER): Payer: Medicare Other | Admitting: Internal Medicine

## 2012-07-31 VITALS — BP 135/83 | HR 72 | Temp 96.5°F | Ht 62.0 in | Wt 252.0 lb

## 2012-07-31 DIAGNOSIS — I10 Essential (primary) hypertension: Secondary | ICD-10-CM

## 2012-07-31 DIAGNOSIS — M1990 Unspecified osteoarthritis, unspecified site: Secondary | ICD-10-CM

## 2012-07-31 DIAGNOSIS — E119 Type 2 diabetes mellitus without complications: Secondary | ICD-10-CM

## 2012-07-31 DIAGNOSIS — Z6841 Body Mass Index (BMI) 40.0 and over, adult: Secondary | ICD-10-CM

## 2012-07-31 DIAGNOSIS — R350 Frequency of micturition: Secondary | ICD-10-CM

## 2012-07-31 LAB — POCT URINALYSIS DIPSTICK
Ketones, UA: NEGATIVE
Protein, UA: NEGATIVE
Spec Grav, UA: 1.025
Urobilinogen, UA: 1
pH, UA: 5.5

## 2012-07-31 LAB — BASIC METABOLIC PANEL WITH GFR
CO2: 33 mEq/L — ABNORMAL HIGH (ref 19–32)
Calcium: 9.6 mg/dL (ref 8.4–10.5)
Creat: 0.96 mg/dL (ref 0.50–1.10)
GFR, Est African American: 76 mL/min
Glucose, Bld: 210 mg/dL — ABNORMAL HIGH (ref 70–99)
Sodium: 138 mEq/L (ref 135–145)

## 2012-07-31 LAB — POCT GLYCOSYLATED HEMOGLOBIN (HGB A1C): Hemoglobin A1C: 6.4

## 2012-07-31 LAB — GLUCOSE, CAPILLARY: Glucose-Capillary: 174 mg/dL — ABNORMAL HIGH (ref 70–99)

## 2012-07-31 MED ORDER — ACETAMINOPHEN 325 MG PO TABS: 650.0000 mg | ORAL_TABLET | Freq: Four times a day (QID) | ORAL | Status: DC | PRN

## 2012-07-31 NOTE — Assessment & Plan Note (Signed)
Blood pressure is well controlled today. I will continue metoprolol 100 mg twice a day and Accuretic 20/25 mg daily.  BP Readings from Last 3 Encounters:  07/31/12 135/83  05/11/12 140/83  05/04/12 132/89

## 2012-07-31 NOTE — Assessment & Plan Note (Signed)
Patient was likely has significant osteoarthritis in knee  Bilaterally. Patient has been on nonsteroidal for some time without any significant improvement. I discussed with patient different options including steroid injection and referred to orthopedic for possible knee replacement. Patient was reluctant to proceed with any of these options. Patient's weight is 250 pounds which is most likely a big factor contributing to her knee pain. Discussed about weight reduction, physical therapy and exercise in length. Patient would like to try this first before being referred to orthopedics. I discontinued naproxen at this point the patient history of hypertension and diabetes. I was started on Acetaminophen 325 -650 mg q6hr prn for pain

## 2012-07-31 NOTE — Progress Notes (Signed)
Subjective:   Patient ID: [name] female   DOB: Aug 31, 1955 57 y.o.   MRN: [id number]  HPI: [name] is a 57 y.o. female with past history significant as outlined below who presented to the clinic with multiple complains. 1. Urinary frequency : Patient reports that she has been experiencing increased urinary frequency and a questionably burning sensation since few months. Since this has been somewhat stable but is definitely bothersome. She had multiple episodes of accidents. Denies any flank pain, blood in the urine, fevers or chills.    2. Knee pain; patient has chronic knee pain and reports that it has been getting worse. She has been taking naproxen on a daily basis without any significant improvement.    Past Medical History  Diagnosis Date  . Hypertension   . Hyperlipidemia   . Diabetes mellitus   . Hyperplastic colon polyp 12-2007    Dr. Christella Hartigan   Current Outpatient Prescriptions  Medication Sig Dispense Refill  . benzonatate (TESSALON PERLES) 100 MG capsule Take 1 capsule (100 mg total) by mouth every 6 (six) hours as needed for cough.  30 capsule  1  . levofloxacin (LEVAQUIN) 500 MG tablet Take 1 tablet (500 mg total) by mouth daily.  7 tablet  0  . metoprolol (LOPRESSOR) 100 MG tablet TAKE 1 TABLET TWICE DAILY  60 tablet  2  . naproxen (NAPROSYN) 250 MG tablet Take 1 tablet (250 mg total) by mouth 2 (two) times daily with a meal.  60 tablet  0  . omeprazole (PRILOSEC) 20 MG capsule Take 1 capsule (20 mg total) by mouth daily.  30 capsule  0  . quinapril-hydrochlorothiazide (ACCURETIC) 20-25 MG per tablet TAKE 1 TABLET BY MOUTH DAILY  30 tablet  2  . simvastatin (ZOCOR) 40 MG tablet Take 1 tablet (40 mg total) by mouth at bedtime.  30 tablet  2   No current facility-administered medications for this visit.   Family History  Problem Relation Age of Onset  . Stroke Mother     age 16  . Heart attack Father     age 62  . Heart attack Sister     age 28  .  Colon cancer      Has an aunt that died of Colon CA (late 50's-early 51's)   History   Social History  . Marital Status: Married    Spouse Name: N/A    Number of Children: N/A  . Years of Education: N/A   Occupational History  . Unemployed    Social History Main Topics  . Smoking status: Never Smoker   . Smokeless tobacco: None  . Alcohol Use: No  . Drug Use: No  . Sexually Active: None     Comment: with seperated husband   Other Topics Concern  . None   Social History Narrative   Lives at home with son.  Separated from husband- still sexually active with him   Review of Systems: Constitutional: Denies fever, chills, diaphoresis, appetite change and fatigue.  Respiratory: Denies SOB, DOE, cough, chest tightness,  and wheezing.   Cardiovascular: Denies chest pain, palpitations and leg swelling.  Gastrointestinal: Denies nausea, vomiting, abdominal pain, diarrhea, constipation, blood in stool and abdominal distention.   Skin: Denies pallor, rash and wound.  Neurological: Denies dizziness  Objective:  Physical Exam: Filed Vitals:   07/31/12 1009  BP: 135/83  Pulse: 72  Temp: 96.5 F (35.8 C)  TempSrc: Oral  Height: 5\' 2"  (1.575 m)  Weight: 252 lb (114.306 kg)  SpO2: 98%   Constitutional: Vital signs reviewed.  Patient is a  Morbid obese female in no acute distress and cooperative with exam. Alert and oriented x3.  Eyes: PERRL, EOMI, conjunctivae normal, No scleral icterus.  Neck: Supple, .  Cardiovascular: RRR, S1 normal, S2 normal, no MRG, pulses symmetric and intact bilaterally Pulmonary/Chest: CTAB, no wheezes, rales, or rhonchi Abdominal: Soft. Non-tender, non-distended, bowel sounds are normal, no masses, organomegaly, or guarding present.  GU: no CVA tenderness Musculoskeletal: Bilateral knee joints ; tender to combination around the entire joint, decreased range of motion do to pain somewhat locked in feeling, Neurological: A&O x3, Strength is normal and  symmetric bilaterally,  no focal motor deficit, sensory intact to light touch bilaterally.  Skin: Warm, dry and intact. No rash, cyanosis, or clubbing.

## 2012-07-31 NOTE — Assessment & Plan Note (Addendum)
Hemoglobin A1c today is 6.4 on no medication. I will continue to monitor her. Patient had history of diabetes in the past and was taking medication.  I'll obtain a copy and creatinine ratio today Patient has an ophthalmologist but missed 2 appointments. Patient was reminded that she needs to make an appointment as soon as possible.

## 2012-07-31 NOTE — Assessment & Plan Note (Signed)
Concerning for urinary incontinence either urge or stress. A urine dip was obtained which was negative for nitrate and leukocytes. I'll refer patient to urology for further evaluation and management considering the length of her symptoms.

## 2012-08-01 LAB — URINE CULTURE
Colony Count: NO GROWTH
Organism ID, Bacteria: NO GROWTH

## 2012-08-01 LAB — MICROALBUMIN / CREATININE URINE RATIO: Microalb Creat Ratio: 3.4 mg/g (ref 0.0–30.0)

## 2012-08-03 ENCOUNTER — Other Ambulatory Visit: Payer: Self-pay | Admitting: Internal Medicine

## 2012-08-03 DIAGNOSIS — E119 Type 2 diabetes mellitus without complications: Secondary | ICD-10-CM

## 2012-08-03 MED ORDER — ASPIRIN 81 MG PO TABS: 81.0000 mg | ORAL_TABLET | Freq: Every day | ORAL | Status: DC

## 2012-08-13 ENCOUNTER — Ambulatory Visit: Payer: Medicare Other | Attending: Internal Medicine

## 2012-08-13 NOTE — Addendum Note (Signed)
Addended by: Neomia Dear on: 08/13/2012 04:20 PM   Modules accepted: Orders

## 2012-08-25 ENCOUNTER — Ambulatory Visit (HOSPITAL_COMMUNITY)
Admission: RE | Admit: 2012-08-25 | Discharge: 2012-08-25 | Disposition: A | Discharge status: Home / Self Care | Payer: Medicare Other | Source: Ambulatory Visit | Attending: Internal Medicine | Admitting: Internal Medicine

## 2012-08-25 DIAGNOSIS — Z1231 Encounter for screening mammogram for malignant neoplasm of breast: Secondary | ICD-10-CM | POA: Insufficient documentation

## 2012-09-02 ENCOUNTER — Telehealth: Payer: Self-pay | Admitting: Dietician

## 2012-09-02 DIAGNOSIS — E119 Type 2 diabetes mellitus without complications: Secondary | ICD-10-CM

## 2012-09-02 NOTE — Telephone Encounter (Signed)
Not scheduling an eye exam due to cost. Has never been told that diabetes is affecting her eyes. She agreed to have retinal photos here in office. Scheduled for 09/03/12. Request order form physican.

## 2012-09-03 ENCOUNTER — Ambulatory Visit: Payer: Medicare Other | Admitting: Dietician

## 2012-09-03 ENCOUNTER — Other Ambulatory Visit: Payer: Self-pay | Admitting: Internal Medicine

## 2012-09-03 DIAGNOSIS — E119 Type 2 diabetes mellitus without complications: Secondary | ICD-10-CM

## 2012-09-03 NOTE — Telephone Encounter (Signed)
An order was placed.  Almyra Deforest

## 2012-10-08 ENCOUNTER — Other Ambulatory Visit: Payer: Self-pay

## 2012-11-19 ENCOUNTER — Telehealth: Payer: Self-pay | Admitting: Dietician

## 2012-11-19 NOTE — Telephone Encounter (Signed)
Forwarded a message from triage nurse questioning authenticity of message: [facility] calling for RX for 3x a day testing. [phone] or fax # is [phone] Note patient has not brought a meter in in past 12 months and is on no diabetes medicines.  She does not qualify for more than 1x a day testing. I i would not recommend blood glucose testing in this patient a this time with A1C < 6.5% for past 3 years.

## 2012-11-19 NOTE — Telephone Encounter (Signed)
Talked with pt and she states this company called her about test strips.  She does not have AARP so does not qualify.

## 2012-11-23 ENCOUNTER — Ambulatory Visit (INDEPENDENT_AMBULATORY_CARE_PROVIDER_SITE_OTHER): Payer: Medicare Other | Admitting: Internal Medicine

## 2012-11-23 ENCOUNTER — Ambulatory Visit (INDEPENDENT_AMBULATORY_CARE_PROVIDER_SITE_OTHER): Payer: Medicare Other | Admitting: Dietician

## 2012-11-23 ENCOUNTER — Encounter: Payer: Self-pay | Admitting: Internal Medicine

## 2012-11-23 VITALS — BP 144/89 | HR 66 | Temp 97.1°F | Ht 62.0 in | Wt 247.6 lb

## 2012-11-23 DIAGNOSIS — I10 Essential (primary) hypertension: Secondary | ICD-10-CM

## 2012-11-23 DIAGNOSIS — R350 Frequency of micturition: Secondary | ICD-10-CM

## 2012-11-23 DIAGNOSIS — E119 Type 2 diabetes mellitus without complications: Secondary | ICD-10-CM

## 2012-11-23 DIAGNOSIS — E785 Hyperlipidemia, unspecified: Secondary | ICD-10-CM

## 2012-11-23 DIAGNOSIS — M1990 Unspecified osteoarthritis, unspecified site: Secondary | ICD-10-CM

## 2012-11-23 MED ORDER — METOPROLOL TARTRATE 100 MG PO TABS: 100.0000 mg | ORAL_TABLET | Freq: Two times a day (BID) | ORAL | Status: DC

## 2012-11-23 MED ORDER — QUINAPRIL-HYDROCHLOROTHIAZIDE 20-25 MG PO TABS: 1.0000 | ORAL_TABLET | Freq: Every day | ORAL | Status: DC

## 2012-11-23 MED ORDER — SIMVASTATIN 40 MG PO TABS: 40.0000 mg | ORAL_TABLET | Freq: Every day | ORAL | Status: DC

## 2012-11-23 NOTE — Progress Notes (Signed)
Here for annual diabetes eye exam

## 2012-11-23 NOTE — Assessment & Plan Note (Addendum)
Patient is doing well with simvastatin 40mg  daily. Will continue. Last lipid panel 05/2012 showed Total cholesterol 177, LDL 110, TG 80, HDL 51.

## 2012-11-23 NOTE — Patient Instructions (Addendum)
General Instructions: Thank you for your visit. Please follow up with me in 3 months.  Today I refilled your metoprolol, accuretic, and simvastatin. We did not make any changes to your medication regimen. You can take tylenol for your knee pain- 650mg  every 6 hours as needed for pain. Continue taking one baby aspirin once a day.   Treatment Goals:  Goals (1 Years of Data) as of 11/23/12         As of Today 07/31/12 05/11/12 05/04/12 11/27/11     Blood Pressure    . Blood Pressure < 130/80  144/89 135/83 140/83 132/89 133/86     Result Component    . HEMOGLOBIN A1C < 7.0  6.5 6.4  6.3 6.5    . LDL CALC < 100     110       Progress Toward Treatment Goals:  Treatment Goal 11/23/2012  Hemoglobin A1C at goal  Blood pressure at goal    Self Care Goals & Plans:  Self Care Goal 11/23/2012  Manage my medications take my medicines as prescribed; refill my medications on time  Monitor my health keep track of my blood glucose; check my feet daily  Eat healthy foods drink diet soda or water instead of juice or soda; eat foods that are low in salt; eat baked foods instead of fried foods  Be physically active take a walk every day    Home Blood Glucose Monitoring 11/23/2012  Check my blood sugar once a day  When to check my blood sugar before breakfast     Care Management & Community Referrals:  Referral 11/23/2012  Referrals made for care management support diabetes educator

## 2012-11-23 NOTE — Assessment & Plan Note (Signed)
Patient with bilateral knee pain that is well controlled with tylenol. She has been taking 650mg  twice weekly. The pain is worse with going up and down stairs. I instructed her that she can take more of the tylenol up to 650mg  q6h as needed. She feels as though her pain is fairly well controlled today.

## 2012-11-23 NOTE — Assessment & Plan Note (Signed)
Patient was unable to follow up with urology 2/2 financial constraints. Given her history, this does sound like urge incontinence rather than stress or overflow incontinence. Discussed with patient the benefit of scheduling urination to about every hour or every other hour so as to prevent some of her accidents. She agrees that this sounds like a good plan to try prior to using any pharmacological agent.

## 2012-11-23 NOTE — Assessment & Plan Note (Addendum)
Patient with HbA1c below 6.5% for the past three years- over this time frame she has lost 30lbs. Today her A1c was 6.5%. She has not been on diabetic medications for years and I do not feel that patient needs medication at this time.  She had an appointment with Lupita Leash today. She also received a foot exam and retinal images today.   Goals (1 Years of Data) as of 11/23/12         As of Today 07/31/12 05/11/12 05/04/12 11/27/11     Blood Pressure    . Blood Pressure < 130/80  144/89 135/83 140/83 132/89 133/86     Result Component    . HEMOGLOBIN A1C < 7.0  6.5 6.4  6.3 6.5    . LDL CALC < 100     110

## 2012-11-23 NOTE — Progress Notes (Signed)
Patient ID: [name], female   DOB: 21-May-1955, 57 y.o.   MRN: [id number] HPI The patient is a 57 y.o. female with a history of HTN, DM type 2, OA, HL, depression, increased urinary frequency who presents today for a three month check up.  DM- Patient does not take any medications for her diabetes as her HbA1c has been 6.5 or less for over three years. She does monitor her BG once daily before breakfast- usually around 120. She saw Lupita Leash and had retinal imaging in the office right before I saw patient today. She is due for a foot exam.   Urinary Frequency- Patient reports having increased urgency for 10-12 months. Patient reports having quick onset of the urge to urinate, denies leaking with cough, sneeze or other straining. Denies hematuria and dysuria. She had a negative UA and was referred to urology at her last appt here on 07/2012, but was unable to be seen 2/2 financial constraints.   HTN- Patient takes her metoprolol and accuretic as prescribed. BP today is 144/89, which is fairly close to goal.   OA- Patient has baseline pain to bilateral knees that is worse with movement, especially climbing stairs. She had been taking naproxen for this pain, but she was taken off of this medication in May 2014 given she has chronic diseases that may contribute to kidney damage. She was start on tylenol 650mg  q6h prn for pain at her visit in May. She reports having good relief of her pain with tylenol- usually only takes 650mg  twice per week. No recent swelling or gait changes.  ROS: General: no fevers, chills, changes in weight, changes in appetite Skin: no rash HEENT: no blurry vision, hearing changes, sore throat Pulm: no dyspnea, coughing, wheezing CV: no chest pain, palpitations, shortness of breath Abd: no abdominal pain, nausea/vomiting, diarrhea/constipation GU: no dysuria, hematuria Ext: baseline pain to bilateral knees Neuro: no weakness, numbness, or tingling  Filed Vitals:   11/23/12  1522  BP: 144/89  Pulse: 66  Temp: 97.1 F (36.2 C)    Physical Exam General: overweight, alert, cooperative, and in no apparent distress HEENT: pupils equal round and reactive to light, vision grossly intact, oropharynx clear and non-erythematous  Neck: supple, no LAD Lungs: clear to ascultation bilaterally, normal work of respiration, no wheezes, rales, ronchi Heart: regular rate and rhythm, no murmurs, gallops, or rubs Abdomen: overweight, soft, non-tender, non-distended, normal bowel sounds Extremities: 2+ DP pulses bilaterally, trace nonpitting BLE edema Neurologic: alert & oriented X3, cranial nerves II-XII grossly intact, strength grossly intact, sensation intact to light touch  Current Outpatient Prescriptions on File Prior to Visit  Medication Sig Dispense Refill  . acetaminophen (TYLENOL) 325 MG tablet Take 2 tablets (650 mg total) by mouth every 6 (six) hours as needed for pain.      Marland Kitchen aspirin 81 MG tablet Take 1 tablet (81 mg total) by mouth daily.  30 tablet     No current facility-administered medications on file prior to visit.    Assessment/Plan

## 2012-11-23 NOTE — Assessment & Plan Note (Addendum)
Patient currently at BP goal. Will continue current antihypertensive regimen: metoprolol 100mg  BID and Accuretic 20-25 daily.   Goals (1 Years of Data) as of 11/23/12         As of Today 07/31/12 05/11/12 05/04/12 11/27/11     Blood Pressure    . Blood Pressure < 130/80  144/89 135/83 140/83 132/89 133/86     Result Component    . HEMOGLOBIN A1C < 7.0  6.5 6.4  6.3 6.5    . LDL CALC < 100     110

## 2012-11-24 NOTE — Progress Notes (Signed)
I saw and evaluated the patient.  I personally confirmed the key portions of the history and exam documented by Dr. Chikowski and I reviewed pertinent patient test results.  The assessment, diagnosis, and plan were formulated together and I agree with the documentation in the resident's note. 

## 2012-11-28 ENCOUNTER — Telehealth: Payer: Self-pay | Admitting: Internal Medicine

## 2012-11-28 NOTE — Telephone Encounter (Signed)
INTERNAL MEDICINE RESIDENCY PROGRAM After-Hours Telephone Call    Reason for call:   I received a call from [name] on 11/28/2012 at 8:12 AM. Patient states that she has had intermitted headache for past 4 days. She described that her headache was aching pain, 7/10, located on the left sided frontal head, no radiation, lasting 1-2 hours. Reports that she sees glaring lights with her headaches. Denies hallucination, but states that she became confused with these headache episodes. She tried Advil one tablet last night without any improvement. She has not tried any other medications. She thinks that stress made it worse. She admits that she has a lot of stressors in her life. Denies fever, chills, SOB, chest pain, chest pressure, nausea, vomiting, abdominal pain, numbness or tingling. Endorses Chronic generalized weakness. No new weakness. Denies recent illness or cold. Denies sick contact.   Patient has a history of HTN. She reports medical compliance but does not check her BP at home             Pertinent Data:   Last office visit on 11/23/12--BP 144/89    Assessment / Plan / Recommendations:  # headache Unclear etiology. Since I am unable to evaluate her over the phone, I instructed patient that she needs to go to an urgent care or ED for evaluation. She also needs to check her blood pressure and call me back with her blood pressure. Patient understands and agrees with the plan.    As always, pt is advised that if symptoms worsen or new symptoms arise, they should go to an urgent care facility or to to ER for further evaluation.    Dede Query, MD   11/28/2012, 8:12 AM

## 2012-12-10 NOTE — Addendum Note (Signed)
Addended by: Bufford Spikes on: 12/10/2012 10:11 AM   Modules accepted: Orders

## 2013-02-17 ENCOUNTER — Encounter: Payer: Medicare Other | Admitting: Internal Medicine

## 2013-02-17 ENCOUNTER — Encounter: Payer: Self-pay | Admitting: Internal Medicine

## 2013-02-17 NOTE — Addendum Note (Signed)
Addended by: Hassan Buckler on: 02/17/2013 11:00 AM   Modules accepted: Orders

## 2013-04-02 ENCOUNTER — Encounter (HOSPITAL_COMMUNITY): Payer: Self-pay | Admitting: Emergency Medicine

## 2013-04-02 ENCOUNTER — Emergency Department (HOSPITAL_COMMUNITY)
Admission: EM | Admit: 2013-04-02 | Discharge: 2013-04-02 | Disposition: A | Discharge status: Home / Self Care | Payer: Medicare HMO | Attending: Emergency Medicine | Admitting: Emergency Medicine

## 2013-04-02 ENCOUNTER — Emergency Department (HOSPITAL_COMMUNITY): Payer: Medicare HMO

## 2013-04-02 DIAGNOSIS — I10 Essential (primary) hypertension: Secondary | ICD-10-CM | POA: Insufficient documentation

## 2013-04-02 DIAGNOSIS — I472 Ventricular tachycardia, unspecified: Secondary | ICD-10-CM | POA: Insufficient documentation

## 2013-04-02 DIAGNOSIS — E785 Hyperlipidemia, unspecified: Secondary | ICD-10-CM | POA: Insufficient documentation

## 2013-04-02 DIAGNOSIS — IMO0001 Reserved for inherently not codable concepts without codable children: Secondary | ICD-10-CM | POA: Insufficient documentation

## 2013-04-02 DIAGNOSIS — Z860100 Personal history of colon polyps, unspecified: Secondary | ICD-10-CM | POA: Insufficient documentation

## 2013-04-02 DIAGNOSIS — R51 Headache: Secondary | ICD-10-CM | POA: Insufficient documentation

## 2013-04-02 DIAGNOSIS — R42 Dizziness and giddiness: Secondary | ICD-10-CM | POA: Insufficient documentation

## 2013-04-02 DIAGNOSIS — Z7982 Long term (current) use of aspirin: Secondary | ICD-10-CM | POA: Insufficient documentation

## 2013-04-02 DIAGNOSIS — Z79899 Other long term (current) drug therapy: Secondary | ICD-10-CM | POA: Insufficient documentation

## 2013-04-02 DIAGNOSIS — E119 Type 2 diabetes mellitus without complications: Secondary | ICD-10-CM | POA: Insufficient documentation

## 2013-04-02 DIAGNOSIS — J029 Acute pharyngitis, unspecified: Secondary | ICD-10-CM | POA: Insufficient documentation

## 2013-04-02 DIAGNOSIS — I4729 Other ventricular tachycardia: Secondary | ICD-10-CM | POA: Insufficient documentation

## 2013-04-02 DIAGNOSIS — J111 Influenza due to unidentified influenza virus with other respiratory manifestations: Secondary | ICD-10-CM

## 2013-04-02 LAB — CBC WITH DIFFERENTIAL/PLATELET
BASOS PCT: 0 % (ref 0–1)
Basophils Absolute: 0 10*3/uL (ref 0.0–0.1)
Eosinophils Absolute: 0 10*3/uL (ref 0.0–0.7)
Eosinophils Relative: 0 % (ref 0–5)
HEMATOCRIT: 39.6 % (ref 36.0–46.0)
Hemoglobin: 13.7 g/dL (ref 12.0–15.0)
LYMPHS PCT: 9 % — AB (ref 12–46)
Lymphs Abs: 1.3 10*3/uL (ref 0.7–4.0)
MCH: 31 pg (ref 26.0–34.0)
MCHC: 34.6 g/dL (ref 30.0–36.0)
MCV: 89.6 fL (ref 78.0–100.0)
MONO ABS: 0.7 10*3/uL (ref 0.1–1.0)
Monocytes Relative: 5 % (ref 3–12)
NEUTROS ABS: 11.5 10*3/uL — AB (ref 1.7–7.7)
NEUTROS PCT: 85 % — AB (ref 43–77)
Platelets: 251 10*3/uL (ref 150–400)
RBC: 4.42 MIL/uL (ref 3.87–5.11)
RDW: 13.6 % (ref 11.5–15.5)
WBC: 13.5 10*3/uL — AB (ref 4.0–10.5)

## 2013-04-02 LAB — COMPREHENSIVE METABOLIC PANEL
ALT: 23 U/L (ref 0–35)
AST: 31 U/L (ref 0–37)
Albumin: 3.7 g/dL (ref 3.5–5.2)
Alkaline Phosphatase: 53 U/L (ref 39–117)
BUN: 7 mg/dL (ref 6–23)
CALCIUM: 8.9 mg/dL (ref 8.4–10.5)
CO2: 26 meq/L (ref 19–32)
Chloride: 100 mEq/L (ref 96–112)
Creatinine, Ser: 0.79 mg/dL (ref 0.50–1.10)
GFR calc Af Amer: >90 mL/min (ref >90)
Glucose, Bld: 141 mg/dL — ABNORMAL HIGH (ref 70–99)
Potassium: 3.6 mEq/L — ABNORMAL LOW (ref 3.7–5.3)
Sodium: 139 mEq/L (ref 137–147)
TOTAL PROTEIN: 7.4 g/dL (ref 6.0–8.3)
Total Bilirubin: 0.4 mg/dL (ref 0.3–1.2)

## 2013-04-02 MED ORDER — OSELTAMIVIR PHOSPHATE 75 MG PO CAPS
75.0000 mg | ORAL_CAPSULE | Freq: Once | ORAL | Status: AC
  Administered 2013-04-02: 75 mg via ORAL
  Filled 2013-04-02: qty 1

## 2013-04-02 MED ORDER — OSELTAMIVIR PHOSPHATE 75 MG PO CAPS: 75.0000 mg | ORAL_CAPSULE | Freq: Two times a day (BID) | ORAL | Status: DC

## 2013-04-02 MED ORDER — SODIUM CHLORIDE 0.9 % IV SOLN
INTRAVENOUS | Status: DC
  Administered 2013-04-02: 16:00:00 via INTRAVENOUS

## 2013-04-02 MED ORDER — DM-GUAIFENESIN ER 30-600 MG PO TB12: 1.0000 | ORAL_TABLET | Freq: Two times a day (BID) | ORAL | Status: DC

## 2013-04-02 MED ORDER — SODIUM CHLORIDE 0.9 % IV BOLUS (SEPSIS)
1000.0000 mL | Freq: Once | INTRAVENOUS | Status: AC
  Administered 2013-04-02: 1000 mL via INTRAVENOUS

## 2013-04-02 MED ORDER — ONDANSETRON HCL 4 MG/2ML IJ SOLN
4.0000 mg | Freq: Once | INTRAMUSCULAR | Status: AC
  Administered 2013-04-02: 4 mg via INTRAVENOUS
  Filled 2013-04-02: qty 2

## 2013-04-02 MED ORDER — TRAMADOL HCL 50 MG PO TABS: 50.0000 mg | ORAL_TABLET | Freq: Four times a day (QID) | ORAL | Status: DC | PRN

## 2013-04-02 MED ORDER — HYDROMORPHONE HCL PF 1 MG/ML IJ SOLN
1.0000 mg | Freq: Once | INTRAMUSCULAR | Status: AC
  Administered 2013-04-02: 1 mg via INTRAVENOUS
  Filled 2013-04-02: qty 1

## 2013-04-02 MED ORDER — ACETAMINOPHEN 325 MG PO TABS
650.0000 mg | ORAL_TABLET | Freq: Once | ORAL | Status: AC
  Administered 2013-04-02: 650 mg via ORAL
  Filled 2013-04-02: qty 2

## 2013-04-02 NOTE — ED Provider Notes (Signed)
CSN: [id number]     Arrival date & time 04/02/13  1111 History   First MD Initiated Contact with Patient 04/02/13 1200     Chief Complaint  Patient presents with  . Influenza   (Consider location/radiation/quality/duration/timing/severity/associated sxs/prior Treatment) Patient is a 58 y.o. female presenting with flu symptoms. The history is provided by the patient.  Influenza Presenting symptoms: fever, headache, myalgias and sore throat   Presenting symptoms: no cough, no nausea, no shortness of breath and no vomiting   Associated symptoms: nasal congestion    patient well until 2 in the morning when developed flulike symptoms sore throat headache bodyaches fever some dizziness no nausea no vomiting some mild congestion no cough. Patient followed by outpatient clinics. Patient has history of diabetes but is diet controlled. Patient did not have the flu shot. Patient with significant fever here some mild tachycardia. Patient's room air sats are in the 90s.  Past Medical History  Diagnosis Date  . Hypertension   . Hyperlipidemia   . Diabetes mellitus   . Hyperplastic colon polyp 12-2007    Dr. Ardis Hughs   Past Surgical History  Procedure Laterality Date  . 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 Stroke Mother     age 25  . Heart attack Father     age 89  . Heart attack Sister     age 70  . Colon cancer      Has an aunt that died of Colon CA (late 53's-early 60's)   History  Substance Use Topics  . Smoking status: Never Smoker   . Smokeless tobacco: Not on file  . Alcohol Use: No   OB History   Grav Para Term Preterm Abortions TAB SAB Ect Mult Living                 Review of Systems  Constitutional: Positive for fever.  HENT: Positive for congestion and sore throat.   Eyes: Negative for redness.  Respiratory: Negative for cough and shortness of breath.   Cardiovascular: Negative for  chest pain.  Gastrointestinal: Negative for nausea, vomiting and abdominal pain.  Genitourinary: Negative for dysuria.  Musculoskeletal: Positive for myalgias.  Neurological: Positive for dizziness and headaches.  Hematological: Does not bruise/bleed easily.  Psychiatric/Behavioral: Negative for confusion.    Allergies  Review of patient's allergies indicates no known allergies.  Home Medications   Current Outpatient Rx  Name  Route  Sig  Dispense  Refill  . acetaminophen (TYLENOL) 325 MG tablet   Oral   Take 2 tablets (650 mg total) by mouth every 6 (six) hours as needed for pain.         Marland Kitchen aspirin 81 MG tablet   Oral   Take 1 tablet (81 mg total) by mouth daily.   30 tablet      . metoprolol (LOPRESSOR) 100 MG tablet   Oral   Take 1 tablet (100 mg total) by mouth 2 (two) times daily.   60 tablet   2   . quinapril-hydrochlorothiazide (ACCURETIC) 20-25 MG per tablet   Oral   Take 1 tablet by mouth daily.   30 tablet   2   . simvastatin (ZOCOR) 40 MG tablet   Oral   Take 1 tablet (40 mg total) by mouth at bedtime.   30 tablet   2   . dextromethorphan-guaiFENesin (MUCINEX DM) 30-600 MG per 12 hr tablet  Oral   Take 1 tablet by mouth 2 (two) times daily.   14 tablet   1   . oseltamivir (TAMIFLU) 75 MG capsule   Oral   Take 1 capsule (75 mg total) by mouth every 12 (twelve) hours.   10 capsule   0   . traMADol (ULTRAM) 50 MG tablet   Oral   Take 1 tablet (50 mg total) by mouth every 6 (six) hours as needed.   20 tablet   0    BP 111/59  Pulse 108  Temp(Src) 100.3 F (37.9 C) (Oral)  Resp 16  SpO2 97% Physical Exam  Nursing note and vitals reviewed. Constitutional: She is oriented to person, place, and time. She appears well-developed and well-nourished. No distress.  HENT:  Head: Normocephalic and atraumatic.  Mouth/Throat: Oropharynx is clear and moist.  Eyes: Conjunctivae and EOM are normal. Pupils are equal, round, and reactive to light.   Neck: Normal range of motion.  Cardiovascular: Normal rate, regular rhythm and normal heart sounds.   Pulmonary/Chest: Effort normal and breath sounds normal. No respiratory distress.  Abdominal: Soft. Bowel sounds are normal. There is no tenderness.  Musculoskeletal: Normal range of motion.  Neurological: She is alert and oriented to person, place, and time. No cranial nerve deficit. She exhibits normal muscle tone. Coordination normal.  Skin: Skin is warm. No rash noted.    ED Course  Procedures (including critical care time) Labs Review Labs Reviewed  COMPREHENSIVE METABOLIC PANEL - Abnormal; Notable for the following:    Potassium 3.6 (*)    Glucose, Bld 141 (*)    All other components within normal limits  CBC WITH DIFFERENTIAL - Abnormal; Notable for the following:    WBC 13.5 (*)    Neutrophils Relative % 85 (*)    Neutro Abs 11.5 (*)    Lymphocytes Relative 9 (*)    All other components within normal limits   Results for orders placed during the hospital encounter of 04/02/13  COMPREHENSIVE METABOLIC PANEL      Result Value Range   Sodium 139  137 - 147 mEq/L   Potassium 3.6 (*) 3.7 - 5.3 mEq/L   Chloride 100  96 - 112 mEq/L   CO2 26  19 - 32 mEq/L   Glucose, Bld 141 (*) 70 - 99 mg/dL   BUN 7  6 - 23 mg/dL   Creatinine, Ser 0.79  0.50 - 1.10 mg/dL   Calcium 8.9  8.4 - 10.5 mg/dL   Total Protein 7.4  6.0 - 8.3 g/dL   Albumin 3.7  3.5 - 5.2 g/dL   AST 31  0 - 37 U/L   ALT 23  0 - 35 U/L   Alkaline Phosphatase 53  39 - 117 U/L   Total Bilirubin 0.4  0.3 - 1.2 mg/dL   GFR calc non Af Amer >90  >90 mL/min   GFR calc Af Amer >90  >90 mL/min  CBC WITH DIFFERENTIAL      Result Value Range   WBC 13.5 (*) 4.0 - 10.5 K/uL   RBC 4.42  3.87 - 5.11 MIL/uL   Hemoglobin 13.7  12.0 - 15.0 g/dL   HCT 39.6  36.0 - 46.0 %   MCV 89.6  78.0 - 100.0 fL   MCH 31.0  26.0 - 34.0 pg   MCHC 34.6  30.0 - 36.0 g/dL   RDW 13.6  11.5 - 15.5 %   Platelets 251  150 - 400 K/uL  Neutrophils Relative % 85 (*) 43 - 77 %   Neutro Abs 11.5 (*) 1.7 - 7.7 K/uL   Lymphocytes Relative 9 (*) 12 - 46 %   Lymphs Abs 1.3  0.7 - 4.0 K/uL   Monocytes Relative 5  3 - 12 %   Monocytes Absolute 0.7  0.1 - 1.0 K/uL   Eosinophils Relative 0  0 - 5 %   Eosinophils Absolute 0.0  0.0 - 0.7 K/uL   Basophils Relative 0  0 - 1 %   Basophils Absolute 0.0  0.0 - 0.1 K/uL    Imaging Review Dg Chest 2 View  04/02/2013   CLINICAL DATA:  Weakness  EXAM: CHEST  2 VIEW  COMPARISON:  05/04/2012  FINDINGS: Cardiac shadow is stable. There is elevation of the right hemidiaphragm again. No focal infiltrate or sizable effusion is seen. No bony abnormality is noted.  IMPRESSION: No active cardiopulmonary disease.   Electronically Signed   By: Inez Catalina M.D.   On: 04/02/2013 14:21    EKG Interpretation   None       MDM   1. Influenza    Patient originally seen in fast track by mid-level and referred over here for further treatment. Patient is followed by outpatient clinics. Has a history of diabetes is diet controlled no specific meds. Patient with acute onset of flulike symptoms at 2 in the morning mild sore throat headache bodyaches fever some congestion no significant cough some dizziness no vertigo. No nausea no vomiting or diarrhea. Workup here patient improved with Tylenol and fluids. Patient chest x-rays negative for pneumonia. Patient's labs without any sniffing abnormalities other than the leukocytosis. Patient will be treated with Tamiflu precautions increase fluids bed rest and followup with outpatient clinics next week will return for new or worse symptoms. Patient nontoxic no acute distress. Room air sats have been in the low to mid 90s.    Mervin Kung, MD 04/02/13 [phone]

## 2013-04-02 NOTE — ED Notes (Signed)
Pt c/o fever, body aches, sore throat and headache that started around 2am. Pt rates headache and body ache 9/10.

## 2013-04-02 NOTE — ED Notes (Signed)
Pt with fever, sore throat and body aches starting this am

## 2013-04-02 NOTE — ED Notes (Addendum)
Pt stating she feels like she is going to pass out.  Vernie Shanks, [place] notified.  Pt to be upgraded and moved to acute room.  Lilia Pro, Agricultural consultant made aware.  VS reassessed.

## 2013-04-02 NOTE — Discharge Instructions (Signed)
The symptoms are consistent with early flu. Take Tamiflu as directed. Take Tylenol for the fevers. Take the tramadol as needed for bodyaches. Take Mucinex DM as needed for cough and congestion. Return for any newer worse symptoms over the weekend. Make plans to follow up with your regular doctors at the outpatient clinics next week. It will be important to rest and take plenty of fluids

## 2013-04-02 NOTE — ED Notes (Addendum)
Pt discharged home with all belongings, pt alert and ambulatory upon discharge, wheelchair used for comfort, 3 new RX prescribed, pt verbalizes understanding of discharge instructions, pt driven home by spouse

## 2013-04-13 ENCOUNTER — Telehealth: Payer: Self-pay | Admitting: *Deleted

## 2013-04-13 NOTE — Telephone Encounter (Signed)
Pt informed and voices understanding 

## 2013-04-13 NOTE — Telephone Encounter (Signed)
Pt called stating she was seen in ED on 1/2 for influenza. She is feeling better but cough remains. Non-productive.  Keeps her up at night.   She is taking  mucinex without relief.  Also given tamiflu. Which she has completed. Please advise what she can take for the cough.    Pt # [id number]

## 2013-04-13 NOTE — Telephone Encounter (Signed)
Just advise her to try any OTC cough syrup.  Delsym has the highest amount of dextromethorphan.  Follow instructions on bottle.

## 2013-05-03 ENCOUNTER — Ambulatory Visit (INDEPENDENT_AMBULATORY_CARE_PROVIDER_SITE_OTHER): Payer: Medicare HMO | Admitting: Internal Medicine

## 2013-05-03 DIAGNOSIS — E119 Type 2 diabetes mellitus without complications: Secondary | ICD-10-CM

## 2013-05-03 DIAGNOSIS — M1990 Unspecified osteoarthritis, unspecified site: Secondary | ICD-10-CM

## 2013-05-03 DIAGNOSIS — R059 Cough, unspecified: Secondary | ICD-10-CM

## 2013-05-03 DIAGNOSIS — R05 Cough: Secondary | ICD-10-CM

## 2013-05-03 DIAGNOSIS — E785 Hyperlipidemia, unspecified: Secondary | ICD-10-CM

## 2013-05-03 DIAGNOSIS — I10 Essential (primary) hypertension: Secondary | ICD-10-CM

## 2013-05-03 LAB — GLUCOSE, CAPILLARY: Glucose-Capillary: 107 mg/dL — ABNORMAL HIGH (ref 70–99)

## 2013-05-03 LAB — POCT GLYCOSYLATED HEMOGLOBIN (HGB A1C): HEMOGLOBIN A1C: 6

## 2013-05-03 MED ORDER — DM-GUAIFENESIN ER 30-600 MG PO TB12: 1.0000 | ORAL_TABLET | Freq: Two times a day (BID) | ORAL | Status: DC

## 2013-05-03 MED ORDER — CVS BLOOD PRESSURE CUFF MISC: Status: DC

## 2013-05-03 MED ORDER — SIMVASTATIN 40 MG PO TABS: 40.0000 mg | ORAL_TABLET | Freq: Every day | ORAL | Status: DC

## 2013-05-03 MED ORDER — TRAMADOL HCL 50 MG PO TABS: 50.0000 mg | ORAL_TABLET | Freq: Four times a day (QID) | ORAL | Status: DC | PRN

## 2013-05-03 MED ORDER — QUINAPRIL-HYDROCHLOROTHIAZIDE 20-25 MG PO TABS: 1.0000 | ORAL_TABLET | Freq: Every day | ORAL | Status: DC

## 2013-05-03 MED ORDER — METOPROLOL TARTRATE 100 MG PO TABS: 100.0000 mg | ORAL_TABLET | Freq: Two times a day (BID) | ORAL | Status: DC

## 2013-05-03 NOTE — Patient Instructions (Addendum)
Thank you for your visit.   Please stop taking the lopressor and continue taking ONLY the accuretic for your blood pressure control. I prescribed a blood pressure cuff for you to use at home. I also provided you with a blood pressure cuff. Please check your blood pressure a couple of times a week and record it on the log.  I think your cough is from a viral infection. I do not think you need antibiotics at this time. I prescribed robitussin for your cough. Please take this at night to assist with sleeping while you have the cough. If you cough worsens or if you develop shortness of breath, please return to clinic.   I will see you back between 3-6 months.

## 2013-05-03 NOTE — Progress Notes (Signed)
Patient ID: [name], female   DOB: 01-28-1956, 58 y.o.   MRN: [id number] HPI The patient is a 58 y.o. female with a history of HTN, DM type 2, OA, HL, depression who presents today for a three month check up.  Cough-  Patient reports having a nonproductive cough x 3 days. Denies sore throat, ear pain, body aches, shortness of breath, chest pain, F/C. She also endorses having 1 episode of nonbloody watery diarrhea on Thursday however she has had normal bowel movements since. Of note, she was treated with Tamiflu for presumed flu back in the beginning of January 2015 (evaluated in the ED). She was never tested for the flu, but had high fever and body aches along with cough and sore throat at this time. Patient's flu symptoms had completely resolved for a couple of weeks until this past Saturday.  DM- Patient has not been on any diabetic medications for quite some time as her A1c has been under 6.5% for the past 3 years. A1c today is 6.0%. Patient monitors her CBGs at home one to 2 times a day, CBGs run in the low 100s.  HTN- Patient is supposed to be on lopressor 100mg  BID and accuretic 20-25mg  daily. However, patient notes she has been out of her medications since September 2014. I last saw patient in August 2014 at which time I refilled both of these prescriptions. However, she reports that she never picked up the prescriptions that sent into her pharmacy. Blood pressure today is 146/87 which seems to be well controlled off of all antihypertensives. Patient is requesting refills of her medications today.  OA- Patient has bilateral knee pain attributed to osteoarthritis. She reports difficulty climbing stairs, however otherwise her knee pain is fairly well controlled with tramadol prn. She does take the tramadol sparingly. Occasionally with walking she'll experience a "pop" in her knees. However, patient is able to ambulate without issue most of the time.  ROS: General: no fevers, chills, changes in  weight, changes in appetite Skin: no rash HEENT: no blurry vision, hearing changes, sore throat Pulm: no dyspnea, coughing, wheezing CV: no chest pain, palpitations, shortness of breath Abd: no abdominal pain, nausea/vomiting, diarrhea/constipation GU: no dysuria, hematuria, polyuria Ext: no arthralgias, myalgias Neuro: no weakness, numbness, or tingling  VS: BP 146/87 Temp 97.2 HR 77 SpO2 99% on room air  PEX General: alert, cooperative, and in no apparent distress HEENT: pupils equal round and reactive to light, vision grossly intact, oropharynx clear and non-erythematous  Neck: supple, no lymphadenopathy Lungs: clear to ascultation bilaterally, normal work of respiration, no wheezes, rales, ronchi Heart: regular rate and rhythm, no murmurs, gallops, or rubs Abdomen: soft, non-tender, non-distended, normal bowel sounds Extremities: warm, no pedal edema Neurologic: alert & oriented X3, cranial nerves II-XII grossly intact, strength grossly intact, sensation intact to light touch; normal and independent gait  Current Outpatient Prescriptions on File Prior to Visit  Medication Sig Dispense Refill  . acetaminophen (TYLENOL) 325 MG tablet Take 2 tablets (650 mg total) by mouth every 6 (six) hours as needed for pain.      Marland Kitchen aspirin 81 MG tablet Take 1 tablet (81 mg total) by mouth daily.  30 tablet    . oseltamivir (TAMIFLU) 75 MG capsule Take 1 capsule (75 mg total) by mouth every 12 (twelve) hours.  10 capsule  0   No current facility-administered medications on file prior to visit.    Assessment/Plan

## 2013-05-03 NOTE — Assessment & Plan Note (Signed)
Blood pressure remains well controlled at 146/87 today despite the fact the patient has not been taking her medication since September 2014. I do not know if patient's blood pressure is running higher while she is at home, but I am not inclined to start both of her medication she had been on in the past at this time given the BP reading we have today. I will restart Accuretic 20-25mg  daily today. We will continue to hold the Lopressor 100 mg BID at this time as patient does not have CHF or CAD or other comorbidities that would offer an added benefit for beta blocker therapy. I gave patient a blood pressure log and prescribed a blood pressure cuff for her to check her pressures at home which she plans on doing. Based on these readings combined with the reading at her next visit I will make a decision to either continue holding the Lopressor or adding the Lopressor her back on. She can follow up within the next 3-6 months.

## 2013-05-03 NOTE — Assessment & Plan Note (Signed)
Patient has a nonproductive cough for the past 3 days. This likely represents a viral URI. I do not suspect pneumonia at this time given her cough is nonproductive, she is afebrile, and her lungs sound clear. I asked patient to return to clinic if her cough worsens, becomes productive of green sputum, or she develops shortness of breath. I do not think this represents influenza as patient does not have of high fever, body aches and cough seems to be a fairly isolated symptom. I refilled her Mucinex today.

## 2013-05-03 NOTE — Assessment & Plan Note (Addendum)
Patient's A1c is 6.0% today. Her A1c has been well controlled off of all diabetic medications. No need to restart any medications at this time. Patient has been checking CBGs at home approximately twice a day. I let patient know she can decrease the frequency of checking her CBGs if she wishes. Of note, patient did refuse the flu shot today.

## 2013-08-16 ENCOUNTER — Encounter: Payer: Self-pay | Admitting: Internal Medicine

## 2013-08-16 ENCOUNTER — Ambulatory Visit (INDEPENDENT_AMBULATORY_CARE_PROVIDER_SITE_OTHER): Payer: Medicare HMO | Admitting: Internal Medicine

## 2013-08-16 VITALS — BP 127/80 | HR 78 | Temp 97.1°F | Ht 62.0 in | Wt 247.4 lb

## 2013-08-16 DIAGNOSIS — M25532 Pain in left wrist: Secondary | ICD-10-CM

## 2013-08-16 DIAGNOSIS — Z0000 Encounter for general adult medical examination without abnormal findings: Secondary | ICD-10-CM

## 2013-08-16 DIAGNOSIS — M25539 Pain in unspecified wrist: Secondary | ICD-10-CM

## 2013-08-16 DIAGNOSIS — E119 Type 2 diabetes mellitus without complications: Secondary | ICD-10-CM

## 2013-08-16 DIAGNOSIS — M79671 Pain in right foot: Secondary | ICD-10-CM

## 2013-08-16 DIAGNOSIS — I10 Essential (primary) hypertension: Secondary | ICD-10-CM

## 2013-08-16 DIAGNOSIS — M79609 Pain in unspecified limb: Secondary | ICD-10-CM

## 2013-08-16 DIAGNOSIS — E785 Hyperlipidemia, unspecified: Secondary | ICD-10-CM

## 2013-08-16 DIAGNOSIS — M1990 Unspecified osteoarthritis, unspecified site: Secondary | ICD-10-CM

## 2013-08-16 LAB — LIPID PANEL
Cholesterol: 161 mg/dL (ref 0–200)
HDL: 46 mg/dL (ref >39)
LDL Cholesterol: 91 mg/dL (ref 0–99)
Total CHOL/HDL Ratio: 3.5 Ratio
Triglycerides: 119 mg/dL (ref <150)
VLDL: 24 mg/dL (ref 0–40)

## 2013-08-16 LAB — GLUCOSE, CAPILLARY: GLUCOSE-CAPILLARY: 137 mg/dL — AB (ref 70–99)

## 2013-08-16 LAB — POCT GLYCOSYLATED HEMOGLOBIN (HGB A1C): HEMOGLOBIN A1C: 6.5

## 2013-08-16 MED ORDER — IBUPROFEN 200 MG PO TABS: 400.0000 mg | ORAL_TABLET | Freq: Four times a day (QID) | ORAL | Status: DC | PRN

## 2013-08-16 NOTE — Progress Notes (Signed)
Patient ID: [name], female   DOB: 05-27-1955, 58 y.o.   MRN: [id number] HPI The patient is a 57 y.o. female with a history of HTN, DM type 2, OA, HL, depression who presents for a routine clinic visit.  DM type 2, diet controlled: Patient w/ diet controlled DM type 2. Last A1c 6.0 05/2013. No medications. Patient still checking blood sugar a couple times per day, she knows she can cut back if she wants to. She did not bring her meter today.  HTN: Patient with well controlled BP, today BP is 127/80. She is on accuretic 20-25mg  daily. Had been on this medication as well as lopressor as of earlier this year. However, she ran out of all medications at her last office visit and her BP was fairly well controlled off of all medications. Therefore, at that time we had only continued the accuretic. Seems to be doing very well on this medication, no SE complaints today.   OA, bilateral knees: Patient with xray proven OA to L knee and I presume she also has OA in R knee. Symptoms stable. Still describing occasionally "popping" and "giving out" to b/l knees. She takes tylenol 650mg  approx once per day. Had been given tramadol and will occasionally take this medication, though it does not seem to help much. She thinks she has been seen by sports medicine in the past and received an injection of some kind.   L wrist pain: Patient c/o medial dull wrist pain that only occurs with supination and is worse with holding something in her hand. She feels as though sometimes she has to drop items that she is holding because the pain is unbearable. No redness/swelling/skin breakdown to her hand or wrist. No finger, neck, other arm pain.  R heel pain: Patient also c/o having worsening aching to her posterior R foot starting at her heel and radiating to mid foot x 2 weeks. No numbness, tingling, or shooting sensation. This only occurs with standing or walking. It is not worse in the morning nor does it seem to improve  throughout the day. Nothing seems to make this pain better or worse. No redness, swelling. No similar prior episodes.  ROS: General: no fevers, chills Skin: no rash HEENT: no blurry vision, sore throat Pulm: no dyspnea, coughing CV: no chest pain, shortness of breath Abd: no abdominal pain, nausea/vomiting, diarrhea GU: no dysuria Ext: see HPI Neuro: no weakness, numbness, or tingling  Filed Vitals:   08/16/13 1503  BP: 127/80  Pulse: 78  Temp: 97.1 F (36.2 C)   Physical Exam General: alert, cooperative, and in no apparent distress HEENT: pupils equal round and reactive to light, vision grossly intact, oropharynx clear and non-erythematous  Neck: supple Lungs: CTAB, normal WOB Heart: RRR Abdomen: soft, non-tender, non-distended, normal bowel sounds Extremities: warm well perfused, no pedal edema; R foot- no TTP to right heel, no erythema or swelling, no erosion or ulceration or other overlying skin change, unable to reproduce symptoms with movement or palpation, normal AROM; L wrist- pain with supination with some TTP only when pt is supinated, otherwise no bony focal TTP, normal radial pulse and normal grip strength when compared with R hand, negative Tinel and Phalen signs; Bilateral knees- crepitus noted with movement, pain with flexion, negative anterior/posterior drawer signs Neurologic: alert & oriented X3, cranial nerves II-XII grossly intact, strength grossly intact, sensation intact to light touch  Current Outpatient Prescriptions on File Prior to Visit  Medication Sig Dispense Refill  .  acetaminophen (TYLENOL) 325 MG tablet Take 2 tablets (650 mg total) by mouth every 6 (six) hours as needed for pain.      Marland Kitchen aspirin 81 MG tablet Take 1 tablet (81 mg total) by mouth daily.  30 tablet    . Blood Pressure Monitoring ([employer] BLOOD PRESSURE CUFF) MISC 1 blood pressure cuff  1 each  0  . quinapril-hydrochlorothiazide (ACCURETIC) 20-25 MG per tablet Take 1 tablet by mouth daily.   30 tablet  2  . simvastatin (ZOCOR) 40 MG tablet Take 1 tablet (40 mg total) by mouth at bedtime.  30 tablet  2  . traMADol (ULTRAM) 50 MG tablet Take 1 tablet (50 mg total) by mouth every 6 (six) hours as needed.  20 tablet  0   No current facility-administered medications on file prior to visit.    Assessment/Plan

## 2013-08-16 NOTE — Patient Instructions (Signed)
Thank you for your visit!  Today I submitted a referral to sports medicine. Someone will contact you with this appointment.  Please take ibuprofen 400mg  every 6 hours as needed for you joint pain.  Please come to our pap smear clinic on June 8th at 6:30pm as you are overdue for your pap smear.  Today we checked your lipid panel and an A1c. Your A1c was 6.5% today.  Return to clinic in 6 months or as needed.

## 2013-08-17 NOTE — Assessment & Plan Note (Addendum)
Only occurs with supination and is worsened with lifting heavy items. Perhaps a tendonitis to medial wrist vs OA. There is no erythema, edema or other indicators of inflammation which makes me less suspicious for inflammatory process such as RA, gout. Trial of NSAIDs and referral to sports medicine as stated in prior section of this note.

## 2013-08-17 NOTE — Assessment & Plan Note (Signed)
Pt on no medications. HbA1c has deteriorated slightly from 6.0 in 05/2013 to 6.5% today. Will need to continue monitoring A1c, but no need for any changes to management today. Encouraged increased exercise and healthy eating. Exercise is limited 2/2 pain to multiple joints especially knees, so will need to work on pain control before we can expect much improvement in the exercise category.

## 2013-08-17 NOTE — Assessment & Plan Note (Signed)
Scheduled for PAP clinic on June 8th at 6:30pm. Pt informed of this appointment and verbalized that she is able to attend.

## 2013-08-17 NOTE — Assessment & Plan Note (Signed)
BP Readings from Last 3 Encounters:  08/16/13 127/80  04/02/13 120/64  11/23/12 144/89    Lab Results  Component Value Date   NA 139 04/02/2013   K 3.6* 04/02/2013   CREATININE 0.79 04/02/2013    Assessment: Blood pressure control:  good Progress toward BP goal:   at goal Comments:   Plan: Medications:  continue current medications Educational resources provided:   Self management tools provided:   Other plans: continue accuretic 20-25mg  daily; no need to add back lopressor at this time and pt with no other comorbidities requiring a beta blocker

## 2013-08-17 NOTE — Assessment & Plan Note (Signed)
Stable. Pt can continue taking tylenol and start staggering this with ibuprofen 400mg  q6-8h prn.

## 2013-08-17 NOTE — Progress Notes (Signed)
INTERNAL MEDICINE TEACHING ATTENDING ADDENDUM - Ruari Duggan, MD: I reviewed and discussed with the resident Dr. Chikowski, the patient's medical history, physical examination, diagnosis and results of pertinent tests and treatment and I agree with the patient's care as documented. 

## 2013-08-17 NOTE — Assessment & Plan Note (Signed)
Lipid panel done today.  Result 08/17/2013- LDL 91.  Plan is to continue zocor 40mg  daily.

## 2013-08-17 NOTE — Assessment & Plan Note (Signed)
Pt with 2 week hx of worsening aching foot pain located to posterior heel and radiates toward midfoot. Most likely represents plantar fasciitis. Will give a two week trial of ibuprofen 400mg  q6-8h scheduled, then change to prn. Will also refer to sports medicine for her multitude of MSK complaints today.  ADDENDUM: 08/17/13 I received a note that patient's insurance card is out of date (wrong PCP) so her sports medicine referral was canceled. Patient will need to be re-referred to sports medicine once this problem has been remedied.

## 2013-09-09 ENCOUNTER — Other Ambulatory Visit: Payer: Self-pay | Admitting: Internal Medicine

## 2013-10-26 ENCOUNTER — Telehealth: Payer: Self-pay | Admitting: Internal Medicine

## 2013-10-26 ENCOUNTER — Encounter: Payer: Self-pay | Admitting: Internal Medicine

## 2013-10-26 ENCOUNTER — Other Ambulatory Visit: Payer: Self-pay | Admitting: Internal Medicine

## 2013-10-26 NOTE — Telephone Encounter (Signed)
I received documentation from [facility] today suggesting she be on a 90-day supply of Accuritec. I will consider prescribing that at our next appointment.

## 2013-10-26 NOTE — Telephone Encounter (Signed)
I called the patient today to introduce myself and to schedule a follow-up appointment for her pap smear, and she agreed.

## 2013-11-20 ENCOUNTER — Encounter: Payer: Self-pay | Admitting: Gastroenterology

## 2013-12-17 ENCOUNTER — Ambulatory Visit (INDEPENDENT_AMBULATORY_CARE_PROVIDER_SITE_OTHER): Payer: Medicare HMO | Admitting: Internal Medicine

## 2013-12-17 ENCOUNTER — Encounter: Payer: Self-pay | Admitting: Internal Medicine

## 2013-12-17 VITALS — BP 132/77 | HR 74 | Temp 97.6°F | Ht 62.0 in | Wt 247.7 lb

## 2013-12-17 DIAGNOSIS — Z0000 Encounter for general adult medical examination without abnormal findings: Secondary | ICD-10-CM

## 2013-12-17 DIAGNOSIS — J028 Acute pharyngitis due to other specified organisms: Principal | ICD-10-CM

## 2013-12-17 DIAGNOSIS — F3289 Other specified depressive episodes: Secondary | ICD-10-CM

## 2013-12-17 DIAGNOSIS — I10 Essential (primary) hypertension: Secondary | ICD-10-CM

## 2013-12-17 DIAGNOSIS — F329 Major depressive disorder, single episode, unspecified: Secondary | ICD-10-CM

## 2013-12-17 DIAGNOSIS — E119 Type 2 diabetes mellitus without complications: Secondary | ICD-10-CM

## 2013-12-17 DIAGNOSIS — B9789 Other viral agents as the cause of diseases classified elsewhere: Principal | ICD-10-CM

## 2013-12-17 DIAGNOSIS — R413 Other amnesia: Secondary | ICD-10-CM

## 2013-12-17 DIAGNOSIS — J029 Acute pharyngitis, unspecified: Secondary | ICD-10-CM

## 2013-12-17 DIAGNOSIS — M1990 Unspecified osteoarthritis, unspecified site: Secondary | ICD-10-CM

## 2013-12-17 LAB — POCT GLYCOSYLATED HEMOGLOBIN (HGB A1C): HEMOGLOBIN A1C: 6.5

## 2013-12-17 LAB — GLUCOSE, CAPILLARY: GLUCOSE-CAPILLARY: 149 mg/dL — AB (ref 70–99)

## 2013-12-17 NOTE — Patient Instructions (Addendum)
For exercise, try enrolling in a water aerobics class.   For sore throat, try gargling salt water and saline nasal spray.  I look forward to seeing you back in December!  General Instructions:   Please bring your medicines with you each time you come to clinic.  Medicines may include prescription medications, over-the-counter medications, herbal remedies, eye drops, vitamins, or other pills.   Progress Toward Treatment Goals:  Treatment Goal 11/23/2012  Hemoglobin A1C at goal  Blood pressure at goal    Self Care Goals & Plans:  Self Care Goal 08/16/2013  Manage my medications take my medicines as prescribed; bring my medications to every visit; refill my medications on time  Monitor my health keep track of my blood glucose; bring my glucose meter and log to each visit  Eat healthy foods eat more vegetables; eat foods that are low in salt; eat baked foods instead of fried foods  Be physically active take a walk every day; find an activity I enjoy    Home Blood Glucose Monitoring 11/23/2012  Check my blood sugar once a day  When to check my blood sugar before breakfast     Care Management & Community Referrals:  Referral 11/23/2012  Referrals made for care management support diabetes educator

## 2013-12-17 NOTE — Progress Notes (Signed)
   Subjective:    Patient ID: [name], female    DOB: 06-22-1955, 58 y.o.   MRN: [id number]  HPI Ms. Logue is a 58 year old female with history of HTN, DM type 2, OA, HLD, depression who presents for a routine clinic visit  Please see assessment & plan for documentation of her problems.  Review of Systems  Constitutional: Negative for fever and appetite change.  Eyes: Positive for visual disturbance.  Respiratory: Negative for shortness of breath.   Cardiovascular: Negative for chest pain and leg swelling.  Gastrointestinal: Negative for abdominal pain and diarrhea.  Genitourinary: Negative for vaginal bleeding.       Objective:   Physical Exam Constitutional: She is oriented to person, place, and time. She appears well-developed and well-nourished. No distress.  HENT:  Head: Normocephalic and atraumatic.  Eyes: Conjunctivae are normal. Pupils are equal, round, and reactive to light.  Cardiovascular: Normal rate, regular rhythm and normal heart sounds.  Exam reveals no gallop and no friction rub.   No murmur heard. Pulmonary/Chest: Effort normal. No respiratory distress. She has no wheezes. She has no rales.  Abdominal: Soft. Bowel sounds are normal. She exhibits no distension. There is no tenderness.  Neurological: She is alert and oriented to person, place, and time. No cranial nerve deficit. Coordination normal.  MSK: Crepitus elicited with knee flexion/extension bilaterally.  Skin: Skin is warm and dry. She is not diaphoretic.  Psychiatric: Her behavior is normal.       Assessment & Plan:

## 2013-12-20 NOTE — Assessment & Plan Note (Addendum)
Lab Results  Component Value Date   HGBA1C 6.5 12/17/2013   HGBA1C 6.5 08/16/2013   HGBA1C 6.0 05/03/2013     Assessment: Diabetes control: good control (HgbA1C at goal) Progress toward A1C goal:  unchanged Comments: Not currently on medication and wished to continue conservative management at that time. Exercise is limited by the pain she feels in multiple joints. CBG 159 today and she notes eating a pepper mint candy as well a cracker.    Plan: Medications:  none Home glucose monitoring: Frequency: no home glucose monitoring Timing:   Instruction/counseling given: reminded to get eye exam Educational resources provided:   Self management tools provided:   Other plans:  -Referred her for eye exam -Counseled her on better nutrition and as much exercise as she can tolerate  ADDENDUM 01/16/2014 6:14 PM:  -Repeat A1c 6.5 which is consistent with prior value back 4 months ago.  -Will address with patient at follow-up appointment in December

## 2013-12-20 NOTE — Assessment & Plan Note (Signed)
BP Readings from Last 3 Encounters:  12/17/13 132/77  08/16/13 127/80  04/02/13 120/64    Lab Results  Component Value Date   NA 139 04/02/2013   K 3.6* 04/02/2013   CREATININE 0.79 04/02/2013    Assessment: Blood pressure control: controlled Progress toward BP goal:  at goal Comments: Stable.  Plan: Medications:  Accuretic 20-25mg   Educational resources provided:   Estate manager/land agent provided:   Other plans: Continue current medications.

## 2013-12-20 NOTE — Assessment & Plan Note (Signed)
On Index, she scored 21/30 and lost points for executive function (0/1), fluency (1/2; couldn't name more than 8 fruits in 1 minute); abstraction (couldn't name categories for two items, like orange & banana with fruit); delayed recall (3/5); naming (3/4; didn't recognize peacock); visuoperception (2/3).   Assessment -Unsure of etiology at this point but deficits appear more than mere normal aging -Less likely depression given low severity of symptoms -Prior workup was unremarkable for B12 deficiency, hypothyroidism, syphilis   Plan -Continue assessing at each visit and consider rechecking labs should her symptoms persist or worsen

## 2013-12-20 NOTE — Assessment & Plan Note (Addendum)
She does note depressed mood. She wakes up every morning 3-5am and can't go back to sleep; she sleeps 6 hours but feels rested. She does feel low energy though feels like it's better this week. She feels agitated and tries to avoid her husband when she gets into conflicts with him, ever since he turned 66. She denies guiltiness, loss of interest, or SI. She does not feel like she struggles with remembering.   Assessment -She only notes two symptoms at this point which does not warrant SSRI therapy  Plan -Continue assessing at each visit

## 2013-12-20 NOTE — Assessment & Plan Note (Signed)
Sore throat, cough, runny nose since last night. She denies any fevers. She feels she might have come into contact with someone sick at church which she attends Sunday and Wednesday. She denies diarrhea, abdominal pain. She has taken ibuprofen x 2 and Tylenol this morning.   Assessment -Likely viral in nature given acute time course  -Bacterial less likely given duration of symptoms  Plan -Advised symptomatic management and return should symptoms persist or worsen

## 2013-12-20 NOTE — Assessment & Plan Note (Signed)
She was referred to Sports Medicine at her last visit but could not follow through since the insurance card listed the wrong PCP. Tramadol didn't help but takes ibuprofen 200mg -400mg  q8h prn for pain without going more than 600mg  in a day. Pain is worse with knee flexion, sitting and standing. She tries to walk to the store and back (~1.5 mile) at least 3 times a week. Pain picks back up if she stops at any point with walking.    Assessment -Stable from last visit though tolerating exertion in spite of pain  Plan -Reassess at next visit

## 2013-12-20 NOTE — Assessment & Plan Note (Signed)
Pap smear: She was due to get it in June but could not due to transportation issues. She is interested in getting it though cannot today due to her ride and defers to another appointment.  Assessment -No signs of symptoms of malignancy; stable  Plan -Collect pap smear at next visit

## 2013-12-21 ENCOUNTER — Telehealth: Payer: Self-pay | Admitting: Dietician

## 2013-12-21 NOTE — Progress Notes (Signed)
INTERNAL MEDICINE TEACHING ATTENDING ADDENDUM - Aldine Contes, MD: I personally saw and evaluated Ms. Kommer in this clinic visit in conjunction with the resident, Dr. Posey Pronto. I have discussed patient's plan of care with medical resident during this visit. I have confirmed the physical exam findings and have read and agree with the clinic note including the plan with the following addition: - Pt here for routine f/u - Pt with likely viral pharyngitis. C/w symptomatic management - BP is at goal. Will c/w current meds - Pt with mild depression. Will monitor

## 2013-12-22 ENCOUNTER — Other Ambulatory Visit: Payer: Self-pay | Admitting: Dietician

## 2013-12-22 ENCOUNTER — Ambulatory Visit: Payer: Medicare HMO | Admitting: Dietician

## 2013-12-22 DIAGNOSIS — E119 Type 2 diabetes mellitus without complications: Secondary | ICD-10-CM

## 2013-12-22 LAB — HM DIABETES EYE EXAM

## 2013-12-22 NOTE — Telephone Encounter (Signed)
Appointment for eye exam scheduled for 12/22/13

## 2013-12-22 NOTE — Progress Notes (Signed)
Patient had retinal images taken and transmitted. She asked about seeing things cross her field of vision occasionally that looked like lines and moved when she moved her eye. Gave her pamphlets on symptoms of diabetic retinopathy and macular edema. She denied these symptoms. Deferred her question to her next visit with her doctor.

## 2013-12-22 NOTE — Addendum Note (Signed)
Addended by: Resa Miner on: 12/22/2013 01:58 PM   Modules accepted: Orders

## 2013-12-27 ENCOUNTER — Encounter: Payer: Self-pay | Admitting: *Deleted

## 2013-12-31 ENCOUNTER — Encounter: Payer: Self-pay | Admitting: Internal Medicine

## 2013-12-31 NOTE — Progress Notes (Signed)
Patient ID: [name], female   DOB: Jul 25, 1955, 58 y.o.   MRN: [id number]  I am reviewing clinical documentation today from our clinic received 12/24/13:    Retinal scans were unremarkable for diabetic retinopathy.

## 2014-01-07 ENCOUNTER — Encounter: Payer: Self-pay | Admitting: *Deleted

## 2014-01-26 ENCOUNTER — Encounter: Payer: Self-pay | Admitting: Internal Medicine

## 2014-01-26 NOTE — Progress Notes (Signed)
Patient ID: [name], female   DOB: 10-20-1955, 58 y.o.   MRN: [id number]  I am completing paperwork today from [place] that requires my signature:  Received 01/19/14: authorization of diabetic testing supplies. As she has two readings of A1c 6.5, I do not think she needs insulin at this moment but could benefit from once-a-day CBG monitoring and lifestyle modifications.

## 2014-03-03 ENCOUNTER — Inpatient Hospital Stay (HOSPITAL_COMMUNITY)
Admission: EM | Admit: 2014-03-03 | Discharge: 2014-03-05 | DRG: 069 | Disposition: A | Discharge status: Home Health | Payer: Medicare HMO | Attending: Internal Medicine | Admitting: Internal Medicine

## 2014-03-03 ENCOUNTER — Emergency Department (HOSPITAL_COMMUNITY): Payer: Medicare HMO

## 2014-03-03 ENCOUNTER — Encounter (HOSPITAL_COMMUNITY): Payer: Self-pay | Admitting: *Deleted

## 2014-03-03 DIAGNOSIS — E785 Hyperlipidemia, unspecified: Secondary | ICD-10-CM | POA: Diagnosis present

## 2014-03-03 DIAGNOSIS — Z79899 Other long term (current) drug therapy: Secondary | ICD-10-CM | POA: Diagnosis not present

## 2014-03-03 DIAGNOSIS — I10 Essential (primary) hypertension: Secondary | ICD-10-CM | POA: Diagnosis present

## 2014-03-03 DIAGNOSIS — Z7982 Long term (current) use of aspirin: Secondary | ICD-10-CM | POA: Diagnosis not present

## 2014-03-03 DIAGNOSIS — Z823 Family history of stroke: Secondary | ICD-10-CM

## 2014-03-03 DIAGNOSIS — H538 Other visual disturbances: Secondary | ICD-10-CM

## 2014-03-03 DIAGNOSIS — Z6841 Body Mass Index (BMI) 40.0 and over, adult: Secondary | ICD-10-CM | POA: Diagnosis not present

## 2014-03-03 DIAGNOSIS — G459 Transient cerebral ischemic attack, unspecified: Secondary | ICD-10-CM | POA: Diagnosis present

## 2014-03-03 DIAGNOSIS — G450 Vertebro-basilar artery syndrome: Secondary | ICD-10-CM

## 2014-03-03 DIAGNOSIS — E669 Obesity, unspecified: Secondary | ICD-10-CM | POA: Diagnosis present

## 2014-03-03 DIAGNOSIS — E119 Type 2 diabetes mellitus without complications: Secondary | ICD-10-CM

## 2014-03-03 DIAGNOSIS — R42 Dizziness and giddiness: Secondary | ICD-10-CM

## 2014-03-03 DIAGNOSIS — R200 Anesthesia of skin: Secondary | ICD-10-CM | POA: Diagnosis not present

## 2014-03-03 DIAGNOSIS — Z8673 Personal history of transient ischemic attack (TIA), and cerebral infarction without residual deficits: Secondary | ICD-10-CM | POA: Diagnosis present

## 2014-03-03 LAB — COMPREHENSIVE METABOLIC PANEL
ALK PHOS: 42 U/L (ref 39–117)
ALT: 27 U/L (ref 0–35)
AST: 34 U/L (ref 0–37)
Albumin: 4 g/dL (ref 3.5–5.2)
Anion gap: 14 (ref 5–15)
BUN: 9 mg/dL (ref 6–23)
CALCIUM: 9.3 mg/dL (ref 8.4–10.5)
CO2: 26 meq/L (ref 19–32)
Chloride: 100 mEq/L (ref 96–112)
Creatinine, Ser: 0.73 mg/dL (ref 0.50–1.10)
Glucose, Bld: 133 mg/dL — ABNORMAL HIGH (ref 70–99)
POTASSIUM: 3.8 meq/L (ref 3.7–5.3)
SODIUM: 140 meq/L (ref 137–147)
TOTAL PROTEIN: 7.2 g/dL (ref 6.0–8.3)
Total Bilirubin: 0.4 mg/dL (ref 0.3–1.2)

## 2014-03-03 LAB — CBC WITH DIFFERENTIAL/PLATELET
BASOS ABS: 0.1 10*3/uL (ref 0.0–0.1)
Basophils Relative: 1 % (ref 0–1)
EOS PCT: 1 % (ref 0–5)
Eosinophils Absolute: 0.1 10*3/uL (ref 0.0–0.7)
HCT: 39.8 % (ref 36.0–46.0)
Hemoglobin: 13.6 g/dL (ref 12.0–15.0)
Lymphocytes Relative: 38 % (ref 12–46)
Lymphs Abs: 3.3 10*3/uL (ref 0.7–4.0)
MCH: 31.8 pg (ref 26.0–34.0)
MCHC: 34.2 g/dL (ref 30.0–36.0)
MCV: 93 fL (ref 78.0–100.0)
Monocytes Absolute: 0.4 10*3/uL (ref 0.1–1.0)
Monocytes Relative: 5 % (ref 3–12)
Neutro Abs: 4.9 10*3/uL (ref 1.7–7.7)
Neutrophils Relative %: 55 % (ref 43–77)
PLATELETS: 273 10*3/uL (ref 150–400)
RBC: 4.28 MIL/uL (ref 3.87–5.11)
RDW: 13.6 % (ref 11.5–15.5)
WBC: 8.8 10*3/uL (ref 4.0–10.5)

## 2014-03-03 LAB — KETONES, QUALITATIVE: Acetone, Bld: NEGATIVE

## 2014-03-03 LAB — CBG MONITORING, ED: Glucose-Capillary: 131 mg/dL — ABNORMAL HIGH (ref 70–99)

## 2014-03-03 LAB — URINALYSIS, ROUTINE W REFLEX MICROSCOPIC
Bilirubin Urine: NEGATIVE
Glucose, UA: NEGATIVE mg/dL
Hgb urine dipstick: NEGATIVE
Ketones, ur: NEGATIVE mg/dL
Nitrite: NEGATIVE
Protein, ur: NEGATIVE mg/dL
SPECIFIC GRAVITY, URINE: 1.021 (ref 1.005–1.030)
UROBILINOGEN UA: 0.2 mg/dL (ref 0.0–1.0)
pH: 5 (ref 5.0–8.0)

## 2014-03-03 LAB — TROPONIN I: Troponin I: <0.3 ng/mL (ref <0.30)

## 2014-03-03 LAB — GLUCOSE, CAPILLARY: Glucose-Capillary: 117 mg/dL — ABNORMAL HIGH (ref 70–99)

## 2014-03-03 LAB — URINE MICROSCOPIC-ADD ON

## 2014-03-03 MED ORDER — IOHEXOL 350 MG/ML SOLN
80.0000 mL | Freq: Once | INTRAVENOUS | Status: AC | PRN
  Administered 2014-03-03: 80 mL via INTRAVENOUS

## 2014-03-03 MED ORDER — ASPIRIN EC 81 MG PO TBEC
81.0000 mg | DELAYED_RELEASE_TABLET | Freq: Every day | ORAL | Status: DC
  Administered 2014-03-04 – 2014-03-05 (×2): 81 mg via ORAL
  Filled 2014-03-03 (×2): qty 1

## 2014-03-03 MED ORDER — HEPARIN SODIUM (PORCINE) 5000 UNIT/ML IJ SOLN
5000.0000 [IU] | Freq: Three times a day (TID) | INTRAMUSCULAR | Status: DC
  Administered 2014-03-03 – 2014-03-05 (×5): 5000 [IU] via SUBCUTANEOUS
  Filled 2014-03-03 (×5): qty 1

## 2014-03-03 MED ORDER — SODIUM CHLORIDE 0.9 % IV BOLUS (SEPSIS)
1000.0000 mL | Freq: Once | INTRAVENOUS | Status: AC
  Administered 2014-03-03: 1000 mL via INTRAVENOUS

## 2014-03-03 MED ORDER — INFLUENZA VAC SPLIT QUAD 0.5 ML IM SUSY
0.5000 mL | PREFILLED_SYRINGE | INTRAMUSCULAR | Status: AC
  Administered 2014-03-04: 0.5 mL via INTRAMUSCULAR
  Filled 2014-03-03: qty 0.5

## 2014-03-03 MED ORDER — MECLIZINE HCL 25 MG PO TABS
25.0000 mg | ORAL_TABLET | Freq: Once | ORAL | Status: AC
  Administered 2014-03-03: 25 mg via ORAL
  Filled 2014-03-03: qty 1

## 2014-03-03 MED ORDER — SIMVASTATIN 40 MG PO TABS
40.0000 mg | ORAL_TABLET | Freq: Every day | ORAL | Status: DC
  Administered 2014-03-03: 40 mg via ORAL
  Filled 2014-03-03: qty 1

## 2014-03-03 MED ORDER — INSULIN ASPART 100 UNIT/ML ~~LOC~~ SOLN: 0.0000 [IU] | Freq: Three times a day (TID) | SUBCUTANEOUS | Status: DC

## 2014-03-03 MED ORDER — ASPIRIN EC 81 MG PO TBEC: 81.0000 mg | DELAYED_RELEASE_TABLET | Freq: Every day | ORAL | Status: DC

## 2014-03-03 MED ORDER — SODIUM CHLORIDE 0.9 % IV BOLUS (SEPSIS)
500.0000 mL | Freq: Once | INTRAVENOUS | Status: AC
  Administered 2014-03-03: 500 mL via INTRAVENOUS

## 2014-03-03 NOTE — H&P (Signed)
Date: 03/03/2014               Patient Name:  [name] MRN: [id number]  DOB: 04/18/1955 Age / Sex: 58 y.o., female   PCP: Charlott Rakes, MD         Medical Service: Internal Medicine Teaching Service         Attending Physician: Dr. Madilyn Fireman, MD    First Contact: Dr. Randell Patient Pager: [phone]  Second Contact: Dr. Redmond Pulling Pager: [phone]       After Hours (After 5p/  First Contact Pager: [phone]  weekends / holidays): Second Contact Pager: [phone]   Chief Complaint: Dizziness  History of Present Illness: Ms. [name] is a 58 year old woman with history of HTN, HLD, DM2 presenting with vertigo, vision changes, HA, N/V since 3:30AM. She reports she was last normal prior to falling asleep this morning. She woke up this morning with the symptoms. She describes her dizziness as the room spinning. Her vision changes included fading and small images. No double vision. She also reports slurred speech and trouble with word finding this morning. Her headache was sharp located on the left and posteriorly. It was constant. She has had headaches similar to this before. No previous episodes of the other symptoms. She had nausea and two episodes of emesis, nonbloody, as a consequence of the vertigo. Lying down made her feel better. No relieving or exacerbating factors otherwise.   She reports R thumb paresthesias. Denies fevers, chills, cough, shortness of breath, chest pain, palpitations, abdominal pain, diarrhea, hematochezia, melena, dysuria, hematuria, rash, bleed/bruise/VTE hx, weakness. Occasional LE edema without acute changes. She reports her symptoms have improved presently. Family history of stroke in mother.  Meds: No current facility-administered medications for this encounter.   Current Outpatient Prescriptions  Medication Sig Dispense Refill  . aspirin 81 MG tablet Take 1 tablet (81 mg total) by mouth daily. 30 tablet   . ibuprofen (ADVIL) 200 MG tablet Take 2 tablets (400 mg  total) by mouth every 6 (six) hours as needed. (Patient taking differently: Take 400 mg by mouth every 6 (six) hours as needed for moderate pain. ) 100 tablet 2  . quinapril-hydrochlorothiazide (ACCURETIC) 20-25 MG per tablet TAKE 1 TABLET BY MOUTH EVERY DAY 30 tablet 2  . simvastatin (ZOCOR) 40 MG tablet Take 1 tablet (40 mg total) by mouth at bedtime. 30 tablet 2   Allergies: Allergies as of 03/03/2014  . (No Known Allergies)   Past Medical History  Diagnosis Date  . Hypertension   . Hyperlipidemia   . Diabetes mellitus   . Hyperplastic colon polyp 12-2007    Dr. Ardis Hughs   Past Surgical History  Procedure Laterality Date  . 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 Stroke Mother     age 29  . Heart attack Father     age 44  . Heart attack Sister     age 54  . Colon cancer      Has an aunt that died of Colon CA (late 17's-early 60's)   History   Social History  . Marital Status: Married    Spouse Name: N/A    Number of Children: N/A  . Years of Education: 11   Occupational History  . Unemployed    Social History Main Topics  . Smoking status: Never Smoker   . Smokeless tobacco: Not on file  . Alcohol  Use: No  . Drug Use: No  . Sexual Activity: Not on file     Comment: with seperated husband   Other Topics Concern  . Not on file   Social History Narrative   Lives at home with son.  Separated from husband- still sexually active with him    Review of Systems: Constitutional: no fevers/chills Eyes: +vision changes Ears, nose, mouth, throat, and face: no cough Respiratory: no shortness of breath Cardiovascular: no chest pain Gastrointestinal: +nausea/vomiting, no abdominal pain, no constipation, no diarrhea Genitourinary: no dysuria, no hematuria Integument: no rash Hematologic/lymphatic: no bleeding/bruising, +chronic edema Musculoskeletal: +chronic arthralgias, no  myalgias Neurological: +paresthesias, no weakness   Physical Exam: Blood pressure 125/63, pulse 58, temperature 97.6 F (36.4 C), resp. rate 16, height 5\' 1"  (1.549 m), weight 247 lb (112.038 kg), SpO2 92 %. General Apperance: NAD Head: Normocephalic, atraumatic Eyes: PERRL, EOMI, anicteric sclera Ears: Normal external ear canal Nose: Nares normal, septum midline, mucosa normal Throat: Lips, mucosa and tongue normal  Neck: Supple, trachea midline Back: No tenderness or bony abnormality  Lungs: Clear to auscultation bilaterally. No wheezes, rhonchi or rales. Breathing comfortably on RA Chest Wall: Nontender, no deformity Heart: Regular rate and rhythm, no murmur/rub/gallop Abdomen: Soft, nontender, nondistended, no rebound/guarding Extremities: Normal, atraumatic, warm and well perfused, 1+ pitting edema Pulses: 2+ throughout Skin: No rashes or lesions Neurologic: Alert and oriented x 3. Speech fluent without aphasia. CNII-XII intact. Normal strength and sensation bilaterally. DTRs intact. Normal finger to nose.   Lab results: Basic Metabolic Panel:  Recent Labs  03/03/14 0617  NA 140  K 3.8  CL 100  CO2 26  GLUCOSE 133*  BUN 9  CREATININE 0.73  CALCIUM 9.3   Liver Function Tests:  Recent Labs  03/03/14 0617  AST 34  ALT 27  ALKPHOS 42  BILITOT 0.4  PROT 7.2  ALBUMIN 4.0   CBC:  Recent Labs  03/03/14 0617  WBC 8.8  NEUTROABS 4.9  HGB 13.6  HCT 39.8  MCV 93.0  PLT 273   Cardiac Enzymes:  Recent Labs  03/03/14 0617  TROPONINI <0.30   CBG:  Recent Labs  03/03/14 0704  GLUCAP 131*   Urine Drug Screen: Drugs of Abuse     Component Value Date/Time   LABOPIA NEG 10/09/2009 2202   COCAINSCRNUR NEG 10/09/2009 2202   COCAINSCRNUR NONE DETECTED 04/20/2008 1759   LABBENZ NEG 10/09/2009 2202   LABBENZ NONE DETECTED 04/20/2008 1759   AMPHETMU NEG 10/09/2009 2202   AMPHETMU NONE DETECTED 04/20/2008 1759   THCU NONE DETECTED 04/20/2008 1759    LABBARB  04/20/2008 1759    NONE DETECTED        DRUG SCREEN FOR MEDICAL PURPOSES ONLY.  IF CONFIRMATION IS NEEDED FOR ANY PURPOSE, NOTIFY LAB WITHIN 5 DAYS.        LOWEST DETECTABLE LIMITS FOR URINE DRUG SCREEN Drug Class       Cutoff (ng/mL) Amphetamine      1000 Barbiturate      200 Benzodiazepine   250 Tricyclics       539 Opiates          300 Cocaine          300 THC              50    Urinalysis:  Recent Labs  03/03/14 0641  COLORURINE YELLOW  LABSPEC 1.021  PHURINE 5.0  GLUCOSEU NEGATIVE  HGBUR NEGATIVE  BILIRUBINUR NEGATIVE  KETONESUR NEGATIVE  PROTEINUR NEGATIVE  UROBILINOGEN 0.2  NITRITE NEGATIVE  LEUKOCYTESUR MODERATE*   Misc. Labs: Acetone 03/03/2014 Negative  Imaging results:  Ct Head Wo Contrast  03/03/2014   CLINICAL DATA:  Headache and vomiting.  EXAM: CT HEAD WITHOUT CONTRAST  TECHNIQUE: Contiguous axial images were obtained from the base of the skull through the vertex without intravenous contrast.  COMPARISON:  05/11/2012  FINDINGS: The brain demonstrates no evidence of hemorrhage, infarction, edema, mass effect, extra-axial fluid collection, hydrocephalus or mass lesion. The skull is unremarkable.  IMPRESSION: Normal head CT.   Electronically Signed   By: Aletta Edouard M.D.   On: 03/03/2014 07:45    Other results: EKG: normal sinus rhythm, T wave inversion in II, aVR, aVF, V5. T wave inversion in III, V4 seen previously. Prolonged QTc at 534ms.   Assessment & Plan by Problem: Active Problems:   Diabetes mellitus type 2, controlled, without complications   Essential hypertension   TIA (transient ischemic attack)  TIA: Symptoms have largely resolved. Concern for posterior circulation TIA vs CVA. She has multiple risk factors for stroke including DM2, HLD, HTN as well as family history. CT head demonstrates no acute findings. Pt refusing MRI/MRA head. EKG demonstrates nonspecific T wave changes. Initial troponin negative. ABCD2 score 4 - moderate  2 day stroke risk (4.1 percent). Pt seen by neurology. -CT angiogram of head and neck -Echo -Obtain HgbA1c, fasting lipid panel -PT/OT, Speech consult  -Continue ASA 81 daily -Trend troponins -UDS pending  HTN: BP 160/84. Allowing permissive HTN in the setting of TIA. -Hold home quinapril-HCTZ 20/25mg  daily  DM2: diet controlled -SSI sensitive, CBG q AC/HS -Hgb A1c pending  HLD: -fasting lipid panel pending -continue simvastatin 40mg  QHS  FEN: carb modified  DVT ppx: subQ hep  Dispo: Disposition is deferred at this time, awaiting improvement of current medical problems. Anticipated discharge in approximately 1-2 day(s).   The patient does have a current PCP (Charlott Rakes, MD) and does need an [facility] hospital follow-up appointment after discharge.  The patient does not have transportation limitations that hinder transportation to clinic appointments.  Signed: Jacques Earthly, MD 03/03/2014, 2:07 PM

## 2014-03-03 NOTE — ED Notes (Signed)
PA Marissa at bedside. 

## 2014-03-03 NOTE — ED Notes (Signed)
The pt  Is c/o a headache with vomiting and dizziness since yesterday

## 2014-03-03 NOTE — ED Notes (Signed)
Admitting MD at bedside.

## 2014-03-03 NOTE — Consult Note (Signed)
NEURO HOSPITALIST CONSULT NOTE    Reason for Consult: HA, dizziness  HPI:                                                                                                                                          [name] is an 58 y.o. female presenting to hospital due to new onset vertigo, blurred vision and HA upon waking at 03:30.   She states she was normal when she went to sleep at 11 last night. She awoke at 03:30 in the morning and sat up to go to the bathroom.  Upon standing she noted the room started to spin and she felt nauseated. If she stopped moving the spinning would decrease. She went to the bathroom and walked back to her bed.  Upon getting to her bed she noted a slight HA and her vision was blurred. She at this time told her husband to take her to the ED.  Currently she denies any blurred vision, vertigo, but does endorse a HA 8/10 located in the back of her head.  (she is comfortable watching TV).  She states she does have weekly HA but never diagnosed with migraine.  She usualy takes Ibuprofen to treat her HA. She denies having similiar symptoms in the past.   Past Medical History  Diagnosis Date  . Hypertension   . Hyperlipidemia   . Diabetes mellitus   . Hyperplastic colon polyp 12-2007    Dr. Ardis Hughs    Past Surgical History  Procedure Laterality Date  . 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 Stroke Mother     age 38  . Heart attack Father     age 37  . Heart attack Sister     age 12  . Colon cancer      Has an aunt that died of Colon CA (late 29's-early 60's)    Social History:  reports that she has never smoked. She does not have any smokeless tobacco history on file. She reports that she does not drink alcohol or use illicit drugs.  No Known Allergies  MEDICATIONS:  No current facility-administered medications for this encounter.   Current Outpatient Prescriptions  Medication Sig Dispense Refill  . aspirin 81 MG tablet Take 1 tablet (81 mg total) by mouth daily. 30 tablet   . ibuprofen (ADVIL) 200 MG tablet Take 2 tablets (400 mg total) by mouth every 6 (six) hours as needed. (Patient taking differently: Take 400 mg by mouth every 6 (six) hours as needed for moderate pain. ) 100 tablet 2  . quinapril-hydrochlorothiazide (ACCURETIC) 20-25 MG per tablet TAKE 1 TABLET BY MOUTH EVERY DAY 30 tablet 2  . simvastatin (ZOCOR) 40 MG tablet Take 1 tablet (40 mg total) by mouth at bedtime. 30 tablet 2     ROS:                                                                                                                                       History obtained from the patient  General ROS: negative for - chills, fatigue, fever, night sweats, weight gain or weight loss Psychological ROS: negative for - behavioral disorder, hallucinations, memory difficulties, mood swings or suicidal ideation Ophthalmic ROS: negative for - blurry vision, double vision, eye pain or loss of vision ENT ROS: negative for - epistaxis, nasal discharge, oral lesions, sore throat, tinnitus or vertigo Allergy and Immunology ROS: negative for - hives or itchy/watery eyes Hematological and Lymphatic ROS: negative for - bleeding problems, bruising or swollen lymph nodes Endocrine ROS: negative for - galactorrhea, hair pattern changes, polydipsia/polyuria or temperature intolerance Respiratory ROS: negative for - cough, hemoptysis, shortness of breath or wheezing Cardiovascular ROS: negative for - chest pain, dyspnea on exertion, edema or irregular heartbeat Gastrointestinal ROS: negative for - abdominal pain, diarrhea, hematemesis, nausea/vomiting or stool incontinence Genito-Urinary ROS: negative for - dysuria, hematuria, incontinence or urinary  frequency/urgency Musculoskeletal ROS: negative for - joint swelling or muscular weakness Neurological ROS: as noted in HPI Dermatological ROS: negative for rash and skin lesion changes   Blood pressure 132/71, pulse 57, temperature 97.6 F (36.4 C), resp. rate 15, height 5\' 1"  (1.549 m), weight 112.038 kg (247 lb), SpO2 99 %.   Neurologic Examination:                                                                                                      Physical Exam  Constitutional: He appears well-developed and well-nourished.  Psych: Affect appropriate to situation Eyes: No scleral injection HENT: No OP obstrucion Head: Normocephalic.  Cardiovascular: Normal rate and regular rhythm.  Respiratory: Effort normal and breath sounds normal.  GI: Soft. Bowel sounds are normal. No distension. There is no tenderness.  Skin: WDI  Neuro Exam: Mental Status: Alert, oriented, thought content appropriate.  Speech fluent without evidence of aphasia.  Able to follow 3 step commands without difficulty. Cranial Nerves: II: Discs flat bilaterally; Visual fields grossly normal, pupils equal, round, reactive to light and accommodation III,IV, VI: ptosis not present, extra-ocular motions intact bilaterally V,VII: smile symmetric, facial light touch sensation normal bilaterally VIII: hearing normal bilaterally IX,X: gag reflex present XI: bilateral shoulder shrug XII: midline tongue extension without atrophy or fasciculations  Motor: Right : Upper extremity   5/5    Left:     Upper extremity   5/5  Lower extremity   5/5     Lower extremity   5/5 Tone and bulk:normal tone throughout; no atrophy noted Sensory: Pinprick and light touch intact throughout, bilaterally Deep Tendon Reflexes:  Right: Upper Extremity   Left: Upper extremity   biceps (C-5 to C-6) 2/4   biceps (C-5 to C-6) 2/4 tricep (C7) 2/4    triceps (C7) 2/4 Brachioradialis (C6) 2/4  Brachioradialis (C6) 2/4  Lower Extremity Lower  Extremity  quadriceps (L-2 to L-4) 2/4   quadriceps (L-2 to L-4) 2/4 Achilles (S1) 0/4   Achilles (S1) 0/4  Plantars: Right: downgoing   Left: downgoing Cerebellar: normal finger-to-nose,  normal heel-to-shin test Gait: normal.  CV: pulses palpable throughout    Lab Results: Basic Metabolic Panel:  Recent Labs Lab 03/03/14 0617  NA 140  K 3.8  CL 100  CO2 26  GLUCOSE 133*  BUN 9  CREATININE 0.73  CALCIUM 9.3    Liver Function Tests:  Recent Labs Lab 03/03/14 0617  AST 34  ALT 27  ALKPHOS 42  BILITOT 0.4  PROT 7.2  ALBUMIN 4.0   No results for input(s): LIPASE, AMYLASE in the last 168 hours. No results for input(s): AMMONIA in the last 168 hours.  CBC:  Recent Labs Lab 03/03/14 0617  WBC 8.8  NEUTROABS 4.9  HGB 13.6  HCT 39.8  MCV 93.0  PLT 273    Cardiac Enzymes:  Recent Labs Lab 03/03/14 0617  TROPONINI <0.30    Lipid Panel: No results for input(s): CHOL, TRIG, HDL, CHOLHDL, VLDL, LDLCALC in the last 168 hours.  CBG:  Recent Labs Lab 03/03/14 0704  GLUCAP 131*    Microbiology: Results for orders placed or performed in visit on 07/31/12  Culture, Urine     Status: None   Collection Time: 07/31/12 11:30 AM  Result Value Ref Range Status   Colony Count NO GROWTH  Final   Organism ID, Bacteria NO GROWTH  Final    Coagulation Studies: No results for input(s): LABPROT, INR in the last 72 hours.  Imaging: Ct Head Wo Contrast  03/03/2014   CLINICAL DATA:  Headache and vomiting.  EXAM: CT HEAD WITHOUT CONTRAST  TECHNIQUE: Contiguous axial images were obtained from the base of the skull through the vertex without intravenous contrast.  COMPARISON:  05/11/2012  FINDINGS: The brain demonstrates no evidence of hemorrhage, infarction, edema, mass effect, extra-axial fluid collection, hydrocephalus or mass lesion. The skull is unremarkable.  IMPRESSION: Normal head CT.   Electronically Signed   By: Aletta Edouard M.D.   On: 03/03/2014 07:45        Assessment and plan per attending neurologist  Etta Quill PA-C [facility] [phone]  03/03/2014, 11:04 AM   Assessment/Plan: 58 YO female  with new onset vertigo, blurred vision and HA upon waking.  All symptoms have resolved with the exception of HA. Her symptoms of vertigo appears to be positional however given the symptoms of blurred vision cannot rule out posterior circulation CVA/TIA versus VBI.   Recommend: 1. HgbA1c, fasting lipid panel 2. CT angiogram of head and neck--patient is refusing MRI 3. PT consult, OT consult, Speech consult 4. Echocardiogram 5. Prophylactic therapy-Antiplatelet med: Aspirin - dose 81 mg  6. Risk factor modification 7. Telemetry monitoring 8. Frequent neuro checks    Patient seen and evaluated. Agree with above assessment and plan. In brief she is a 57y/o woman with hx of HTN, HLD, DM presenting with vertigo, blurred vision and HA. Symptoms have mostly resolved. Vertigo appears peripheral in nature but based on risk factors and blurred vision would have concern for posterior circulation CVA/TIA. Will admit for further workup.  Jim Like, DO [facility] [phone]  If 7pm- 7am, please page neurology on call as listed in [place].

## 2014-03-03 NOTE — ED Provider Notes (Signed)
CSN: [id number]     Arrival date & time 03/03/14  0505 History   First MD Initiated Contact with Patient 03/03/14 [phone]     Chief Complaint  Patient presents with  . Headache     (Consider location/radiation/quality/duration/timing/severity/associated sxs/prior Treatment) The history is provided by the patient. No language interpreter was used.  [name] is a 58 y/o F with PMHx of HTN, HLD, DM, colonic polyps presenting to the ED with dizziness that started when the patient woke up this morning. Patient reported that she woke up approximately 3:30AM when she had to go to the bathroom and noticed that she had this dizziness sensation. Stated that she has this room spinning sensation that is constant when sitting and even layingg down resting. Patient reported that she has been feeling nauseous and had at least 2 episodes of emesis - NB/NB. Stated that she has been experiencing numbness in her right hand. Stated that she has been having blurriness to her vision that increases when she is laying down. Stated that she felt as if she was going to faint this morning, but denied. Stated that she feel unsteady gait. Stated that she felt fine yesterday. Reported that her husband recently got over a cold, but denied cold-like symptoms. Patient reported that she does have history of DM, but stated that she has not been on medications for approximately one year and that this has been diet controlled. Denied chest pain, shortness of breath, difficulty breathing, weakness, loss of sensation, falls, injuries, LOC, cough, abdominal pain, fever, chills. PCP Dr. Posey Pronto  Past Medical History  Diagnosis Date  . Hypertension   . Hyperlipidemia   . Diabetes mellitus   . Hyperplastic colon polyp 12-2007    Dr. Ardis Hughs   Past Surgical History  Procedure Laterality Date  . 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 Stroke Mother      age 63  . Heart attack Father     age 32  . Heart attack Sister     age 3  . Colon cancer      Has an aunt that died of Colon CA (late 79's-early 60's)   History  Substance Use Topics  . Smoking status: Never Smoker   . Smokeless tobacco: Not on file  . Alcohol Use: No   OB History    No data available     Review of Systems  Constitutional: Negative for fever and chills.  Eyes: Positive for visual disturbance.  Respiratory: Negative for cough, chest tightness and shortness of breath.   Cardiovascular: Negative for chest pain and leg swelling.  Gastrointestinal: Positive for nausea and vomiting. Negative for abdominal pain, diarrhea, constipation, blood in stool and anal bleeding.  Genitourinary: Negative for decreased urine volume.  Musculoskeletal: Positive for neck pain. Negative for back pain.  Neurological: Positive for dizziness and numbness. Negative for weakness.      Allergies  Review of patient's allergies indicates no known allergies.  Home Medications   Prior to Admission medications   Medication Sig Start Date End Date Taking? Authorizing Provider  aspirin 81 MG tablet Take 1 tablet (81 mg total) by mouth daily. 08/03/12  Yes Rosalia Hammers, MD  ibuprofen (ADVIL) 200 MG tablet Take 2 tablets (400 mg total) by mouth every 6 (six) hours as needed. Patient taking differently: Take 400 mg by mouth every 6 (six) hours as needed for moderate pain.  08/16/13 08/16/14 Yes Dixon Boos, MD  quinapril-hydrochlorothiazide (ACCURETIC) 20-25 MG per tablet TAKE 1 TABLET BY MOUTH EVERY DAY   Yes Dixon Boos, MD  simvastatin (ZOCOR) 40 MG tablet Take 1 tablet (40 mg total) by mouth at bedtime. 05/03/13  Yes Dixon Boos, MD   BP 139/94 mmHg  Pulse 65  Temp(Src) 97.6 F (36.4 C)  Resp 14  Ht 5\' 1"  (1.549 m)  Wt 247 lb (112.038 kg)  BMI 46.69 kg/m2  SpO2 96% Physical Exam  Constitutional: She is oriented to person, place, and time. She appears  well-developed and well-nourished. No distress.  HENT:  Head: Normocephalic and atraumatic.  Eyes: Conjunctivae and EOM are normal. Pupils are equal, round, and reactive to light. Right eye exhibits no discharge. Left eye exhibits no discharge.  Horizontal nystagmus that is resolvable  Neck: Normal range of motion. Neck supple. No tracheal deviation present.  Negative neck stiffness Negative nuchal rigidity  Negative cervical lymphadenopathy   Cardiovascular: Regular rhythm and normal heart sounds.  Bradycardia present.  Exam reveals no friction rub.   No murmur heard. Pulmonary/Chest: Effort normal and breath sounds normal. No respiratory distress. She has no wheezes. She has no rales.  Abdominal: Soft. Bowel sounds are normal. She exhibits no distension. There is no tenderness. There is no rebound and no guarding.  Obese Negative abdominal distension  BS normoactive in all 4 quadrants Abdomen soft upon palpation  Negative peritoneal signs Negative rigidity or guarding noted Negative pain upon palpation to the abdomen   Musculoskeletal: Normal range of motion.  Full ROM to upper and lower extremities without difficulty noted, negative ataxia noted.  Lymphadenopathy:    She has no cervical adenopathy.  Neurological: She is alert and oriented to person, place, and time. No cranial nerve deficit. She exhibits normal muscle tone. Coordination normal. GCS eye subscore is 4. GCS verbal subscore is 5. GCS motor subscore is 6.  Cranial nerves III-XII grossly intact Strength 5+/5+ to upper and lower extremities bilaterally with resistance applied, equal distribution noted Equal grip strength Sensation intact with differentiation to sharp and dull touch  Negative facial droop Negative slurred speech  Negative aphasia Negative arm drift Fine motor skills intact Negative saddle paresthesias bilaterally Ataxic gait noted on exam - hesitation noted. Positive sway. Patient responds to questions  appropriately Patient follows commands well  Skin: Skin is warm. No rash noted. She is not diaphoretic. No erythema.  Psychiatric: She has a normal mood and affect. Her behavior is normal. Thought content normal.  Nursing note and vitals reviewed.   ED Course  Procedures (including critical care time)    Visual Acuity  Right Eye Distance: 20/25 Left Eye Distance: 20/40 Bilateral Distance: 20/40  Right Eye Near:   Left Eye Near:    Bilateral Near:     Results for orders placed or performed during the hospital encounter of 03/03/14  CBC with Differential  Result Value Ref Range   WBC 8.8 4.0 - 10.5 K/uL   RBC 4.28 3.87 - 5.11 MIL/uL   Hemoglobin 13.6 12.0 - 15.0 g/dL   HCT 39.8 36.0 - 46.0 %   MCV 93.0 78.0 - 100.0 fL   MCH 31.8 26.0 - 34.0 pg   MCHC 34.2 30.0 - 36.0 g/dL   RDW 13.6 11.5 - 15.5 %   Platelets 273 150 - 400 K/uL   Neutrophils Relative % 55 43 - 77 %   Neutro Abs 4.9 1.7 - 7.7 K/uL  Lymphocytes Relative 38 12 - 46 %   Lymphs Abs 3.3 0.7 - 4.0 K/uL   Monocytes Relative 5 3 - 12 %   Monocytes Absolute 0.4 0.1 - 1.0 K/uL   Eosinophils Relative 1 0 - 5 %   Eosinophils Absolute 0.1 0.0 - 0.7 K/uL   Basophils Relative 1 0 - 1 %   Basophils Absolute 0.1 0.0 - 0.1 K/uL  Comprehensive metabolic panel  Result Value Ref Range   Sodium 140 137 - 147 mEq/L   Potassium 3.8 3.7 - 5.3 mEq/L   Chloride 100 96 - 112 mEq/L   CO2 26 19 - 32 mEq/L   Glucose, Bld 133 (H) 70 - 99 mg/dL   BUN 9 6 - 23 mg/dL   Creatinine, Ser 0.73 0.50 - 1.10 mg/dL   Calcium 9.3 8.4 - 10.5 mg/dL   Total Protein 7.2 6.0 - 8.3 g/dL   Albumin 4.0 3.5 - 5.2 g/dL   AST 34 0 - 37 U/L   ALT 27 0 - 35 U/L   Alkaline Phosphatase 42 39 - 117 U/L   Total Bilirubin 0.4 0.3 - 1.2 mg/dL   GFR calc non Af Amer >90 >90 mL/min   GFR calc Af Amer >90 >90 mL/min   Anion gap 14 5 - 15  Troponin I  Result Value Ref Range   Troponin I <0.30 <0.30 ng/mL  Urinalysis, Routine w reflex microscopic  Result  Value Ref Range   Color, Urine YELLOW YELLOW   APPearance CLOUDY (A) CLEAR   Specific Gravity, Urine 1.021 1.005 - 1.030   pH 5.0 5.0 - 8.0   Glucose, UA NEGATIVE NEGATIVE mg/dL   Hgb urine dipstick NEGATIVE NEGATIVE   Bilirubin Urine NEGATIVE NEGATIVE   Ketones, ur NEGATIVE NEGATIVE mg/dL   Protein, ur NEGATIVE NEGATIVE mg/dL   Urobilinogen, UA 0.2 0.0 - 1.0 mg/dL   Nitrite NEGATIVE NEGATIVE   Leukocytes, UA MODERATE (A) NEGATIVE  Ketones, qualitative  Result Value Ref Range   Acetone, Bld NEGATIVE NEGATIVE  Urine microscopic-add on  Result Value Ref Range   Squamous Epithelial / LPF MANY (A) RARE   WBC, UA 7-10 <3 WBC/hpf   RBC / HPF 0-2 <3 RBC/hpf   Bacteria, UA RARE RARE   Urine-Other MUCOUS PRESENT   CBG monitoring, ED  Result Value Ref Range   Glucose-Capillary 131 (H) 70 - 99 mg/dL   Comment 1 Documented in Chart    Comment 2 Notify RN     Labs Review Labs Reviewed  COMPREHENSIVE METABOLIC PANEL - Abnormal; Notable for the following:    Glucose, Bld 133 (*)    All other components within normal limits  URINALYSIS, ROUTINE W REFLEX MICROSCOPIC - Abnormal; Notable for the following:    APPearance CLOUDY (*)    Leukocytes, UA MODERATE (*)    All other components within normal limits  URINE MICROSCOPIC-ADD ON - Abnormal; Notable for the following:    Squamous Epithelial / LPF MANY (*)    All other components within normal limits  CBG MONITORING, ED - Abnormal; Notable for the following:    Glucose-Capillary 131 (*)    All other components within normal limits  URINE CULTURE  CBC WITH DIFFERENTIAL  TROPONIN I  KETONES, QUALITATIVE    Imaging Review Ct Head Wo Contrast  03/03/2014   CLINICAL DATA:  Headache and vomiting.  EXAM: CT HEAD WITHOUT CONTRAST  TECHNIQUE: Contiguous axial images were obtained from the base of the skull through the vertex  without intravenous contrast.  COMPARISON:  05/11/2012  FINDINGS: The brain demonstrates no evidence of hemorrhage,  infarction, edema, mass effect, extra-axial fluid collection, hydrocephalus or mass lesion. The skull is unremarkable.  IMPRESSION: Normal head CT.   Electronically Signed   By: Aletta Edouard M.D.   On: 03/03/2014 07:45     EKG Interpretation   Date/Time:  Thursday March 03 2014 06:55:07 EST Ventricular Rate:  61 PR Interval:  177 QRS Duration: 90 QT Interval:  564 QTC Calculation: 568 R Axis:   -8 Text Interpretation:  Sinus rhythm Low voltage, precordial leads Left  ventricular hypertrophy Nonspecific T abnormalities, diffuse leads  Prolonged QT interval Baseline wander When compared with ECG of 10/05/2007  QT has lengthened Otherwise no significant change Confirmed by [facility]   MD, Nunzio Cory [phone]) on 03/03/2014 7:42:27 AM      8:14 AM This provider spoke with Dr. Janann Colonel, neurology - discussed case, labs, imaging, vitals in great detail. Recommended MRI without contrast of brain. Neurology to see patient.   11:20 AM This provider was made aware that the patient refused MRI. Reported that patient does not want medications for sedation. Patient to be brought back to room.  11:26 AM Remo Lipps for Neurology spoke with this provider. This provider discussed with neurology that patient is refusing MRI. As per neurology recommendation-reported that since patient has high risk factors with past medical history for both stroke/subacute stroke/neurological issues patient to be admitted to internal medicine with CT head to be performed tomorrow morning. Patient agreed to plan of care.   12: 42 PM This provider spoke with Dr. Redmond Pulling, internal medicine teaching services. Discussed case in great detail, labs, imaging, vitals, ED course, issues with MRI and neurology recommendations. Internal medicine teaching services to come and assess patient and admit.  MDM   Final diagnoses:  Numbness  Dizziness, nonspecific  Blurred vision, bilateral    Medications  sodium chloride 0.9 % bolus 500  mL (0 mLs Intravenous Stopped 03/03/14 0750)  sodium chloride 0.9 % bolus 1,000 mL (1,000 mLs Intravenous New Bag/Given 03/03/14 0750)  meclizine (ANTIVERT) tablet 25 mg (25 mg Oral Given 03/03/14 0745)    Filed Vitals:   03/03/14 0600 03/03/14 0615 03/03/14 1019 03/03/14 1159  BP: 113/58 108/69 132/71 139/94  Pulse: 57 58 57 65  Temp:      Resp: 17 14 15 14   Height:      Weight:      SpO2: 94% 96% 99% 96%    EKG sinus rhythm with a heart rate of 61 bpm-left ventricular per Franchot Heidelberg identified nonspecific T abnormalities, prolonged QT interval in diffuse leads-QT has lengthened with no other significant change compared to last tracing.. Troponin negative elevation. CBC unremarkable. CMP noted glucose of 133 were negative elevated anion gap-14.0 mCi per liter. Negative ketones noted. CBG 131. Urinalysis negative hemoglobin or nitrites identified-moderate leukocytes noted with many squamous cells, white blood cell count of 7-10 - urine culture pending. CT head negative for acute intracranial abnormalities. Doubt DKA. Doubt meningitis. Suspicion high for positional vertigo, peripheral, but cannot rule out possible subacute or underlying stroke secondary to patient's strong medical history. Patient seen and assessed by neurology in ED setting who recommended patient to get an MRI. In the ED, patient was brought to MRI, but patient refused secondary to claustrophobia as well as patient refusing any medications to relieve her claustrophobia. This provider voiced this and discuss this issue with neurology who recommended patient to be admitted to internal  medicine and for CT to be performed tomorrow morning. This provider spoke with internal medicine teaching services-patient to be admitted to the hospital. Discussed plan of admission with patient who agrees to plan of care. Patient stable for transfer.   Jamse Mead, PA-C 03/03/14 1244  Julianne Rice, MD 03/04/14 [phone]

## 2014-03-03 NOTE — ED Notes (Signed)
Patient to MRI at this time.

## 2014-03-03 NOTE — ED Notes (Signed)
Received a call from MRI stating pt was claustrophobic and would need meds to get MRI. Pt refusing medications and states she would "need to be put to sleep to receive MRI." PA made aware.

## 2014-03-03 NOTE — Progress Notes (Signed)
Pt is admited to 4N12 from ED. Admission vital signs are stable

## 2014-03-03 NOTE — ED Notes (Signed)
PAGED INTERNAL MED. TO [facility].

## 2014-03-04 ENCOUNTER — Encounter: Payer: Self-pay | Admitting: Internal Medicine

## 2014-03-04 DIAGNOSIS — G459 Transient cerebral ischemic attack, unspecified: Secondary | ICD-10-CM | POA: Diagnosis not present

## 2014-03-04 DIAGNOSIS — R42 Dizziness and giddiness: Secondary | ICD-10-CM

## 2014-03-04 DIAGNOSIS — H538 Other visual disturbances: Secondary | ICD-10-CM

## 2014-03-04 DIAGNOSIS — I369 Nonrheumatic tricuspid valve disorder, unspecified: Secondary | ICD-10-CM

## 2014-03-04 LAB — LIPID PANEL
CHOLESTEROL: 148 mg/dL (ref 0–200)
HDL: 46 mg/dL (ref >39)
LDL Cholesterol: 89 mg/dL (ref 0–99)
TRIGLYCERIDES: 66 mg/dL (ref <150)
Total CHOL/HDL Ratio: 3.2 RATIO
VLDL: 13 mg/dL (ref 0–40)

## 2014-03-04 LAB — URINE CULTURE

## 2014-03-04 LAB — HEMOGLOBIN A1C
HEMOGLOBIN A1C: 6.6 % — AB (ref <5.7)
Mean Plasma Glucose: 143 mg/dL — ABNORMAL HIGH (ref <117)

## 2014-03-04 LAB — GLUCOSE, CAPILLARY
Glucose-Capillary: 75 mg/dL (ref 70–99)
Glucose-Capillary: 113 mg/dL — ABNORMAL HIGH (ref 70–99)
Glucose-Capillary: 88 mg/dL (ref 70–99)
Glucose-Capillary: 93 mg/dL (ref 70–99)

## 2014-03-04 LAB — RAPID URINE DRUG SCREEN, HOSP PERFORMED
AMPHETAMINES: NOT DETECTED
BARBITURATES: NOT DETECTED
Benzodiazepines: NOT DETECTED
Cocaine: NOT DETECTED
Opiates: POSITIVE — AB
Tetrahydrocannabinol: NOT DETECTED

## 2014-03-04 LAB — TROPONIN I
Troponin I: <0.3 ng/mL (ref <0.30)
Troponin I: <0.3 ng/mL (ref <0.30)

## 2014-03-04 MED ORDER — ATORVASTATIN CALCIUM 80 MG PO TABS
ORAL_TABLET | ORAL | Status: AC
  Filled 2014-03-04: qty 1

## 2014-03-04 MED ORDER — ATORVASTATIN CALCIUM 80 MG PO TABS
80.0000 mg | ORAL_TABLET | Freq: Every day | ORAL | Status: DC
Start: 2014-03-04 — End: 2014-03-05
  Administered 2014-03-04: 80 mg via ORAL

## 2014-03-04 NOTE — Progress Notes (Signed)
SLP Cancellation Note  Patient Details Name: [name] MRN: [id number] DOB: [birth year]   Cancelled treatment:       Reason Eval/Treat Not Completed: Orders received for bedside swallow evaluation, however pt has passed the RN stroke swallow screen and diet has been initiated. Please re-order SLP services as needed.    Germain Osgood, M.A. CCC-SLP [phone]  Germain Osgood 03/04/2014, 2:11 PM

## 2014-03-04 NOTE — Evaluation (Signed)
Physical Therapy Evaluation Patient Details Name: [name] MRN: [id number] DOB: 26-Jan-1956 Today's Date: 03/04/2014   History of Present Illness  58 yo female presented to ED with dizziness (room spinning). Pt with R hand numbness, blurry vision. 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 MRI pending   Clinical Impression  Pt was seen for evaluation of her current functional level as she is planning to go home with husband, who is not present for tx.  Her movement is better than documentation from yesterday looks but is still unsafe to walk alone.  Will tentatively plan on HHPT for now, and family is currently in agreement.  Will reassess as pt is seen for tx.    Follow Up Recommendations Home health PT    Equipment Recommendations  None recommended by PT    Recommendations for Other Services       Precautions / Restrictions Precautions Precautions: Fall Restrictions Weight Bearing Restrictions: No      Mobility  Bed Mobility Overal bed mobility: Needs Assistance Bed Mobility: Sit to Supine     Supine to sit: Supervision Sit to supine: Min assist   General bed mobility comments: bed flat but no rails used  Transfers Overall transfer level: Needs assistance Equipment used: Rolling walker (2 wheeled) Transfers: Sit to/from [facility] Sit to Stand: Min guard Stand pivot transfers: Min guard       General transfer comment: Supervision for safety/balance  Ambulation/Gait Ambulation/Gait assistance: Min guard Ambulation Distance (Feet): 200 Feet Assistive device: Rolling walker (2 wheeled) Gait Pattern/deviations: Step-through pattern;Shuffle;Drifts right/left;Wide base of support Gait velocity: slow Gait velocity interpretation: Below normal speed for age/gender General Gait Details: Pt is not particularly weak on eitherlimb but demonstrates shifting laterally with walker and tends to forget it at sink washing  hands  Stairs            Wheelchair Mobility    Modified Rankin (Stroke Patients Only) Modified Rankin (Stroke Patients Only) Pre-Morbid Rankin Score: No symptoms Modified Rankin: Slight disability     Balance Overall balance assessment: Needs assistance Sitting-balance support: No upper extremity supported;Feet supported Sitting balance-Leahy Scale: Normal     Standing balance support: Bilateral upper extremity supported Standing balance-Leahy Scale: Fair Standing balance comment: walker to steady with initiation of standing and for gait                 Standardized Balance Assessment Standardized Balance Assessment : Dynamic Gait Index   Dynamic Gait Index Level Surface: Mild Impairment Gait with Horizontal Head Turns: Mild Impairment Gait with Vertical Head Turns: Mild Impairment Gait and Pivot Turn: Mild Impairment Step Over Obstacle: Normal Step Around Obstacles: Normal       Pertinent Vitals/Pain Pain Assessment: No/denies pain  BP was 122/63, pulse68 and O2 sat was 98% per nsg notes.    Home Living Family/patient expects to be discharged to:: Private residence Living Arrangements: Spouse/significant other Available Help at Discharge: Family;Available 24 hours/day Type of Home: Apartment Home Access: Stairs to enter Entrance Stairs-Rails: None Entrance Stairs-Number of Steps: 3 Home Layout: One level Home Equipment: Bedside commode;Walker - 2 wheels      Prior Function Level of Independence: Independent with assistive device(s)               Hand Dominance   Dominant Hand: Right    Extremity/Trunk Assessment   Upper Extremity Assessment: Overall WFL for tasks assessed           Lower Extremity Assessment:  Generalized weakness      Cervical / Trunk Assessment: Normal  Communication   Communication: No difficulties  Cognition Arousal/Alertness: Awake/alert Behavior During Therapy: WFL for tasks  assessed/performed Overall Cognitive Status: Within Functional Limits for tasks assessed                      General Comments General comments (skin integrity, edema, etc.): family available to support pt andto offer information about pt    Exercises        Assessment/Plan    PT Assessment Patient needs continued PT services  PT Diagnosis Generalized weakness   PT Problem List Decreased strength;Decreased range of motion;Decreased activity tolerance;Decreased balance;Decreased mobility;Decreased coordination;Obesity  PT Treatment Interventions Gait training;Functional mobility training;Therapeutic activities;Therapeutic exercise;Balance training;Stair training;Neuromuscular re-education;Patient/family education   PT Goals (Current goals can be found in the Care Plan section) Acute Rehab PT Goals Patient Stated Goal: get home PT Goal Formulation: With patient Time For Goal Achievement: 03/18/14 Potential to Achieve Goals: Good    Frequency Min 3X/week   Barriers to discharge Other (comment) (medical stability) pt has an evolving neurological event    Co-evaluation               End of Session Equipment Utilized During Treatment: Gait belt Activity Tolerance: Patient tolerated treatment well Patient left: in bed;with call bell/phone within reach;with nursing/sitter in room Nurse Communication: Mobility status         Time: 0355-9741 PT Time Calculation (min) (ACUTE ONLY): 14 min   Charges:   PT Evaluation $Initial PT Evaluation Tier I: 1 Procedure     PT G CodesRamond Dial 03/05/2014, 5:41 PM  Mee Hives, PT MS Acute Rehab Dept. Number: [phone]

## 2014-03-04 NOTE — Progress Notes (Signed)
Occupational Therapy Evaluation Patient Details Name: [name] MRN: [id number] DOB: [birth year] Today's Date: 03/04/2014    History of Present Illness 58 yo female presented to ED with dizziness (room spinning). Pt with R hand numbness, blurry vision. CT(+) 36mm L ophthalmic artery infundibulum or tiny aneurysm. MRI pending    Clinical Impression   Patient evaluated by Occupational Therapy with no further acute OT needs identified. All education has been completed and the patient has no further questions. See below for any follow-up Occupational Therapy or equipment needs. OT to sign off. Thank you for referral.      Follow Up Recommendations  No OT follow up;Supervision - Intermittent    Equipment Recommendations  Tub/shower seat    Recommendations for Other Services       Precautions / Restrictions Precautions Precautions: Fall Restrictions Weight Bearing Restrictions: No      Mobility Bed Mobility Overal bed mobility: Needs Assistance Bed Mobility: Supine to Sit     Supine to sit: Supervision     General bed mobility comments: HOB flat; use of bedrails  Transfers Overall transfer level: Needs assistance Equipment used: Rolling walker (2 wheeled) Transfers: Sit to/from Stand Sit to Stand: Supervision         General transfer comment: Supervision for safety/balance    Balance Overall balance assessment: Needs assistance Sitting-balance support: No upper extremity supported;Feet supported Sitting balance-Leahy Scale: Normal     Standing balance support: Bilateral upper extremity supported;During functional activity Standing balance-Leahy Scale: Fair                   Standardized Balance Assessment Standardized Balance Assessment : Dynamic Gait Index   Dynamic Gait Index Level Surface: Mild Impairment Gait with Horizontal Head Turns: Mild Impairment Gait with Vertical Head Turns: Mild Impairment Gait and Pivot Turn: Mild Impairment Step  Over Obstacle: Normal Step Around Obstacles: Normal      ADL Overall ADL's : Needs assistance/impaired     Grooming: Brushing hair;Wash/dry hands;Supervision/safety;Sitting                   Toilet Transfer: Supervision/safety;Ambulation;Regular Toilet;RW   Toileting- Clothing Manipulation and Hygiene: Supervision/safety;Sitting/lateral lean       Functional mobility during ADLs: Supervision/safety;Rolling walker General ADL Comments: Pt's deficits on admission have appeared to resolve. Pt completed ADLs and mobility at a supervision level.     Vision Eye Alignment: Within Functional Limits Alignment/Gaze Preference: Within Defined Limits Ocular Range of Motion: Within Functional Limits Tracking/Visual Pursuits: Able to track stimulus in all quads without difficulty   Convergence: Within functional limits   Depth Perception:  (Normal)     Perception     Praxis      Pertinent Vitals/Pain Pain Assessment: No/denies pain     Hand Dominance Right   Extremity/Trunk Assessment Upper Extremity Assessment Upper Extremity Assessment: Overall WFL for tasks assessed   Lower Extremity Assessment Lower Extremity Assessment: Overall WFL for tasks assessed   Cervical / Trunk Assessment Cervical / Trunk Assessment: Normal   Communication Communication Communication: No difficulties   Cognition Arousal/Alertness: Awake/alert Behavior During Therapy: WFL for tasks assessed/performed Overall Cognitive Status: Within Functional Limits for tasks assessed                     General Comments       Exercises       Shoulder Instructions      Home Living Family/patient expects to be discharged to:: Private residence Living Arrangements:  Spouse/significant other Available Help at Discharge: Family;Available 24 hours/day Type of Home: Apartment Home Access: Stairs to enter Entrance Stairs-Number of Steps: 3 Entrance Stairs-Rails: None Home Layout: One  level     Bathroom Shower/Tub: Tub/shower unit;Curtain Shower/tub characteristics: Architectural technologist: Standard     Home Equipment: Environmental consultant - standard;Bedside commode          Prior Functioning/Environment Level of Independence: Independent with assistive device(s)             OT Diagnosis: Generalized weakness   OT Problem List: Decreased safety awareness;Decreased knowledge of use of DME or AE;Obesity;Decreased activity tolerance;Impaired balance (sitting and/or standing)   OT Treatment/Interventions:      OT Goals(Current goals can be found in the care plan section) Acute Rehab OT Goals Patient Stated Goal: To go home OT Goal Formulation: With patient Time For Goal Achievement: 03/18/14 Potential to Achieve Goals: Good ADL Goals Pt Will Perform Upper Body Bathing: with modified independence;standing Pt Will Perform Lower Body Bathing: sit to/from stand;with modified independence Pt Will Perform Tub/Shower Transfer: Tub transfer;with modified independence;ambulating;shower seat;rolling walker  OT Frequency:     Barriers to D/C:            Co-evaluation              End of Session Equipment Utilized During Treatment: Gait belt;Rolling walker Nurse Communication: Mobility status;Precautions  Activity Tolerance: Patient tolerated treatment well Patient left: in chair;with call bell/phone within reach;with chair alarm set;with family/visitor present   Time: 1335-1404 OT Time Calculation (min): 29 min Charges:  OT General Charges $OT Visit: 1 Procedure OT Evaluation $Initial OT Evaluation Tier I: 1 Procedure OT Treatments $Self Care/Home Management : 23-37 mins G-Codes:    Redmond Baseman 03/09/14, 3:29 PM

## 2014-03-04 NOTE — Progress Notes (Signed)
STROKE TEAM PROGRESS NOTE   HISTORY Yuritzy KADIATOU OPLINGER is a 58 y.o. female presenting to hospital due to new onset vertigo, blurred vision and HA upon waking at 03:30. She states she was normal when she went to sleep at 11 last night. She awoke at 03:30 in the morning and sat up to go to the bathroom. Upon standing she noted the room started to spin and she felt nauseated. If she stopped moving the spinning would decrease. She went to the bathroom and walked back to her bed. Upon getting to her bed she noted a slight HA and her vision was blurred. She at this time told her husband to take her to the ED. Currently she denies any blurred vision, vertigo, but does endorse a HA 8/10 located in the back of her head. (she is comfortable watching TV). She states she does have weekly HA but never diagnosed with migraine. She usualy takes Ibuprofen to treat her HA. She denies having similiar symptoms in the past.    SUBJECTIVE (INTERVAL HISTORY) No family present. Pt reports she is claustrophobic. She would like to avoid MRI. May be able to do outpatient in open MRI. Reinforced compliance with medications.    OBJECTIVE Temp:  [98 F (36.7 C)-98.9 F (37.2 C)] 98.9 F (37.2 C) (12/04 0500) Pulse Rate:  [57-77] 68 (12/04 0500) Cardiac Rhythm:  [-] Normal sinus rhythm (12/03 2144) Resp:  [12-20] 18 (12/04 0500) BP: (122-160)/(63-94) 122/63 mmHg (12/04 0500) SpO2:  [92 %-99 %] 98 % (12/04 0500)   Recent Labs Lab 03/03/14 0704 03/03/14 2107 03/04/14 0708  GLUCAP 131* 117* 75    Recent Labs Lab 03/03/14 0617  NA 140  K 3.8  CL 100  CO2 26  GLUCOSE 133*  BUN 9  CREATININE 0.73  CALCIUM 9.3    Recent Labs Lab 03/03/14 0617  AST 34  ALT 27  ALKPHOS 42  BILITOT 0.4  PROT 7.2  ALBUMIN 4.0    Recent Labs Lab 03/03/14 0617  WBC 8.8  NEUTROABS 4.9  HGB 13.6  HCT 39.8  MCV 93.0  PLT 273    Recent Labs Lab 03/03/14 0617 03/03/14 2050 03/04/14 0049 03/04/14 0650   TROPONINI <0.30 <0.30 <0.30 <0.30   No results for input(s): LABPROT, INR in the last 72 hours.  Recent Labs  03/03/14 0641  COLORURINE YELLOW  LABSPEC 1.021  PHURINE 5.0  GLUCOSEU NEGATIVE  HGBUR NEGATIVE  BILIRUBINUR NEGATIVE  KETONESUR NEGATIVE  PROTEINUR NEGATIVE  UROBILINOGEN 0.2  NITRITE NEGATIVE  LEUKOCYTESUR MODERATE*       Component Value Date/Time   CHOL 148 03/04/2014 0049   TRIG 66 03/04/2014 0049   HDL 46 03/04/2014 0049   CHOLHDL 3.2 03/04/2014 0049   VLDL 13 03/04/2014 0049   LDLCALC 89 03/04/2014 0049   Lab Results  Component Value Date   HGBA1C 6.6* 03/03/2014      Component Value Date/Time   LABOPIA POSITIVE* 03/03/2014 2323   COCAINSCRNUR NONE DETECTED 03/03/2014 2323   COCAINSCRNUR NEG 10/09/2009 2202   LABBENZ NONE DETECTED 03/03/2014 2323   LABBENZ NEG 10/09/2009 2202   AMPHETMU NONE DETECTED 03/03/2014 2323   AMPHETMU NEG 10/09/2009 2202   THCU NONE DETECTED 03/03/2014 2323   LABBARB NONE DETECTED 03/03/2014 2323    No results for input(s): ETH in the last 168 hours.  Ct Angio Head W/cm &/or Wo Cm 03/03/2014    1. Minimal atherosclerosis in the neck. No arterial stenosis in the neck.  2. 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  3. 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 Given the small size of this lesion, MRA head followup (e.g. biennial) may be most valuable.  4. No acute findings in the neck.  Intermittent poor dentition.  5. Chronically abnormal skullbase and hard palate stable since 2011 and may be developmental or in part due to previous trans-sphenoidal pituitary surgery.      Ct Head Wo Contrast 03/03/2014 Normal head CT.   MRI / MRA Pending    PHYSICAL EXAM Obese middle-aged African-American lady not in distress.Awake alert. Afebrile. Head is nontraumatic. Neck is supple without bruit. Hearing is normal. Cardiac exam no murmur or gallop. Lungs are  clear to auscultation. Distal pulses are well felt. Neurological Exam ;  Awake  Alert oriented x 3. Normal speech and language.eye movements full without nystagmus.fundi were not visualized. Vision acuity and fields appear normal. Hearing is normal. Palatal movements are normal. Face symmetric. Tongue midline. Normal strength, tone, reflexes and coordination. Normal sensation. Gait deferred.     ASSESSMENT/PLAN Ms. EVANNA WASHINTON is a 58 y.o. female with history of hypertension, hyperlipidemia, diabetes mellitus, presenting with new onset of vertigo, blurred vision, and headache. She did not receive IV t-PA resolution of deficits.   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 Complicated migraine less likely  Resultant  No deficits MRI  pending  MRA  pending  Carotid Doppler please refer to the CT angiogram of the neck.  2D Echo pending  LDL 89  HgbA1c 6.6  Subcutaneous heparin for VTE prophylaxis  Diet Carb Modified with thin liquids  aspirin 81 mg orally every day prior to admission, now on aspirin 81 mg orally every day  Patient counseled to be compliant with her antithrombotic medications  Ongoing aggressive stroke risk factor management  Therapy recommendations: Pending  Disposition:  Pending  Hypertension  Home meds: Accuretic  Stable   Hyperlipidemia  Home meds: Zocor 40 mg daily prior to admission.   LDL 86, goal < 70  Consider increasing Zocor dose  Continue statin at discharge  Diabetes  HgbA1c 6.6 goal < 7.0  Controlled  Other Stroke Risk Factors  Obesity, Body mass index is 46.69 kg/(m^2).   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  Other Pertinent History    Hospital day # 1  I have personally examined this patient, reviewed notes, independently viewed imaging studies, participated in medical decision making and plan of care. I have made  any additions or clarifications directly to the above note. Agree with note above. I think her symptoms may represent brainstem TIA versus complicated migraine. Given the fact that she has no prior history of migraines will continue neurovascular evaluation.  Antony Contras, MD Medical Director [place] Pager: [phone] 03/04/2014 3:37 PM     To contact Stroke Continuity provider, please refer to [email or web address] After hours, contact General Neurology

## 2014-03-04 NOTE — Progress Notes (Signed)
Nutrition Brief Note  Patient identified on the Malnutrition Screening Tool (MST) Report  Wt Readings from Last 15 Encounters:  03/03/14 247 lb (112.038 kg)  12/17/13 247 lb 11.2 oz (112.356 kg)  08/16/13 247 lb 6.4 oz (112.22 kg)  11/23/12 247 lb 9.6 oz (112.311 kg)  07/31/12 252 lb (114.306 kg)  05/11/12 252 lb (114.306 kg)  05/04/12 245 lb 14.4 oz (111.54 kg)  11/27/11 250 lb 14.4 oz (113.807 kg)  06/17/11 249 lb 12.8 oz (113.309 kg)  02/18/11 253 lb 1.6 oz (114.805 kg)  12/11/10 258 lb 12.8 oz (117.391 kg)  11/26/10 258 lb 14.4 oz (117.436 kg)  04/30/10 266 lb 14.4 oz (121.065 kg)  04/12/10 271 lb 4.8 oz (123.061 kg)  03/07/10 277 lb 1.6 oz (125.692 kg)    Body mass index is 46.69 kg/(m^2). Patient meets criteria for Morbid Obesity based on current BMI. Patient was out of room at time of visit. Weight history shows patient's weight has been stable for the past 2 years.  Current diet order is Carb Modified. Labs and medications reviewed.   No nutrition interventions warranted at this time. If nutrition issues arise, please consult RD.   Pryor Ochoa RD, LDN Inpatient Clinical Dietitian Pager: [phone] After Hours Pager: [phone]

## 2014-03-04 NOTE — Progress Notes (Signed)
Patient ID: [name], female   DOB: January 08, 1956, 58 y.o.   MRN: [id number]  I am completing paperwork today from [place] that requires my signature:  Received 02/18/2014: Authorization of her diabetic testing supplies  I recall filling out similar paperwork a few months ago as documented in the EHR but will go ahead and resubmit with my updated NPI number in place of her former PCP, Dr. Newt Lukes.

## 2014-03-04 NOTE — Progress Notes (Signed)
  Echocardiogram 2D Echocardiogram has been performed.  Diamond Nickel 03/04/2014, 9:56 AM

## 2014-03-04 NOTE — Progress Notes (Addendum)
Subjective: NAEO. Reports feeling better overall. Vertigo, vision changes and R thumb paresthesia resolved. Denies chest pain or shortness of breath.  Objective: Vital signs in last 24 hours: Filed Vitals:   03/03/14 2112 03/04/14 0115 03/04/14 0500 03/04/14 1819  BP: 128/68 136/83 122/63 133/64  Pulse: 74 67 68 68  Temp: 98.1 F (36.7 C) 98.6 F (37 C) 98.9 F (37.2 C) 98.5 F (36.9 C)  TempSrc: Oral Oral Oral Oral  Resp: 18 18 18 20   Height:      Weight:      SpO2: 97% 97% 98%    Weight change:   Intake/Output Summary (Last 24 hours) at 03/04/14 1825 Last data filed at 03/03/14 2330  Gross per 24 hour  Intake      0 ml  Output    800 ml  Net   -800 ml   General Apperance: NAD HEENT: Normocephalic, atraumatic Neck: Supple, trachea midline Back: No tenderness or bony abnormality  Lungs: Clear to auscultation bilaterally. No wheezes, rhonchi or rales. Breathing comfortably on RA Chest Wall: Nontender, no deformity Heart: Regular rate and rhythm, no murmur/rub/gallop Abdomen: Soft, nontender, nondistended, no rebound/guarding Extremities: Normal, atraumatic, warm and well perfused, 1+ pitting edema Pulses: 2+ throughout Skin: No rashes or lesions Neurologic: Alert and oriented x 3. Speech fluent without aphasia. CNII-XII intact. Normal strength and sensation bilaterally. DTRs intact. Normal finger to nose.   Lab Results: Basic Metabolic Panel:  Recent Labs Lab 03/03/14 0617  NA 140  K 3.8  CL 100  CO2 26  GLUCOSE 133*  BUN 9  CREATININE 0.73  CALCIUM 9.3   Liver Function Tests:  Recent Labs Lab 03/03/14 0617  AST 34  ALT 27  ALKPHOS 42  BILITOT 0.4  PROT 7.2  ALBUMIN 4.0   CBC:  Recent Labs Lab 03/03/14 0617  WBC 8.8  NEUTROABS 4.9  HGB 13.6  HCT 39.8  MCV 93.0  PLT 273   Cardiac Enzymes:  Recent Labs Lab 03/03/14 2050 03/04/14 0049 03/04/14 0650  TROPONINI <0.30 <0.30 <0.30   CBG:  Recent Labs Lab 03/03/14 0704  03/03/14 2107 03/04/14 0708 03/04/14 1157 03/04/14 1729  GLUCAP 131* 117* 75 113* 88   Hemoglobin A1C:  Recent Labs Lab 03/03/14 1915  HGBA1C 6.6*   Fasting Lipid Panel:  Recent Labs Lab 03/04/14 0049  CHOL 148  HDL 46  LDLCALC 89  TRIG 66  CHOLHDL 3.2   Urine Drug Screen: Drugs of Abuse     Component Value Date/Time   LABOPIA POSITIVE* 03/03/2014 2323   COCAINSCRNUR NONE DETECTED 03/03/2014 2323   COCAINSCRNUR NEG 10/09/2009 2202   LABBENZ NONE DETECTED 03/03/2014 2323   LABBENZ NEG 10/09/2009 2202   AMPHETMU NONE DETECTED 03/03/2014 2323   AMPHETMU NEG 10/09/2009 2202   THCU NONE DETECTED 03/03/2014 2323   LABBARB NONE DETECTED 03/03/2014 2323    Urinalysis:  Recent Labs Lab 03/03/14 0641  COLORURINE YELLOW  LABSPEC 1.021  PHURINE 5.0  GLUCOSEU NEGATIVE  HGBUR NEGATIVE  BILIRUBINUR NEGATIVE  KETONESUR NEGATIVE  PROTEINUR NEGATIVE  UROBILINOGEN 0.2  NITRITE NEGATIVE  LEUKOCYTESUR MODERATE*    Micro Results: Recent Results (from the past 240 hour(s))  Urine culture     Status: None   Collection Time: 03/03/14  6:41 AM  Result Value Ref Range Status   Specimen Description URINE, CLEAN CATCH  Final   Special Requests ADDED [id number]  Final   Culture  Setup Time   Final    03/03/2014  16:55 Performed at [place]   Final    80,000 COLONIES/ML Performed at [facility]    Culture   Final    GROUP B STREP(S.AGALACTIAE)ISOLATED Note: TESTING AGAINST S. AGALACTIAE NOT ROUTINELY PERFORMED DUE TO PREDICTABILITY OF AMP/PEN/VAN SUSCEPTIBILITY. Performed at [facility]    Report Status 03/04/2014 FINAL  Final   Studies/Results: Ct Angio Head W/cm &/or Wo Cm  03/03/2014   ADDENDUM REPORT: 03/03/2014 15:01  ADDENDUM: Study discussed by telephone with [place] on 03/03/2014 at 1452 hrs.   Electronically Signed   By: Lars Pinks M.D.   On: 03/03/2014 15:01   03/03/2014   CLINICAL DATA:  58 year old  female code stroke. Initial encounter. Headache and vomiting.  EXAM: CT ANGIOGRAPHY HEAD AND NECK  TECHNIQUE: Multidetector CT imaging of the head and neck was performed using the standard protocol during bolus administration of intravenous contrast. Multiplanar CT image reconstructions and MIPs were obtained to evaluate the vascular anatomy. Carotid stenosis measurements (when applicable) are obtained utilizing NASCET criteria, using the distal internal carotid diameter as the denominator.  CONTRAST:  42mL OMNIPAQUE IOHEXOL 350 MG/ML SOLN  COMPARISON:  Non contrast head CT 0731 hr the same day, and earlier.  FINDINGS: CTA HEAD FINDINGS  Chronically abnormal skullbase, stable compared to Head CT on 12/22/2009, and hypothesis sized perhaps due to previous pituitary surgery. Paranasal sinuses are stable.  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  Grossly stable CT appearance of the brain from earlier today. No intracranial mass effect. No ventriculomegaly. No abnormal enhancement identified.  VASCULAR FINDINGS:  Major intracranial venous structures are enhancing, the left transverse and sigmoid sinuses are dominant.  Codominant distal vertebral arteries. Normal PICA origins. Normal vertebrobasilar junction. No basilar stenosis. Normal SCA and PCA origins. Posterior communicating arteries are diminutive or absent. Bilateral PCA branches are within normal limits.  Mild calcified plaque of the right ICA siphon with no stenosis. Normal right ophthalmic artery origin. Patent right ICA terminus.  Mild to moderate calcified plaque in the left ICA siphon which is more tortuous. In the cavernous segment there is up to 50 % stenosis with respect to the distal vessel. Probable small infundibulum at the left ophthalmic artery origin (see sagittal image 85 of series 403). This measures 2 mm. Patent left ICA  terminus.  MCA and ACA origins are within normal limits. Normal anterior communicating artery. Bilateral ACA branches are within normal limits. Left MCA branches are within normal limits. Right MCA branches are within normal limits.  Review of the MIP images confirms the above findings.  CTA NECK FINDINGS  Large body habitus. Negative lung apices. No superior mediastinal lymphadenopathy. Subcentimeter left thyroid lobe hypodense nodule, no followup indicated. Larynx, pharynx, parapharyngeal spaces, retropharyngeal space (retropharyngeal carotid), sublingual space, submandibular glands, parotid glands and orbits soft tissues are within normal limits. No cervical lymphadenopathy. Intermittent poor dentition. Degenerative changes in the cervical spine. No acute osseous abnormality identified.  VASCULAR FINDINGS:  Three vessel arch configuration with minimal arch atherosclerosis.  Normal right CCA origin. Mild calcified and soft plaque at the right carotid bifurcation which is retropharyngeal. Otherwise normal cervical right carotids.  No proximal right subclavian artery stenosis. Normal right vertebral artery origin. Negative right vertebral artery to the skullbase.  Normal left CCA origin. Tortuous proximal left CCA. Retropharyngeal course and retropharyngeal left carotid bifurcation  as on the right. Minimal plaque at the right ICA origin. Negative cervical left ICA.  No proximal left subclavian artery stenosis. Tortuous but otherwise normal left vertebral artery origin. Intermittently tortuous, otherwise negative cervical left vertebral artery.  Review of the MIP images confirms the above findings.  IMPRESSION: 1. Minimal atherosclerosis in the neck. No arterial stenosis in the neck. 2. 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 3. 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 Given the small size of this lesion, MRA head followup (e.g. biennial) may be  most valuable. 4. No acute findings in the neck.  Intermittent poor dentition. 5. Chronically abnormal skullbase and hard palate stable since 2011 and may be developmental or in part due to previous trans-sphenoidal pituitary surgery.  Electronically Signed: By: Lars Pinks M.D. On: 03/03/2014 14:46   Ct Head Wo Contrast  03/03/2014   CLINICAL DATA:  Headache and vomiting.  EXAM: CT HEAD WITHOUT CONTRAST  TECHNIQUE: Contiguous axial images were obtained from the base of the skull through the vertex without intravenous contrast.  COMPARISON:  05/11/2012  FINDINGS: The brain demonstrates no evidence of hemorrhage, infarction, edema, mass effect, extra-axial fluid collection, hydrocephalus or mass lesion. The skull is unremarkable.  IMPRESSION: Normal head CT.   Electronically Signed   By: Aletta Edouard M.D.   On: 03/03/2014 07:45   Ct Angio Neck W/cm &/or Wo/cm  03/03/2014   ADDENDUM REPORT: 03/03/2014 15:01  ADDENDUM: Study discussed by telephone with [place] on 03/03/2014 at 1452 hrs.   Electronically Signed   By: Lars Pinks M.D.   On: 03/03/2014 15:01   03/03/2014   CLINICAL DATA:  58 year old female code stroke. Initial encounter. Headache and vomiting.  EXAM: CT ANGIOGRAPHY HEAD AND NECK  TECHNIQUE: Multidetector CT imaging of the head and neck was performed using the standard protocol during bolus administration of intravenous contrast. Multiplanar CT image reconstructions and MIPs were obtained to evaluate the vascular anatomy. Carotid stenosis measurements (when applicable) are obtained utilizing NASCET criteria, using the distal internal carotid diameter as the denominator.  CONTRAST:  49mL OMNIPAQUE IOHEXOL 350 MG/ML SOLN  COMPARISON:  Non contrast head CT 0731 hr the same day, and earlier.  FINDINGS: CTA HEAD FINDINGS  Chronically abnormal skullbase, stable compared to Head CT on 12/22/2009, and hypothesis sized perhaps due to previous pituitary surgery. Paranasal sinuses are stable.  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  Grossly stable CT appearance of the brain from earlier today. No intracranial mass effect. No ventriculomegaly. No abnormal enhancement identified.  VASCULAR FINDINGS:  Major intracranial venous structures are enhancing, the left transverse and sigmoid sinuses are dominant.  Codominant distal vertebral arteries. Normal PICA origins. Normal vertebrobasilar junction. No basilar stenosis. Normal SCA and PCA origins. Posterior communicating arteries are diminutive or absent. Bilateral PCA branches are within normal limits.  Mild calcified plaque of the right ICA siphon with no stenosis. Normal right ophthalmic artery origin. Patent right ICA terminus.  Mild to moderate calcified plaque in the left ICA siphon which is more tortuous. In the cavernous segment there is up to 50 % stenosis with respect to the distal vessel. Probable small infundibulum at the left ophthalmic artery origin (see sagittal image 85 of series 403). This measures 2 mm. Patent left ICA terminus.  MCA and ACA origins are  within normal limits. Normal anterior communicating artery. Bilateral ACA branches are within normal limits. Left MCA branches are within normal limits. Right MCA branches are within normal limits.  Review of the MIP images confirms the above findings.  CTA NECK FINDINGS  Large body habitus. Negative lung apices. No superior mediastinal lymphadenopathy. Subcentimeter left thyroid lobe hypodense nodule, no followup indicated. Larynx, pharynx, parapharyngeal spaces, retropharyngeal space (retropharyngeal carotid), sublingual space, submandibular glands, parotid glands and orbits soft tissues are within normal limits. No cervical lymphadenopathy. Intermittent poor dentition. Degenerative changes in the cervical spine. No acute osseous abnormality identified.  VASCULAR FINDINGS:  Three  vessel arch configuration with minimal arch atherosclerosis.  Normal right CCA origin. Mild calcified and soft plaque at the right carotid bifurcation which is retropharyngeal. Otherwise normal cervical right carotids.  No proximal right subclavian artery stenosis. Normal right vertebral artery origin. Negative right vertebral artery to the skullbase.  Normal left CCA origin. Tortuous proximal left CCA. Retropharyngeal course and retropharyngeal left carotid bifurcation as on the right. Minimal plaque at the right ICA origin. Negative cervical left ICA.  No proximal left subclavian artery stenosis. Tortuous but otherwise normal left vertebral artery origin. Intermittently tortuous, otherwise negative cervical left vertebral artery.  Review of the MIP images confirms the above findings.  IMPRESSION: 1. Minimal atherosclerosis in the neck. No arterial stenosis in the neck. 2. 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 3. 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 Given the small size of this lesion, MRA head followup (e.g. biennial) may be most valuable. 4. No acute findings in the neck.  Intermittent poor dentition. 5. Chronically abnormal skullbase and hard palate stable since 2011 and may be developmental or in part due to previous trans-sphenoidal pituitary surgery.  Electronically Signed: By: Lars Pinks M.D. On: 03/03/2014 14:46   Medications: I have reviewed the patient's current medications. Scheduled Meds: . aspirin EC  81 mg Oral Daily  . heparin  5,000 Units Subcutaneous 3 times per day  . insulin aspart  0-9 Units Subcutaneous TID WC  . simvastatin  40 mg Oral QHS   Continuous Infusions:  PRN Meds:. Assessment/Plan: Active Problems:   Diabetes mellitus type 2, controlled, without complications   Essential hypertension   TIA (transient ischemic attack)   Blurred vision   Dizziness  TIA: TIA vs complicated migraine. Symptoms  have resolved. CTA of head and neck with minimal atherosclerosis in the neck. No arterial stenosis in the neck. 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 Hgb A1c 6.6. Lipid panel wnl. PT recommending home health PT. OT recommending tub/shower seat. Troponins negative x 3. EKG with nonspecific changes, QTc now wnl at 421ms -Echo read pending -Continue ASA 81 daily  HTN: Allowing permissive HTN in the setting of TIA. -Hold home quinapril-HCTZ 20/25mg  daily  DM2: diet controlled. Hgb A1c at goal. -SSI sensitive, CBG q AC/HS  HLD: -discontinue simvastatin 40mg  QHS and change to atorvastatin 80mg  QHS for secondary prevention  FEN: carb modified  DVT ppx: subQ hep  Dispo: Disposition is deferred at this time, awaiting improvement of current medical problems.  Anticipated discharge in approximately 1 day(s).   The patient does have a current PCP (Charlott Rakes, MD) and does need an [facility] hospital follow-up appointment after discharge.  The patient does not have transportation limitations that hinder transportation to clinic appointments.  Marland Kitchen  Services Needed at time of discharge: Y = Yes, Blank = No PT: Home health PT  OT: Shower stool  RN:   Equipment:   Other:     LOS: 1 day   Jacques Earthly, MD 03/04/2014, 6:25 PM

## 2014-03-05 DIAGNOSIS — E119 Type 2 diabetes mellitus without complications: Secondary | ICD-10-CM

## 2014-03-05 DIAGNOSIS — E785 Hyperlipidemia, unspecified: Secondary | ICD-10-CM

## 2014-03-05 DIAGNOSIS — I10 Essential (primary) hypertension: Secondary | ICD-10-CM

## 2014-03-05 DIAGNOSIS — G459 Transient cerebral ischemic attack, unspecified: Principal | ICD-10-CM

## 2014-03-05 LAB — GLUCOSE, CAPILLARY
Glucose-Capillary: 98 mg/dL (ref 70–99)
Glucose-Capillary: 107 mg/dL — ABNORMAL HIGH (ref 70–99)

## 2014-03-05 MED ORDER — ATORVASTATIN CALCIUM 80 MG PO TABS: 80.0000 mg | ORAL_TABLET | Freq: Every day | ORAL | Status: DC

## 2014-03-05 NOTE — Plan of Care (Signed)
Problem: Acute Treatment Outcomes Goal: Airway maintained/protected Outcome: Completed/Met Date Met:  03/05/14     

## 2014-03-05 NOTE — Plan of Care (Signed)
Problem: Acute Treatment Outcomes Goal: 02 Sats > 94% Outcome: Completed/Met Date Met:  03/05/14     

## 2014-03-05 NOTE — Discharge Summary (Signed)
Patient Name:  [name]  MRN: [id number]  PCP: Charlott Rakes, MD  DOB:  02-14-1956       Date of Admission:  03/03/2014  Date of Discharge:  03/05/2014      Attending Physician: Dr. Madilyn Fireman, MD         DISCHARGE DIAGNOSES: 1.   TIA (transient ischemic attack) 2.   Left ophthalmic artery infundibulum 3.   Essential hypertension 4.   Diabetes mellitus type 2, controlled, without complications   DISPOSITION AND FOLLOW-UP: Lissett EDIT RICCIARDELLI is to follow-up with the listed providers as detailed below, at patient's visiting, please address following issues:  1) Please follow-up the results of her 2D ECHO.  There were technical difficulties with transmission of ECHOs during her admission and the ECHO has not yet been sent for interpretation.   2) Please check her BP since I have held quinipirl-HCTZ in the setting of low-normal BP. 3) She will need to follow-up with Dr. Leonie Man in 2 months for follow-up of 45mm left ophthalmic artery infundibulum vs less likely tiny aneurysm.    Follow-up Information    Follow up with [facility].   Why:  home health physical therapy   Contact information:   [address] [place] [address] [phone]       Follow up with Charlott Rakes, MD.   Specialty:  Internal Medicine   Why:  clinic will call you with appt   Contact information:   [facility] [place] [address] [phone]      Discharge Instructions    Ambulatory referral to Neurology    Complete by:  As directed   Dr. Leonie Man requests follow up for this patient in 2 months.     Call MD for:  difficulty breathing, headache or visual disturbances    Complete by:  As directed      Call MD for:  extreme fatigue    Complete by:  As directed      Call MD for:  hives    Complete by:  As directed      Call MD for:  persistant dizziness or light-headedness    Complete by:  As directed      Call MD for:  persistant nausea and vomiting    Complete  by:  As directed      Call MD for:  severe uncontrolled pain    Complete by:  As directed      Call MD for:  temperature >100.4    Complete by:  As directed      Diet - low sodium heart healthy    Complete by:  As directed      Increase activity slowly    Complete by:  As directed             DISCHARGE MEDICATIONS:   Medication List    STOP taking these medications        ibuprofen 200 MG tablet  Commonly known as:  ADVIL     quinapril-hydrochlorothiazide 20-25 MG per tablet  Commonly known as:  ACCURETIC     simvastatin 40 MG tablet  Commonly known as:  ZOCOR      TAKE these medications        aspirin 81 MG tablet  Take 1 tablet (81 mg total) by mouth daily.     atorvastatin 80 MG tablet  Commonly known as:  LIPITOR  Take 1 tablet (80 mg total) by mouth  daily at 6 PM.         CONSULTS:    Neurology   PROCEDURES PERFORMED:  Ct Angio Head W/cm &/or Wo Cm  03/03/2014   ADDENDUM REPORT: 03/03/2014 15:01  ADDENDUM: Study discussed by telephone with [place] on 03/03/2014 at 1452 hrs.   Electronically Signed   By: Lars Pinks M.D.   On: 03/03/2014 15:01   03/03/2014   CLINICAL DATA:  58 year old female code stroke. Initial encounter. Headache and vomiting.  EXAM: CT ANGIOGRAPHY HEAD AND NECK  TECHNIQUE: Multidetector CT imaging of the head and neck was performed using the standard protocol during bolus administration of intravenous contrast. Multiplanar CT image reconstructions and MIPs were obtained to evaluate the vascular anatomy. Carotid stenosis measurements (when applicable) are obtained utilizing NASCET criteria, using the distal internal carotid diameter as the denominator.  CONTRAST:  78mL OMNIPAQUE IOHEXOL 350 MG/ML SOLN  COMPARISON:  Non contrast head CT 0731 hr the same day, and earlier.  FINDINGS: CTA HEAD FINDINGS  Chronically abnormal skullbase, stable compared to Head CT on 12/22/2009, and hypothesis sized perhaps due to previous pituitary surgery.  Paranasal sinuses are stable.  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  Grossly stable CT appearance of the brain from earlier today. No intracranial mass effect. No ventriculomegaly. No abnormal enhancement identified.  VASCULAR FINDINGS:  Major intracranial venous structures are enhancing, the left transverse and sigmoid sinuses are dominant.  Codominant distal vertebral arteries. Normal PICA origins. Normal vertebrobasilar junction. No basilar stenosis. Normal SCA and PCA origins. Posterior communicating arteries are diminutive or absent. Bilateral PCA branches are within normal limits.  Mild calcified plaque of the right ICA siphon with no stenosis. Normal right ophthalmic artery origin. Patent right ICA terminus.  Mild to moderate calcified plaque in the left ICA siphon which is more tortuous. In the cavernous segment there is up to 50 % stenosis with respect to the distal vessel. Probable small infundibulum at the left ophthalmic artery origin (see sagittal image 85 of series 403). This measures 2 mm. Patent left ICA terminus.  MCA and ACA origins are within normal limits. Normal anterior communicating artery. Bilateral ACA branches are within normal limits. Left MCA branches are within normal limits. Right MCA branches are within normal limits.  Review of the MIP images confirms the above findings.  CTA NECK FINDINGS  Large body habitus. Negative lung apices. No superior mediastinal lymphadenopathy. Subcentimeter left thyroid lobe hypodense nodule, no followup indicated. Larynx, pharynx, parapharyngeal spaces, retropharyngeal space (retropharyngeal carotid), sublingual space, submandibular glands, parotid glands and orbits soft tissues are within normal limits. No cervical lymphadenopathy. Intermittent poor dentition. Degenerative changes in the cervical spine. No acute osseous abnormality  identified.  VASCULAR FINDINGS:  Three vessel arch configuration with minimal arch atherosclerosis.  Normal right CCA origin. Mild calcified and soft plaque at the right carotid bifurcation which is retropharyngeal. Otherwise normal cervical right carotids.  No proximal right subclavian artery stenosis. Normal right vertebral artery origin. Negative right vertebral artery to the skullbase.  Normal left CCA origin. Tortuous proximal left CCA. Retropharyngeal course and retropharyngeal left carotid bifurcation as on the right. Minimal plaque at the right ICA origin. Negative cervical left ICA.  No proximal left subclavian artery stenosis. Tortuous but otherwise normal left vertebral artery origin. Intermittently tortuous, otherwise negative cervical left vertebral artery.  Review of the MIP images confirms the  above findings.  IMPRESSION: 1. Minimal atherosclerosis in the neck. No arterial stenosis in the neck. 2. 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 3. 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 Given the small size of this lesion, MRA head followup (e.g. biennial) may be most valuable. 4. No acute findings in the neck.  Intermittent poor dentition. 5. Chronically abnormal skullbase and hard palate stable since 2011 and may be developmental or in part due to previous trans-sphenoidal pituitary surgery.  Electronically Signed: By: Lars Pinks M.D. On: 03/03/2014 14:46   Ct Head Wo Contrast  03/03/2014   CLINICAL DATA:  Headache and vomiting.  EXAM: CT HEAD WITHOUT CONTRAST  TECHNIQUE: Contiguous axial images were obtained from the base of the skull through the vertex without intravenous contrast.  COMPARISON:  05/11/2012  FINDINGS: The brain demonstrates no evidence of hemorrhage, infarction, edema, mass effect, extra-axial fluid collection, hydrocephalus or mass lesion. The skull is unremarkable.  IMPRESSION: Normal head CT.   Electronically  Signed   By: Aletta Edouard M.D.   On: 03/03/2014 07:45   Ct Angio Neck W/cm &/or Wo/cm  03/03/2014   ADDENDUM REPORT: 03/03/2014 15:01  ADDENDUM: Study discussed by telephone with [place] on 03/03/2014 at 1452 hrs.   Electronically Signed   By: Lars Pinks M.D.   On: 03/03/2014 15:01   03/03/2014   CLINICAL DATA:  58 year old female code stroke. Initial encounter. Headache and vomiting.  EXAM: CT ANGIOGRAPHY HEAD AND NECK  TECHNIQUE: Multidetector CT imaging of the head and neck was performed using the standard protocol during bolus administration of intravenous contrast. Multiplanar CT image reconstructions and MIPs were obtained to evaluate the vascular anatomy. Carotid stenosis measurements (when applicable) are obtained utilizing NASCET criteria, using the distal internal carotid diameter as the denominator.  CONTRAST:  107mL OMNIPAQUE IOHEXOL 350 MG/ML SOLN  COMPARISON:  Non contrast head CT 0731 hr the same day, and earlier.  FINDINGS: CTA HEAD FINDINGS  Chronically abnormal skullbase, stable compared to Head CT on 12/22/2009, and hypothesis sized perhaps due to previous pituitary surgery. Paranasal sinuses are stable.  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  Grossly stable CT appearance of the brain from earlier today. No intracranial mass effect. No ventriculomegaly. No abnormal enhancement identified.  VASCULAR FINDINGS:  Major intracranial venous structures are enhancing, the left transverse and sigmoid sinuses are dominant.  Codominant distal vertebral arteries. Normal PICA origins. Normal vertebrobasilar junction. No basilar stenosis. Normal SCA and PCA origins. Posterior communicating arteries are diminutive or absent. Bilateral PCA branches are within normal limits.  Mild calcified plaque of the right ICA siphon with no stenosis. Normal right ophthalmic artery origin. Patent  right ICA terminus.  Mild to moderate calcified plaque in the left ICA siphon which is more tortuous. In the cavernous segment there is up to 50 % stenosis with respect to the distal vessel. Probable small infundibulum at the left ophthalmic artery origin (see sagittal image 85 of series 403). This measures 2 mm. Patent left ICA terminus.  MCA and ACA origins are within normal limits. Normal anterior communicating artery. Bilateral ACA branches are within normal limits. Left MCA branches are within normal limits. Right MCA branches are within normal limits.  Review of the MIP images confirms the above findings.  CTA NECK FINDINGS  Large body habitus.  Negative lung apices. No superior mediastinal lymphadenopathy. Subcentimeter left thyroid lobe hypodense nodule, no followup indicated. Larynx, pharynx, parapharyngeal spaces, retropharyngeal space (retropharyngeal carotid), sublingual space, submandibular glands, parotid glands and orbits soft tissues are within normal limits. No cervical lymphadenopathy. Intermittent poor dentition. Degenerative changes in the cervical spine. No acute osseous abnormality identified.  VASCULAR FINDINGS:  Three vessel arch configuration with minimal arch atherosclerosis.  Normal right CCA origin. Mild calcified and soft plaque at the right carotid bifurcation which is retropharyngeal. Otherwise normal cervical right carotids.  No proximal right subclavian artery stenosis. Normal right vertebral artery origin. Negative right vertebral artery to the skullbase.  Normal left CCA origin. Tortuous proximal left CCA. Retropharyngeal course and retropharyngeal left carotid bifurcation as on the right. Minimal plaque at the right ICA origin. Negative cervical left ICA.  No proximal left subclavian artery stenosis. Tortuous but otherwise normal left vertebral artery origin. Intermittently tortuous, otherwise negative cervical left vertebral artery.  Review of the MIP images confirms the above  findings.  IMPRESSION: 1. Minimal atherosclerosis in the neck. No arterial stenosis in the neck. 2. 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 3. 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 Given the small size of this lesion, MRA head followup (e.g. biennial) may be most valuable. 4. No acute findings in the neck.  Intermittent poor dentition. 5. Chronically abnormal skullbase and hard palate stable since 2011 and may be developmental or in part due to previous trans-sphenoidal pituitary surgery.  Electronically Signed: By: Lars Pinks M.D. On: 03/03/2014 14:46      ADMISSION DATA: H&P: Ms. YANET BALLIET is a 58 year old woman with history of HTN, HLD, DM2 presenting with vertigo, vision changes, HA, N/V since 3:30AM. She reports she was last normal prior to falling asleep this morning. She woke up this morning with the symptoms. She describes her dizziness as the room spinning. Her vision changes included fading and small images. No double vision. She also reports slurred speech and trouble with word finding this morning. Her headache was sharp located on the left and posteriorly. It was constant. She has had headaches similar to this before. No previous episodes of the other symptoms. She had nausea and two episodes of emesis, nonbloody, as a consequence of the vertigo. Lying down made her feel better. No relieving or exacerbating factors otherwise.   She reports R thumb paresthesias. Denies fevers, chills, cough, shortness of breath, chest pain, palpitations, abdominal pain, diarrhea, hematochezia, melena, dysuria, hematuria, rash, bleed/bruise/VTE hx, weakness. Occasional LE edema without acute changes. She reports her symptoms have improved presently. Family history of stroke in mother.  Physical Exam: Blood pressure 125/63, pulse 58, temperature 97.6 F (36.4 C), resp. rate 16, height 5\' 1"  (1.549 m), weight 247 lb (112.038 kg), SpO2 92  %. General Apperance: NAD Head: Normocephalic, atraumatic Eyes: PERRL, EOMI, anicteric sclera Ears: Normal external ear canal Nose: Nares normal, septum midline, mucosa normal Throat: Lips, mucosa and tongue normal  Neck: Supple, trachea midline Back: No tenderness or bony abnormality  Lungs: Clear to auscultation bilaterally. No wheezes, rhonchi or rales. Breathing comfortably on RA Chest Wall: Nontender, no deformity Heart: Regular rate and rhythm, no murmur/rub/gallop Abdomen: Soft, nontender, nondistended, no rebound/guarding Extremities: Normal, atraumatic, warm and well perfused, 1+ pitting edema Pulses: 2+ throughout Skin: No rashes or lesions Neurologic: Alert and oriented x 3. Speech fluent without aphasia. CNII-XII intact. Normal strength and sensation bilaterally. DTRs intact. Normal finger  to nose.   Labs: Basic Metabolic Panel:  Recent Labs (last 2 labs)      Recent Labs  03/03/14 0617  NA 140  K 3.8  CL 100  CO2 26  GLUCOSE 133*  BUN 9  CREATININE 0.73  CALCIUM 9.3     Liver Function Tests:  Recent Labs (last 2 labs)      Recent Labs  03/03/14 0617  AST 34  ALT 27  ALKPHOS 42  BILITOT 0.4  PROT 7.2  ALBUMIN 4.0     CBC:  Recent Labs (last 2 labs)      Recent Labs  03/03/14 0617  WBC 8.8  NEUTROABS 4.9  HGB 13.6  HCT 39.8  MCV 93.0  PLT 273     Cardiac Enzymes:  Recent Labs (last 2 labs)      Recent Labs  03/03/14 0617  TROPONINI <0.30     CBG:  Recent Labs (last 2 labs)      Recent Labs  03/03/14 0704  GLUCAP 131*      Urinalysis:  Recent Labs (last 2 labs)      Recent Labs  03/03/14 0641  COLORURINE YELLOW  LABSPEC 1.021  PHURINE 5.0  GLUCOSEU NEGATIVE  HGBUR NEGATIVE  BILIRUBINUR NEGATIVE  KETONESUR NEGATIVE  PROTEINUR NEGATIVE  UROBILINOGEN 0.2  NITRITE NEGATIVE  LEUKOCYTESUR MODERATE*     Misc. Labs: Acetone 03/03/2014  Negative   HOSPITAL COURSE: TIA vs complex migraine:  Neurology was consulted at admission and TIA work-up completed.  CT was negative for acute intracranial process.  The patient refused MRI (even if medicated or open) due to claustrophobia.  The patient reported history of headache but no definitive migraine diagnosis.  Symptoms were though to be due to TIA or complicated migraine.  Her symptoms quickly resolved (resolved at time of admission) and she was stable for discharge home.   PT evaluated her and recommended home health PT.  2D EHCO could not be transmitted for interpretation due to technical problems during her admission however her symptoms were less likely due to embolism and we confirmed with neurology that she could be discharged home with close follow-up in [facility].  Her statin was increased to Lipitor 80 at discharge and her aspirin 81mg  was continued for secondary prevention.  The patient will follow-up in [facility] on 03/11/14.  Hypertension:  We held her home quinipril-HCTZ and allowed for permissive hypertension in the setting of possible TIA/CVA.  She was actually low to normotensive so BP medications were held at discharge.  Her BP was 118/72 prior to discharge.  She will follow-up in [facility] on 03/11/14 at which time she may required re-initiation of her BP med.  Left ophthalmic artery infundibulum vs tiny aneurysm:  This was seen on CTA.  Neurology felt this was not likely an aneurysm and regardless, at this size she would just need outpatient follow-up in 2 months.  We will arrange Neuro follow-up.  Diabetes type 2:  Stable and controlled with SSI during admission.   DISCHARGE DATA: Vital Signs: BP 118/72 mmHg  Pulse 71  Temp(Src) 98.4 F (36.9 C) (Oral)  Resp 20  Ht 5\' 1"  (1.549 m)  Wt 112.038 kg (247 lb)  BMI 46.69 kg/m2  SpO2 99%  Labs: Results for orders placed or performed during the hospital encounter of 03/03/14 (from the past 24 hour(s))  Glucose, capillary     Status:  None   Collection Time: 03/04/14  5:29 PM  Result Value Ref Range  Glucose-Capillary 88 70 - 99 mg/dL  Glucose, capillary     Status: None   Collection Time: 03/04/14 10:01 PM  Result Value Ref Range   Glucose-Capillary 93 70 - 99 mg/dL   Comment 1 Documented in Chart    Comment 2 Notify RN   Glucose, capillary     Status: None   Collection Time: 03/05/14  6:31 AM  Result Value Ref Range   Glucose-Capillary 98 70 - 99 mg/dL   Comment 1 Documented in Chart    Comment 2 Notify RN   Glucose, capillary     Status: Abnormal   Collection Time: 03/05/14 11:43 AM  Result Value Ref Range   Glucose-Capillary 107 (H) 70 - 99 mg/dL   Comment 1 Notify RN    Comment 2 Documented in Chart      Services Ordered on Discharge: Y = Yes; Blank = No PT: Home health PT  OT:   RN:   Equipment: Shower chair  Other:      Time Spent on Discharge: 35 min   Signed: Duwaine Maxin PGY 1, Internal Medicine Resident 03/05/2014, 3:54 PM

## 2014-03-05 NOTE — Progress Notes (Signed)
Subjective: [name] was seen and examined this AM.  She would like to know if she can go home today.  She feels well and has not had numbness or tingling since admission.  She is able to ambulate without difficulty and is not becoming dizzy.  Appetite/bowel/bladder are normal.  Objective: Vital signs in last 24 hours: Filed Vitals:   03/04/14 1819 03/04/14 2203 03/05/14 0117 03/05/14 0529  BP: 133/64 148/79 107/47 105/60  Pulse: 68 75 73 79  Temp: 98.5 F (36.9 C) 98.1 F (36.7 C) 98.5 F (36.9 C) 98.4 F (36.9 C)  TempSrc: Oral Oral Oral Oral  Resp: 20 20 18 18   Height:      Weight:      SpO2:  100% 97% 100%   Weight change:  No intake or output data in the 24 hours ending 03/05/14 0811 General: sitting up on side of bed in NAD HEENT: [place]/AT Cardiac: RRR, no rubs, murmurs or gallops Pulm: clear to auscultation bilaterally, moving normal volumes of air Abd: soft, nontender, nondistended, BS present Ext: warm and well perfused, no pedal edema Neuro: alert and oriented X3, 5/5 MMS upper and lower extremities, sensation intact, coordination normal, gait normal    Lab Results: Basic Metabolic Panel:  Recent Labs Lab 03/03/14 0617  NA 140  K 3.8  CL 100  CO2 26  GLUCOSE 133*  BUN 9  CREATININE 0.73  CALCIUM 9.3   Liver Function Tests:  Recent Labs Lab 03/03/14 0617  AST 34  ALT 27  ALKPHOS 42  BILITOT 0.4  PROT 7.2  ALBUMIN 4.0   CBC:  Recent Labs Lab 03/03/14 0617  WBC 8.8  NEUTROABS 4.9  HGB 13.6  HCT 39.8  MCV 93.0  PLT 273   Cardiac Enzymes:  Recent Labs Lab 03/03/14 2050 03/04/14 0049 03/04/14 0650  TROPONINI <0.30 <0.30 <0.30   CBG:  Recent Labs Lab 03/03/14 2107 03/04/14 0708 03/04/14 1157 03/04/14 1729 03/04/14 2201 03/05/14 0631  GLUCAP 117* 75 113* 88 93 98   Hemoglobin A1C:  Recent Labs Lab 03/03/14 1915  HGBA1C 6.6*   Fasting Lipid Panel:  Recent Labs Lab 03/04/14 0049  CHOL 148  HDL 46  LDLCALC 89    TRIG 66  CHOLHDL 3.2   Urine Drug Screen: Drugs of Abuse     Component Value Date/Time   LABOPIA POSITIVE* 03/03/2014 2323   COCAINSCRNUR NONE DETECTED 03/03/2014 2323   COCAINSCRNUR NEG 10/09/2009 2202   LABBENZ NONE DETECTED 03/03/2014 2323   LABBENZ NEG 10/09/2009 2202   AMPHETMU NONE DETECTED 03/03/2014 2323   AMPHETMU NEG 10/09/2009 2202   THCU NONE DETECTED 03/03/2014 2323   LABBARB NONE DETECTED 03/03/2014 2323    Urinalysis:  Recent Labs Lab 03/03/14 0641  COLORURINE YELLOW  LABSPEC 1.021  PHURINE 5.0  GLUCOSEU NEGATIVE  HGBUR NEGATIVE  BILIRUBINUR NEGATIVE  KETONESUR NEGATIVE  PROTEINUR NEGATIVE  UROBILINOGEN 0.2  NITRITE NEGATIVE  LEUKOCYTESUR MODERATE*    Micro Results: Recent Results (from the past 240 hour(s))  Urine culture     Status: None   Collection Time: 03/03/14  6:41 AM  Result Value Ref Range Status   Specimen Description URINE, CLEAN CATCH  Final   Special Requests ADDED [id number]  Final   Culture  Setup Time   Final    03/03/2014 16:55 Performed at [facility]   Final    80,000 COLONIES/ML Performed at [facility]  Final    GROUP B STREP(S.AGALACTIAE)ISOLATED Note: TESTING AGAINST S. AGALACTIAE NOT ROUTINELY PERFORMED DUE TO PREDICTABILITY OF AMP/PEN/VAN SUSCEPTIBILITY. Performed at [facility]    Report Status 03/04/2014 FINAL  Final   Studies/Results: Ct Angio Head W/cm &/or Wo Cm  03/03/2014   ADDENDUM REPORT: 03/03/2014 15:01  ADDENDUM: Study discussed by telephone with [place] on 03/03/2014 at 1452 hrs.   Electronically Signed   By: Lars Pinks M.D.   On: 03/03/2014 15:01   03/03/2014   CLINICAL DATA:  58 year old female code stroke. Initial encounter. Headache and vomiting.  EXAM: CT ANGIOGRAPHY HEAD AND NECK  TECHNIQUE: Multidetector CT imaging of the head and neck was performed using the standard protocol during bolus administration of intravenous contrast.  Multiplanar CT image reconstructions and MIPs were obtained to evaluate the vascular anatomy. Carotid stenosis measurements (when applicable) are obtained utilizing NASCET criteria, using the distal internal carotid diameter as the denominator.  CONTRAST:  91mL OMNIPAQUE IOHEXOL 350 MG/ML SOLN  COMPARISON:  Non contrast head CT 0731 hr the same day, and earlier.  FINDINGS: CTA HEAD FINDINGS  Chronically abnormal skullbase, stable compared to Head CT on 12/22/2009, and hypothesis sized perhaps due to previous pituitary surgery. Paranasal sinuses are stable.  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  Grossly stable CT appearance of the brain from earlier today. No intracranial mass effect. No ventriculomegaly. No abnormal enhancement identified.  VASCULAR FINDINGS:  Major intracranial venous structures are enhancing, the left transverse and sigmoid sinuses are dominant.  Codominant distal vertebral arteries. Normal PICA origins. Normal vertebrobasilar junction. No basilar stenosis. Normal SCA and PCA origins. Posterior communicating arteries are diminutive or absent. Bilateral PCA branches are within normal limits.  Mild calcified plaque of the right ICA siphon with no stenosis. Normal right ophthalmic artery origin. Patent right ICA terminus.  Mild to moderate calcified plaque in the left ICA siphon which is more tortuous. In the cavernous segment there is up to 50 % stenosis with respect to the distal vessel. Probable small infundibulum at the left ophthalmic artery origin (see sagittal image 85 of series 403). This measures 2 mm. Patent left ICA terminus.  MCA and ACA origins are within normal limits. Normal anterior communicating artery. Bilateral ACA branches are within normal limits. Left MCA branches are within normal limits. Right MCA branches are within normal limits.  Review of the MIP images  confirms the above findings.  CTA NECK FINDINGS  Large body habitus. Negative lung apices. No superior mediastinal lymphadenopathy. Subcentimeter left thyroid lobe hypodense nodule, no followup indicated. Larynx, pharynx, parapharyngeal spaces, retropharyngeal space (retropharyngeal carotid), sublingual space, submandibular glands, parotid glands and orbits soft tissues are within normal limits. No cervical lymphadenopathy. Intermittent poor dentition. Degenerative changes in the cervical spine. No acute osseous abnormality identified.  VASCULAR FINDINGS:  Three vessel arch configuration with minimal arch atherosclerosis.  Normal right CCA origin. Mild calcified and soft plaque at the right carotid bifurcation which is retropharyngeal. Otherwise normal cervical right carotids.  No proximal right subclavian artery stenosis. Normal right vertebral artery origin. Negative right vertebral artery to the skullbase.  Normal left CCA origin. Tortuous proximal left CCA. Retropharyngeal course and retropharyngeal left carotid bifurcation as on the right. Minimal plaque at the right ICA origin. Negative cervical left ICA.  No proximal left subclavian artery stenosis. Tortuous but otherwise normal left vertebral artery origin.  Intermittently tortuous, otherwise negative cervical left vertebral artery.  Review of the MIP images confirms the above findings.  IMPRESSION: 1. Minimal atherosclerosis in the neck. No arterial stenosis in the neck. 2. 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 3. 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 Given the small size of this lesion, MRA head followup (e.g. biennial) may be most valuable. 4. No acute findings in the neck.  Intermittent poor dentition. 5. Chronically abnormal skullbase and hard palate stable since 2011 and may be developmental or in part due to previous trans-sphenoidal pituitary surgery.  Electronically Signed:  By: Lars Pinks M.D. On: 03/03/2014 14:46   Ct Angio Neck W/cm &/or Wo/cm  03/03/2014   ADDENDUM REPORT: 03/03/2014 15:01  ADDENDUM: Study discussed by telephone with [place] on 03/03/2014 at 1452 hrs.   Electronically Signed   By: Lars Pinks M.D.   On: 03/03/2014 15:01   03/03/2014   CLINICAL DATA:  58 year old female code stroke. Initial encounter. Headache and vomiting.  EXAM: CT ANGIOGRAPHY HEAD AND NECK  TECHNIQUE: Multidetector CT imaging of the head and neck was performed using the standard protocol during bolus administration of intravenous contrast. Multiplanar CT image reconstructions and MIPs were obtained to evaluate the vascular anatomy. Carotid stenosis measurements (when applicable) are obtained utilizing NASCET criteria, using the distal internal carotid diameter as the denominator.  CONTRAST:  74mL OMNIPAQUE IOHEXOL 350 MG/ML SOLN  COMPARISON:  Non contrast head CT 0731 hr the same day, and earlier.  FINDINGS: CTA HEAD FINDINGS  Chronically abnormal skullbase, stable compared to Head CT on 12/22/2009, and hypothesis sized perhaps due to previous pituitary surgery. Paranasal sinuses are stable.  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  Grossly stable CT appearance of the brain from earlier today. No intracranial mass effect. No ventriculomegaly. No abnormal enhancement identified.  VASCULAR FINDINGS:  Major intracranial venous structures are enhancing, the left transverse and sigmoid sinuses are dominant.  Codominant distal vertebral arteries. Normal PICA origins. Normal vertebrobasilar junction. No basilar stenosis. Normal SCA and PCA origins. Posterior communicating arteries are diminutive or absent. Bilateral PCA branches are within normal limits.  Mild calcified plaque of the right ICA siphon with no stenosis. Normal right ophthalmic artery origin. Patent right ICA  terminus.  Mild to moderate calcified plaque in the left ICA siphon which is more tortuous. In the cavernous segment there is up to 50 % stenosis with respect to the distal vessel. Probable small infundibulum at the left ophthalmic artery origin (see sagittal image 85 of series 403). This measures 2 mm. Patent left ICA terminus.  MCA and ACA origins are within normal limits. Normal anterior communicating artery. Bilateral ACA branches are within normal limits. Left MCA branches are within normal limits. Right MCA branches are within normal limits.  Review of the MIP images confirms the above findings.  CTA NECK FINDINGS  Large body habitus. Negative lung apices. No superior mediastinal lymphadenopathy. Subcentimeter left thyroid lobe hypodense nodule, no followup indicated. Larynx, pharynx, parapharyngeal spaces, retropharyngeal space (retropharyngeal carotid), sublingual space, submandibular glands, parotid glands and orbits soft tissues are within normal limits. No cervical lymphadenopathy. Intermittent poor dentition. Degenerative changes in the cervical spine. No acute osseous abnormality identified.  VASCULAR FINDINGS:  Three vessel arch configuration with minimal arch atherosclerosis.  Normal right CCA origin. Mild calcified  and soft plaque at the right carotid bifurcation which is retropharyngeal. Otherwise normal cervical right carotids.  No proximal right subclavian artery stenosis. Normal right vertebral artery origin. Negative right vertebral artery to the skullbase.  Normal left CCA origin. Tortuous proximal left CCA. Retropharyngeal course and retropharyngeal left carotid bifurcation as on the right. Minimal plaque at the right ICA origin. Negative cervical left ICA.  No proximal left subclavian artery stenosis. Tortuous but otherwise normal left vertebral artery origin. Intermittently tortuous, otherwise negative cervical left vertebral artery.  Review of the MIP images confirms the above findings.   IMPRESSION: 1. Minimal atherosclerosis in the neck. No arterial stenosis in the neck. 2. 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 3. 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 Given the small size of this lesion, MRA head followup (e.g. biennial) may be most valuable. 4. No acute findings in the neck.  Intermittent poor dentition. 5. Chronically abnormal skullbase and hard palate stable since 2011 and may be developmental or in part due to previous trans-sphenoidal pituitary surgery.  Electronically Signed: By: Lars Pinks M.D. On: 03/03/2014 14:46   Medications: I have reviewed the patient's current medications. Scheduled Meds: . aspirin EC  81 mg Oral Daily  . atorvastatin  80 mg Oral q1800  . heparin  5,000 Units Subcutaneous 3 times per day  . insulin aspart  0-9 Units Subcutaneous TID WC   Continuous Infusions:  PRN Meds:. Assessment/Plan:  TIA: CTA of head and neck with minimal atherosclerosis in the neck. No arterial stenosis in the neck. 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  Symptoms resolved, she feels well and ready to go home.  Appreciate neuro recs.   -Echo read pending (system-wide problem with transmission of ECHOs at this time) -Continue ASA 81 daily -continue Zocor 40mg  daily, ASCVD 10 year risk is 6.6%  -outpatient follow-up for ophthalmic artery infundibulum  HTN: Allowing permissive HTN in the setting of TIA.  BP stable.  -Hold home quinapril-HCTZ 20/25mg  daily in the setting of TIA and normotension.  DM2: diet controlled. Hgb A1c at goal.  CBGs stable.  -SSI sensitive, CBG q AC/HS  HLD: -discontinue simvastatin 40mg  QHS and change to atorvastatin 80mg  QHS for secondary prevention  FEN: carb modified  DVT ppx: subQ hep  Dispo: She is stable for d/c home today.  2D ECHO still awaiting  interpretation but her symptoms seem less likely embolic.  I spoke with neuro and they are in agreement.  Will have her follow-up in [facility] early next week to review ECHO results.  The patient does have a current PCP (Charlott Rakes, MD) and does need an [facility] hospital follow-up appointment after discharge.  The patient does not have transportation limitations that hinder transportation to clinic appointments.  .Services Needed at time of discharge: Y = Yes, Blank = No PT: Home health PT  OT: Shower stool  RN:   Equipment:   Other:     LOS: 2 days   Francesca Oman, DO 03/05/2014, 8:11 AM

## 2014-03-05 NOTE — Progress Notes (Signed)
STROKE TEAM PROGRESS NOTE   HISTORY [name] is a 58 y.o. female presenting to hospital due to new onset vertigo, blurred vision and HA upon waking at 03:30. She states she was normal when she went to sleep at 11 last night. She awoke at 03:30 in the morning and sat up to go to the bathroom. Upon standing she noted the room started to spin and she felt nauseated. If she stopped moving the spinning would decrease. She went to the bathroom and walked back to her bed. Upon getting to her bed she noted a slight HA and her vision was blurred. She at this time told her husband to take her to the ED. Currently she denies any blurred vision, vertigo, but does endorse a HA 8/10 located in the back of her head. (she is comfortable watching TV). She states she does have weekly HA but never diagnosed with migraine. She usualy takes Ibuprofen to treat her HA. She denies having similiar symptoms in the past.    SUBJECTIVE (INTERVAL HISTORY) The patient feels she is back to baseline. She is adamant that she does not want an MRI even in an open scanner. Will hold off MRI as it will not change management at this time. She stated that She will having migraine headaches about every 2 weeks. Her most recent migraine was worse than normal. She has never had associated neurologic symptoms such as dizziness or blurry vision with previous migraines. She stated that this time dizziness is not vertigo but lightheadedness.   OBJECTIVE Temp:  [98.1 F (36.7 C)-98.5 F (36.9 C)] 98.4 F (36.9 C) (12/05 0859) Pulse Rate:  [68-79] 72 (12/05 0859) Cardiac Rhythm:  [-] Normal sinus rhythm (12/04 1955) Resp:  [18-20] 20 (12/05 0859) BP: (105-148)/(47-79) 117/68 mmHg (12/05 0859) SpO2:  [97 %-100 %] 97 % (12/05 0859)   Recent Labs Lab 03/04/14 0708 03/04/14 1157 03/04/14 1729 03/04/14 2201 03/05/14 0631  GLUCAP 75 113* 88 93 98    Recent Labs Lab 03/03/14 0617  NA 140  K 3.8  CL 100  CO2 26  GLUCOSE  133*  BUN 9  CREATININE 0.73  CALCIUM 9.3    Recent Labs Lab 03/03/14 0617  AST 34  ALT 27  ALKPHOS 42  BILITOT 0.4  PROT 7.2  ALBUMIN 4.0    Recent Labs Lab 03/03/14 0617  WBC 8.8  NEUTROABS 4.9  HGB 13.6  HCT 39.8  MCV 93.0  PLT 273    Recent Labs Lab 03/03/14 0617 03/03/14 2050 03/04/14 0049 03/04/14 0650  TROPONINI <0.30 <0.30 <0.30 <0.30   No results for input(s): LABPROT, INR in the last 72 hours.  Recent Labs  03/03/14 0641  COLORURINE YELLOW  LABSPEC 1.021  PHURINE 5.0  GLUCOSEU NEGATIVE  HGBUR NEGATIVE  BILIRUBINUR NEGATIVE  KETONESUR NEGATIVE  PROTEINUR NEGATIVE  UROBILINOGEN 0.2  NITRITE NEGATIVE  LEUKOCYTESUR MODERATE*       Component Value Date/Time   CHOL 148 03/04/2014 0049   TRIG 66 03/04/2014 0049   HDL 46 03/04/2014 0049   CHOLHDL 3.2 03/04/2014 0049   VLDL 13 03/04/2014 0049   LDLCALC 89 03/04/2014 0049   Lab Results  Component Value Date   HGBA1C 6.6* 03/03/2014      Component Value Date/Time   LABOPIA POSITIVE* 03/03/2014 2323   COCAINSCRNUR NONE DETECTED 03/03/2014 2323   COCAINSCRNUR NEG 10/09/2009 2202   LABBENZ NONE DETECTED 03/03/2014 2323   LABBENZ NEG 10/09/2009 2202   AMPHETMU NONE DETECTED 03/03/2014  2323   AMPHETMU NEG 10/09/2009 2202   THCU NONE DETECTED 03/03/2014 2323   LABBARB NONE DETECTED 03/03/2014 2323    No results for input(s): ETH in the last 168 hours.  Ct Angio Head and neck W/cm &/or Wo Cm 03/03/2014    1. Minimal atherosclerosis in the neck. No arterial stenosis in the neck.  2. 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  3. 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 Given the small size of this lesion, MRA head followup (e.g. biennial) may be most valuable.  4. No acute findings in the neck.  Intermittent poor dentition.  5. Chronically abnormal skullbase and hard palate stable since 2011 and may be developmental or in  part due to previous trans-sphenoidal pituitary surgery.    Ct Head Wo Contrast 03/03/2014 Normal head CT.  MRI / MRA - patient claustrophobic and will not consider even an open MRI.  2D echo - pending  LDL 89 and A1C 6.5   PHYSICAL EXAM Obese middle-aged African-American lady not in distress.Awake alert. Afebrile. Head is nontraumatic. Neck is supple without bruit. Hearing is normal. Cardiac exam no murmur or gallop. Lungs are clear to auscultation. Distal pulses are well felt. Neurological Exam ;  Awake  Alert oriented x 3. Normal speech and language.eye movements full without nystagmus.fundi were not visualized. Vision acuity and fields appear normal. Hearing is normal. Palatal movements are normal. Face symmetric. Tongue midline. Normal strength, tone, reflexes and coordination. Normal sensation. Gait deferred.   ASSESSMENT/PLAN Ms. [name] is a 58 y.o. female with history of hypertension, hyperlipidemia, diabetes mellitus, presenting with new onset of vertigo, blurred vision, and headache. She did not receive IV t-PA resolution of deficits.   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  Carotid Doppler please refer to the CT angiogram of the neck.  CTA head and neck - see above  2D Echo pending  LDL 89, not at goal < 70  HgbA1c 6.6, at the goal < 7.0  Subcutaneous heparin for VTE prophylaxis  Diet Carb Modified with thin liquids  aspirin 81 mg orally every day prior to admission, now on aspirin 81 mg orally every day  Patient counseled to be compliant with her antithrombotic medications  Ongoing aggressive stroke risk factor management  Therapy recommendations: Home health physical therapy recommended.  Disposition:  Pending  Hypertension  Home meds: Accuretic  BP stable  No need of BP meds at this time.  Hyperlipidemia  Home meds: Zocor 40 mg daily prior  to admission.   LDL 86, goal < 70  Changed to Lipitor 80 mg daily  Continue statin at discharge  Diabetes  HgbA1c 6.6 goal < 7.0  Controlled  Other Stroke Risk Factors  Obesity, Body mass index is 46.69 kg/(m^2).   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 - Monitor on an outpatient basis.   The stroke team will sign off at this time. Please call if we can be of further service. Follow-up with Dr. Leonie Man in 2 months.  Mikey Bussing PA-C [facility] Pager [phone] 03/05/2014, 9:09 AM  Hospital day # 2  I, the attending vascular neurologist, have personally obtained a history, examined the patient, evaluated laboratory data, individually viewed imaging studies. Together with  the NP/PA, we formulated the assessment and plan of care which reflects our mutual decision.  I have made any additions or clarifications directly to the above note and agree with the findings and plan as currently documented.   Please note: All text in blue color in the note is my addition to the original note.   Rosalin Hawking, MD PhD Stroke Neurology 03/05/2014 6:00 PM    To contact Stroke Continuity provider, please refer to [email or web address] After hours, contact General Neurology

## 2014-03-05 NOTE — Progress Notes (Signed)
Discharge orders received. Pt for discharge home today with [facility]. IV and telemetry D/C'd. rescription and D/C instructions given with verbalized understanding. Family at bedside to assist with D/C. Staff brought Pt downstairs via wheelchair for D/C. Fortino Sic, RN, BSN 03/05/2014 4:37 PM

## 2014-03-05 NOTE — Plan of Care (Signed)
Problem: Progression Outcomes Goal: If vent dependent, tolerates weaning Outcome: Not Applicable Date Met:  98/10/25

## 2014-03-05 NOTE — Discharge Instructions (Addendum)
1. Your symptoms may have been due to a migraine or a TIA.  We are waiting on the results of your ECHO.  Someone from the clinic will call you with an appointment for next week.  We will plan to review the results of the ECHO with you at that time.    2. Please take all medications as prescribed.   3. If you have worsening of your symptoms or new symptoms arise, please call the clinic ([phone]), or go to the ER immediately if symptoms are severe.

## 2014-03-05 NOTE — Care Management Note (Signed)
    Page 1 of 2   03/05/2014     4:02:12 PM CARE MANAGEMENT NOTE 03/05/2014  Patient:  [name], [name]   Account Number:  [id number]  Date Initiated:  03/05/2014  Documentation initiated by:  [facility]  Subjective/Objective Assessment:   adm: new onset vertigo, blurred vision and HA     Action/Plan:   discharge planning   Anticipated DC Date:  03/05/2014   Anticipated DC Plan:  [place]  CM consult      [facility] Choice  HOME HEALTH   Choice offered to / List presented to:  C-1 Patient   DME arranged  [place]      DME agency  [place] arranged  [place].   Status of service:  Completed, signed off Medicare Important Message given?   (If response is "NO", the following Medicare IM given date fields will be blank) Date Medicare IM given:   Medicare IM given by:   Date Additional Medicare IM given:   Additional Medicare IM given by:    Discharge Disposition:  [place]  Per UR Regulation:    If discussed at Long Length of Stay Meetings, dates discussed:    Comments:  03/05/14 15:35 CM spoke with pt to offer choice of home health agency.  Pt states her family has used [facility] in past and gives permission to give referral to [facility].  Address and contact information verified with pt.  Referral called to [facility] rep, Lecretia.  CM called [facility] DME rep, james to please deliver the shower stool to the room prior to discharge. No other CM needs were communicated.  Aura Dials, BSN, [place].

## 2014-03-05 NOTE — Plan of Care (Signed)
Problem: Acute Treatment Outcomes Goal: tPA Patient w/o S&S of bleeding Outcome: Not Applicable Date Met:  57/02/20

## 2014-03-07 LAB — GLUCOSE, CAPILLARY: Glucose-Capillary: 115 mg/dL — ABNORMAL HIGH (ref 70–99)

## 2014-03-11 ENCOUNTER — Ambulatory Visit (INDEPENDENT_AMBULATORY_CARE_PROVIDER_SITE_OTHER): Payer: Medicare HMO | Admitting: Internal Medicine

## 2014-03-11 ENCOUNTER — Encounter: Payer: Self-pay | Admitting: Internal Medicine

## 2014-03-11 VITALS — BP 139/79 | HR 66 | Temp 97.6°F | Ht 62.0 in | Wt 234.6 lb

## 2014-03-11 DIAGNOSIS — G459 Transient cerebral ischemic attack, unspecified: Secondary | ICD-10-CM

## 2014-03-11 DIAGNOSIS — E119 Type 2 diabetes mellitus without complications: Secondary | ICD-10-CM

## 2014-03-11 DIAGNOSIS — I10 Essential (primary) hypertension: Secondary | ICD-10-CM

## 2014-03-11 NOTE — Patient Instructions (Signed)
General Instructions:  Please check your blood pressure when you go to the drug stores, write it down. Bring it to clinic on your next visit. We want your blood pressure to be less than 140/90.  Please reduce the amount of salt you add in your diet, avoid canned foods, avoid Bacon, sausage and Ham.  Take walks, and do exercise. This will help with your blood pressure.  For your knee pains, you most likley have osteoarthritis, cont with the tylenol. You can also try over the counter ibuprofen or advil or aleve or motrin as needed. Try not to take more than 2 tablets a day, and about 3 days a week. Always eat when taking them.   Thank you for bringing your medicines today. This helps [place] keep you safe from mistakes.    Hypertension Hypertension, commonly called high blood pressure, is when the force of blood pumping through your arteries is too strong. Your arteries are the blood vessels that carry blood from your heart throughout your body. A blood pressure reading consists of a higher number over a lower number, such as 110/72. The higher number (systolic) is the pressure inside your arteries when your heart pumps. The lower number (diastolic) is the pressure inside your arteries when your heart relaxes. Ideally you want your blood pressure below 120/80. Hypertension forces your heart to work harder to pump blood. Your arteries may become narrow or stiff. Having hypertension puts you at risk for heart disease, stroke, and other problems.  RISK FACTORS Some risk factors for high blood pressure are controllable. Others are not.  Risk factors you cannot control include:   Race. You may be at higher risk if you are African American.  Age. Risk increases with age.  Gender. Men are at higher risk than women before age 67 years. After age 62, women are at higher risk than men. Risk factors you can control include:  Not getting enough exercise or physical activity.  Being overweight.  Getting  too much fat, sugar, calories, or salt in your diet.  Drinking too much alcohol. SIGNS AND SYMPTOMS Hypertension does not usually cause signs or symptoms. Extremely high blood pressure (hypertensive crisis) may cause headache, anxiety, shortness of breath, and nosebleed. DIAGNOSIS  To check if you have hypertension, your health care provider will measure your blood pressure while you are seated, with your arm held at the level of your heart. It should be measured at least twice using the same arm. Certain conditions can cause a difference in blood pressure between your right and left arms. A blood pressure reading that is higher than normal on one occasion does not mean that you need treatment. If one blood pressure reading is high, ask your health care provider about having it checked again. TREATMENT  Treating high blood pressure includes making lifestyle changes and possibly taking medicine. Living a healthy lifestyle can help lower high blood pressure. You may need to change some of your habits. Lifestyle changes may include:  Following the DASH diet. This diet is high in fruits, vegetables, and whole grains. It is low in salt, red meat, and added sugars.  Getting at least 2 hours of brisk physical activity every week.  Losing weight if necessary.  Not smoking.  Limiting alcoholic beverages.  Learning ways to reduce stress. If lifestyle changes are not enough to get your blood pressure under control, your health care provider may prescribe medicine. You may need to take more than one. Work closely with your  health care provider to understand the risks and benefits. HOME CARE INSTRUCTIONS  Have your blood pressure rechecked as directed by your health care provider.   Take medicines only as directed by your health care provider. Follow the directions carefully. Blood pressure medicines must be taken as prescribed. The medicine does not work as well when you skip doses. Skipping doses  also puts you at risk for problems.   Do not smoke.   Monitor your blood pressure at home as directed by your health care provider. SEEK MEDICAL CARE IF:   You think you are having a reaction to medicines taken.  You have recurrent headaches or feel dizzy.  You have swelling in your ankles.  You have trouble with your vision. SEEK IMMEDIATE MEDICAL CARE IF:  You develop a severe headache or confusion.  You have unusual weakness, numbness, or feel faint.  You have severe chest or abdominal pain.  You vomit repeatedly.  You have trouble breathing. MAKE SURE YOU:   Understand these instructions.  Will watch your condition.  Will get help right away if you are not doing well or get worse.   Exercise to Lose Weight Exercise and a healthy diet may help you lose weight. Your doctor may suggest specific exercises. EXERCISE IDEAS AND TIPS  Choose low-cost things you enjoy doing, such as walking, bicycling, or exercising to workout videos.  Take stairs instead of the elevator.  Walk during your lunch break.  Park your car further away from work or school.  Go to a gym or an exercise class.  Start with 5 to 10 minutes of exercise each day. Build up to 30 minutes of exercise 4 to 6 days a week.  Wear shoes with good support and comfortable clothes.  Stretch before and after working out.  Work out until you breathe harder and your heart beats faster.  Drink extra water when you exercise.  Do not do so much that you hurt yourself, feel dizzy, or get very short of breath. Exercises that burn about 150 calories:  Running 1  miles in 15 minutes.  Playing volleyball for 45 to 60 minutes.  Washing and waxing a car for 45 to 60 minutes.  Playing touch football for 45 minutes.  Walking 1  miles in 35 minutes.  Pushing a stroller 1  miles in 30 minutes.  Playing basketball for 30 minutes.  Raking leaves for 30 minutes.  Bicycling 5 miles in 30  minutes.  Walking 2 miles in 30 minutes.  Dancing for 30 minutes.  Shoveling snow for 15 minutes.  Swimming laps for 20 minutes.  Walking up stairs for 15 minutes.  Bicycling 4 miles in 15 minutes.  Gardening for 30 to 45 minutes.  Jumping rope for 15 minutes.  Washing windows or floors for 45 to 60 minutes.

## 2014-03-12 NOTE — Progress Notes (Signed)
Patient ID: [name], female   DOB: [birth year], 58 y.o.   MRN: [id number] ,   Subjective:   Patient ID: [name] female   DOB: Aug 03, 1958 58 y.o.   MRN: [id number]  HPI: [name] is a 58 y.o. with PMH listed below. Presented today for hospital follow up. Pt was admitted- 03/03/2014 and discharged- 03/05/2014, pt was managed for TIA. MRI was not done during admission, as pt is very claustrophobic and wouldn't do it even if sedated with meds. Otherwise TIA work up was negative, but ECHO was not read as there was a system shut down at the time. Pt today denies any symptoms suggestive of new stroke, initial dizziness, slurred speech and difficulty finding words have resolved.   Past Medical History  Diagnosis Date  . Hypertension   . Hyperlipidemia   . Diabetes mellitus   . Hyperplastic colon polyp 12-2007    Dr. Ardis Hughs   Current Outpatient Prescriptions  Medication Sig Dispense Refill  . aspirin 81 MG tablet Take 1 tablet (81 mg total) by mouth daily. 30 tablet   . atorvastatin (LIPITOR) 80 MG tablet Take 1 tablet (80 mg total) by mouth daily at 6 PM. 30 tablet 1   No current facility-administered medications for this visit.   Family History  Problem Relation Age of Onset  . Stroke Mother     age 42  . Heart attack Father     age 109  . Heart attack Sister     age 40  . Colon cancer      Has an aunt that died of Colon CA (late 63's-early 60's)   History   Social History  . Marital Status: Married    Spouse Name: N/A    Number of Children: N/A  . Years of Education: 11   Occupational History  . Unemployed    Social History Main Topics  . Smoking status: Never Smoker   . Smokeless tobacco: None  . Alcohol Use: No  . Drug Use: No  . Sexual Activity: None     Comment: with seperated husband   Other Topics Concern  . None   Social History Narrative   Lives at home with son.  Separated from husband- still sexually active with him   Review of  Systems: CONSTITUTIONAL- No Fever, weightloss, night sweat or change in appetite. SKIN- No Rash, colour changes or itching. HEAD- No Headache or dizziness. EYES- No Vision loss, pain, redness, double or blurred vision. RESPIRATORY- No Cough or SOB. CARDIAC- No Palpitations, DOE, PND or chest pain. GI- No nausea, vomiting, diarrhoea, constipation, abd pain. URINARY- No Frequency, urgency, straining or dysuria. NEUROLOGIC- No Numbness, syncope, seizures or burning. [facility]- Denies depression or anxiety.  Objective:  Physical Exam: Filed Vitals:   03/11/14 1113  BP: 139/79  Pulse: 66  Temp: 97.6 F (36.4 C)  TempSrc: Oral  Height: 5\' 2"  (1.575 m)  Weight: 234 lb 9.6 oz (106.414 kg)  SpO2: 100%   GENERAL- alert, co-operative, appears as stated age, not in any distress. HEENT- Atraumatic, normocephalic, PERRL, EOMI, oral mucosa appears moist,  neck supple. CARDIAC- RRR, no murmurs, rubs or gallops. ABDOMEN- Soft, nontender, along the vertebrae, no CVA tenderness. NEURO- No obvious Cr N abnormality, strenght upper and lower extremities- intact, Gait- Normal. EXTREMITIES- pulse 2+, symmetric, no pedal edema. SKIN- Warm, dry, No rash or lesion. PSYCH- Normal mood and affect, appropriate thought content and speech.  Assessment & Plan:   The patient's case  and plan of care was discussed with attending physician, Dr. Daryll Drown.  Please see problem based charting for assessment and plan.

## 2014-03-12 NOTE — Assessment & Plan Note (Addendum)
Ct scan negative for hemorrhage, MRi not done due to pats claustrophobia, and pt declined even with option of medication. Stroke Work up negative, though ECHo read pending, due to system wide shut down. Compliant with Aspirin 81mg  and lipitor- 80mg  daily. Pt presently on no meds for Dm and HTN, and doing well. Monitor closely. Called Vascular lab, results have been read and will be scanned into EPIC. Will up date pts chart with infor when available. 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 Given the small size of this lesion, MRA head follow-up (e.g. biennial) may be most valuable. Will need follow up if pt will agree to an MRI in the future.  Plan- Cont ASA 81mg  and Lipitor- 80mg  daily.  Addendum- Echo- 03/04/2014- EF- 55-60%, no wall motion abnormality- NO patent foramen ovale or thrombus identified.

## 2014-03-12 NOTE — Assessment & Plan Note (Signed)
BP Readings from Last 3 Encounters:  03/11/14 139/79  03/05/14 118/72  12/17/13 132/77    Lab Results  Component Value Date   NA 140 03/03/2014   K 3.8 03/03/2014   CREATININE 0.73 03/03/2014    Assessment: Blood pressure control:  Controlled Progress toward BP goal:   At goal Comments: Not on meds. Bp meds- Quinapril-HCTZ- 20-25mg  were held through out admission, initaily to allow for permissive hypertension in teh setting of recent CVA event, but later pts Bp was low Normal.   Plan: Medications:  Considering BP is at goal at this time. Told pt we could restart Bp meds at half the dose, considering hx of TIA, or opt not to restart meds and try conservative management. Pt opted for the later. Pt told to check Bp daily or at [place], and bring log next visit. Also told to watch diet, reduce salt, exercise, and loose weight. Hx of non compliance in the past, with normal BP. Goal Bp also told to pt, <140./90. Pt voiced undertsnading. Pt does not smoke. Educational resources provided:   Self management tools provided:   Other plans: See in 1 month with Log.

## 2014-03-12 NOTE — Assessment & Plan Note (Signed)
Lab Results  Component Value Date   HGBA1C 6.6* 03/03/2014   HGBA1C 6.5 12/17/2013   HGBA1C 6.5 08/16/2013     Assessment: Diabetes control:  Controlled Progress toward A1C goal:   At goal Comments: On no meds  Plan: Medications:  Pt encouraged to continue watching her diet and exercise. Home glucose monitoring: Frequency:   Timing:   Instruction/counseling given: discussed diet Educational resources provided: handout Self management tools provided:   Other plans:

## 2014-03-14 NOTE — Progress Notes (Signed)
Internal Medicine Clinic Attending  Case discussed with Dr. Emokpae soon after the resident saw the patient.  We reviewed the resident's history and exam and pertinent patient test results.  I agree with the assessment, diagnosis, and plan of care documented in the resident's note. 

## 2014-03-14 NOTE — Addendum Note (Signed)
Addended by: Gilles Chiquito B on: 03/14/2014 09:08 AM   Modules accepted: Level of Service

## 2014-03-18 ENCOUNTER — Encounter: Payer: Medicare HMO | Admitting: Internal Medicine

## 2014-03-18 ENCOUNTER — Ambulatory Visit: Payer: Medicare HMO | Admitting: Dietician

## 2014-04-06 ENCOUNTER — Encounter: Payer: Self-pay | Admitting: Internal Medicine

## 2014-04-06 NOTE — Progress Notes (Signed)
Patient ID: [name], female   DOB: [birth year], 59 y.o.   MRN: [id number]  I am completing paperwork today from Medicare that requires my signature:  Received 03/21/14: authorization of [facility] orders for PT 03/07/14-05/05/14  As she recently suffered a CVA, I think she will benefit from this therapy.

## 2014-05-23 ENCOUNTER — Ambulatory Visit: Payer: Self-pay | Admitting: Neurology

## 2014-06-20 ENCOUNTER — Telehealth: Payer: Self-pay | Admitting: *Deleted

## 2014-06-20 NOTE — Telephone Encounter (Signed)
Pt left a message on triage recording about refill BP med. Left message on ID recording 11:30AM and 1:50PM - clinic called and to return our call. Hilda Blades Sapir Lavey RN 06/20/14 4:35PM

## 2014-06-29 ENCOUNTER — Encounter: Payer: Self-pay | Admitting: Internal Medicine

## 2014-06-29 NOTE — Progress Notes (Signed)
Patient ID: [name], female   DOB: November 26, 1955, 58 y.o.   MRN: [id number]  I am completing paperwork today from [employer] that requires my signature:  Received 06/27/14: authorization for her diabetic testing supplies

## 2014-08-26 ENCOUNTER — Encounter: Payer: Self-pay | Admitting: Internal Medicine

## 2014-08-26 ENCOUNTER — Ambulatory Visit (INDEPENDENT_AMBULATORY_CARE_PROVIDER_SITE_OTHER): Payer: Medicare HMO | Admitting: Internal Medicine

## 2014-08-26 VITALS — BP 150/80 | HR 70 | Temp 97.6°F | Ht 62.0 in | Wt 244.5 lb

## 2014-08-26 DIAGNOSIS — M170 Bilateral primary osteoarthritis of knee: Secondary | ICD-10-CM

## 2014-08-26 DIAGNOSIS — Z6841 Body Mass Index (BMI) 40.0 and over, adult: Secondary | ICD-10-CM

## 2014-08-26 DIAGNOSIS — Z8673 Personal history of transient ischemic attack (TIA), and cerebral infarction without residual deficits: Secondary | ICD-10-CM

## 2014-08-26 DIAGNOSIS — Z1231 Encounter for screening mammogram for malignant neoplasm of breast: Secondary | ICD-10-CM

## 2014-08-26 DIAGNOSIS — I10 Essential (primary) hypertension: Secondary | ICD-10-CM

## 2014-08-26 DIAGNOSIS — E119 Type 2 diabetes mellitus without complications: Secondary | ICD-10-CM

## 2014-08-26 DIAGNOSIS — G459 Transient cerebral ischemic attack, unspecified: Secondary | ICD-10-CM

## 2014-08-26 LAB — POCT GLYCOSYLATED HEMOGLOBIN (HGB A1C): HEMOGLOBIN A1C: 6.5

## 2014-08-26 LAB — GLUCOSE, CAPILLARY: Glucose-Capillary: 119 mg/dL — ABNORMAL HIGH (ref 65–99)

## 2014-08-26 MED ORDER — ATORVASTATIN CALCIUM 80 MG PO TABS: 80.0000 mg | ORAL_TABLET | Freq: Every day | ORAL | Status: DC

## 2014-08-26 MED ORDER — DICLOFENAC SODIUM 1 % TD GEL: 2.0000 g | Freq: Four times a day (QID) | TRANSDERMAL | Status: DC

## 2014-08-26 NOTE — Patient Instructions (Signed)
For your knee pain, please apply Voltaren gel to both knees and take Tylenol [phone] mg every 6 hours as needed for pain.   If the gel does not work on her knees, please give [place] a call back.  Please bring your medicines with you each time you come to clinic.  Medicines may include prescription medications, over-the-counter medications, herbal remedies, eye drops, vitamins, or other pills.   Progress Toward Treatment Goals:  Treatment Goal 12/17/2013  Hemoglobin A1C unchanged  Blood pressure at goal    Self Care Goals & Plans:  Self Care Goal 03/11/2014  Manage my medications take my medicines as prescribed; bring my medications to every visit; refill my medications on time  Monitor my health keep track of my blood glucose; bring my glucose meter and log to each visit  Eat healthy foods eat foods that are low in salt; eat more vegetables; eat baked foods instead of fried foods  Be physically active take a walk every day    Home Blood Glucose Monitoring 12/17/2013  Check my blood sugar no home glucose monitoring  When to check my blood sugar -     Care Management & Community Referrals:  Referral 12/17/2013  Referrals made for care management support diabetes educator

## 2014-08-26 NOTE — Progress Notes (Signed)
   Subjective:    Patient ID: [name], female    DOB: 09-Mar-1956, 59 y.o.   MRN: [id number]  HPI [name] is a 59 year old female with history of HTN, DM type 2, OA, HLD, depression who presents for a routine clinic visit.   Please see assessment & plan for documentation of each problem.  Review of Systems  Respiratory: Negative for shortness of breath.   Cardiovascular: Negative for chest pain and leg swelling.  Gastrointestinal: Negative for nausea, vomiting, abdominal pain, diarrhea and blood in stool.  Genitourinary: Negative for dysuria.  Neurological: Negative for dizziness.       Objective:   Physical Exam Constitutional: Obese African-American female. She is oriented to person, place, and time. No distress.  HENT:  Head: Normocephalic and atraumatic.  Eyes: Conjunctivae are normal. Pupils are equal, round, and reactive to light.  Cardiovascular: Normal rate, regular rhythm and normal heart sounds.  Exam reveals no gallop and no friction rub.   No murmur heard. Pulmonary/Chest: Effort normal. No respiratory distress. She has no wheezes. She has no rales.  Abdominal: Soft. Bowel sounds are normal. Obese abdomen. She exhibits no distension. There is no tenderness.  Neurological: She is alert and oriented to person, place, and time. Responds to questions appropriately.  Skin: Skin is warm and dry. She is not diaphoretic.  Psychiatric: Her behavior is normal.         Assessment & Plan:

## 2014-08-28 NOTE — Assessment & Plan Note (Signed)
She denies any new symptoms since her event back in December. We discussed risk factor management as outlined in the other plans.

## 2014-08-28 NOTE — Assessment & Plan Note (Addendum)
Lab Results  Component Value Date   HGBA1C 6.5 08/26/2014   HGBA1C 6.6* 03/03/2014   HGBA1C 6.5 12/17/2013     Assessment: Diabetes control: good control (HgbA1C at goal) Progress toward A1C goal:  improved Comments: Mostly unchanged. Given her recent TIA/CVA, she was advised that it is important to reduce her risk for a subsequent event by controlling her blood pressure and sugar. Not open to weight loss.  Plan: Medications:  None Home glucose monitoring: Frequency: no home glucose monitoring Timing: N/A Instruction/counseling given: discussed the need for weight loss Educational resources provided:   Self management tools provided:   Other plans: Continue reiterating at follow-up -Refilled Lipitor for 30 day supply but will send in for more refills to her mail order pharmacy once we have the information

## 2014-08-28 NOTE — Assessment & Plan Note (Addendum)
Overview She reports difficulty with getting out of chair and walking up a flight of stairs. She requested that we try a different medication though we discussed past treatment modalities. She reported no improvement with tramadol and some improvement with Tylenol 500 mg 2 every 8 hours and NSAIDs but wanted something better. She also reported having been to sports medicine in the past and having tried steroid injections to which she was not open to trying. She feels that she is fairly active and does not "sitting around the house." She also reports eating ample fruits and vegetables though tells me that she only has one bowel movement per week.  Assessment Bilateral knee osteoarthritis as noted on prior imaging  Plan -Advised her to try taking Tylenol 500 mg 1-2 tablets every 6 hours as needed for pain along with using Voltaren gel -Discussed weight loss with possible referral to health coach though she was not agreeable -Discussed better nutrition as constipation is likely from low fiber in her diet though she did not acknowledge that she had room to improve -Consider reimaging her knee with x-rays should her pain worsen

## 2014-08-28 NOTE — Assessment & Plan Note (Signed)
BP Readings from Last 3 Encounters:  08/26/14 150/80  03/11/14 139/79  03/05/14 118/72    Lab Results  Component Value Date   NA 140 03/03/2014   K 3.8 03/03/2014   CREATININE 0.73 03/03/2014    Assessment: Blood pressure control: mildly elevated Progress toward BP goal:  at goal Comments: Isolated value though no time available for her to have blood pressure rechecked.  Plan: Medications:  No medications Educational resources provided:   Self management tools provided:   Other plans: Recheck at follow-up and consider restarting antihypertensive therapy given her prior TIA/CVA

## 2014-08-28 NOTE — Assessment & Plan Note (Addendum)
As she was due for mammogram, she is agreeable to following up.  ADDENDUM 10/04/2014  6:41 PM:  Results reassuring. Will continue monitoring.

## 2014-08-31 NOTE — Progress Notes (Signed)
Internal Medicine Clinic Attending  Case discussed with Dr. Patel at the time of the visit.  We reviewed the resident's history and exam and pertinent patient test results.  I agree with the assessment, diagnosis, and plan of care documented in the resident's note.  

## 2014-09-22 ENCOUNTER — Ambulatory Visit (HOSPITAL_COMMUNITY)
Admission: RE | Admit: 2014-09-22 | Discharge: 2014-09-22 | Disposition: A | Discharge status: Home / Self Care | Payer: Medicare HMO | Source: Ambulatory Visit | Attending: Internal Medicine | Admitting: Internal Medicine

## 2014-09-22 DIAGNOSIS — Z1231 Encounter for screening mammogram for malignant neoplasm of breast: Secondary | ICD-10-CM | POA: Diagnosis present

## 2014-10-04 NOTE — Addendum Note (Signed)
Addended by: Riccardo Dubin on: 10/04/2014 06:41 PM   Modules accepted: Miquel Dunn

## 2015-01-30 ENCOUNTER — Encounter: Payer: Self-pay | Admitting: Internal Medicine

## 2015-01-30 ENCOUNTER — Ambulatory Visit: Payer: Medicare HMO | Admitting: Internal Medicine

## 2015-02-03 ENCOUNTER — Encounter: Payer: Self-pay | Admitting: Internal Medicine

## 2015-02-03 ENCOUNTER — Ambulatory Visit (INDEPENDENT_AMBULATORY_CARE_PROVIDER_SITE_OTHER): Payer: Medicare HMO | Admitting: Internal Medicine

## 2015-02-03 VITALS — BP 140/79 | HR 77 | Temp 98.3°F | Ht 62.0 in | Wt 237.4 lb

## 2015-02-03 DIAGNOSIS — K644 Residual hemorrhoidal skin tags: Secondary | ICD-10-CM

## 2015-02-03 DIAGNOSIS — E119 Type 2 diabetes mellitus without complications: Secondary | ICD-10-CM

## 2015-02-03 DIAGNOSIS — K5900 Constipation, unspecified: Secondary | ICD-10-CM | POA: Insufficient documentation

## 2015-02-03 DIAGNOSIS — R1032 Left lower quadrant pain: Secondary | ICD-10-CM | POA: Diagnosis not present

## 2015-02-03 DIAGNOSIS — K648 Other hemorrhoids: Secondary | ICD-10-CM

## 2015-02-03 LAB — POCT URINALYSIS DIPSTICK
Bilirubin, UA: NEGATIVE
Glucose, UA: NEGATIVE
KETONES UA: NEGATIVE
Nitrite, UA: NEGATIVE
PH UA: 6
PROTEIN UA: NEGATIVE
Spec Grav, UA: 1.025
Urobilinogen, UA: 0.2

## 2015-02-03 LAB — HM DIABETES EYE EXAM

## 2015-02-03 LAB — GLUCOSE, CAPILLARY: Glucose-Capillary: 109 mg/dL — ABNORMAL HIGH (ref 65–99)

## 2015-02-03 LAB — POCT GLYCOSYLATED HEMOGLOBIN (HGB A1C): HEMOGLOBIN A1C: 6.2

## 2015-02-03 MED ORDER — LIDOCAINE-HYDROCORTISONE ACE 3-0.5 % RE CREA: 1.0000 | TOPICAL_CREAM | Freq: Two times a day (BID) | RECTAL | Status: DC

## 2015-02-03 MED ORDER — SENNOSIDES-DOCUSATE SODIUM 8.6-50 MG PO TABS: 2.0000 | ORAL_TABLET | Freq: Every day | ORAL | Status: DC | PRN

## 2015-02-03 NOTE — Assessment & Plan Note (Signed)
Likely secondary to constipation. Differential includes ovarian malignancy, diverticulitis. Urinalysis showed trace blood and no infection, doubt urinary tract infection or nephrolithiasis.Will treat for constipation with Senokot S. Patient was given return instructions if [name] develops a fever, chills, nausea, vomiting, diarrhea, or severe pain. Also instructed to return to the office if [name] has persistent bloating and weight loss.  Plan -Senokot S2 tabs QD PRN -continue home metamucil

## 2015-02-03 NOTE — Assessment & Plan Note (Signed)
Hemorrhoids are small on examination, no evidence of bleeding or thrombosis. Likely exacerbated by constipation and straining.  Plan -prescribe lidocaine and steroid cream top a call twice a day as needed -Senokot S for constipation

## 2015-02-03 NOTE — Patient Instructions (Signed)
USE THE NEW CREAM ON YOUR HEMORRHOIDS TWICE A DAY.  TAKE SENOKOT-S 2 PILLS ONCE A DAY FOR YOUR CONSTIPATION. IF YOU DEVELOP FEVER, CHILLS, NAUSEA, VOMITING, WORSENING ABDOMINAL PAIN, PERSISTENT BLOATING, RETURN TO OUR OFFICE.

## 2015-02-03 NOTE — Progress Notes (Signed)
Subjective:    Patient ID: [name], female    DOB: 1955-06-29, 59 y.o.   MRN: [id number]  HPI [name] is a 59 y.o. female with PMHx of HTN, HLD, T2DM who presents to the clinic for complaint of left lower quadrant and hemorrhoid pain. Please see A&P for the status of the patient's chronic medical problems.   On Monday, patient developed low back pain with radiation to her her LLQ. Pain was achy, dull and intermittent. It is associated with constipation (decreased number of stools, one per week, and smaller stools). She denies any fevers, chills, nausea, vomiting, or diarrhea. She denies any urinary symptoms of dysuria, increased frequency, or urgency. Pain does not radiate to her crying. It is not relieved by bowel movements. She has a normal appetite and no increased pain when she eats. She does have associated bloating and fullness. She denies any blood in her stool, black tarry stool's, or unexpected weight loss. Her last colonoscopy was normal in two thousand nine. Pathology was normal. Repeat colonoscopy due in 2019. She denies any vaginal bleeding. Pain at its worst was a 10 out of 10, but pain is now a five out of 10. She has tried Tylenol and advil for her pain with little relief. For her likely constipation she has tried metamucil without relief.   Patient states that her increased straining has worsened her hemorrhoid pain. She uses preparation H without much relief. Last colonoscopy showed medium internal and external hemorrhoids. She denies bleeding.   Past Medical History  Diagnosis Date  . Hypertension   . Hyperlipidemia   . Diabetes mellitus   . Hyperplastic colon polyp 12-2007    Dr. Ardis Hughs  . Internal and external hemorrhoids without complication 0/63/0160    Outpatient Encounter Prescriptions as of 02/03/2015  Medication Sig  . aspirin 81 MG tablet Take 1 tablet (81 mg total) by mouth daily.  Marland Kitchen atorvastatin (LIPITOR) 80 MG tablet Take 1 tablet (80 mg total) by  mouth daily at 6 PM.  . diclofenac sodium (VOLTAREN) 1 % GEL Apply 2 g topically 4 (four) times daily.  Marland Kitchen lidocaine-hydrocortisone (ANAMANTEL HC) 3-0.5 % CREA Place 1 Applicatorful rectally 2 (two) times daily.  Marland Kitchen senna-docusate (SENOKOT-S) 8.6-50 MG tablet Take 2 tablets by mouth daily as needed for mild constipation.   No facility-administered encounter medications on file as of 02/03/2015.    Family History  Problem Relation Age of Onset  . Stroke Mother     age 79  . Heart attack Father     age 77  . Heart attack Sister     age 53  . Colon cancer      Has an aunt that died of Colon CA (late 85's-early 60's)    Social History   Social History  . Marital Status: Married    Spouse Name: N/A  . Number of Children: N/A  . Years of Education: 11   Occupational History  . Unemployed    Social History Main Topics  . Smoking status: Never Smoker   . Smokeless tobacco: Not on file  . Alcohol Use: No  . Drug Use: No  . Sexual Activity: Not on file     Comment: with seperated husband   Other Topics Concern  . Not on file   Social History Narrative   Lives at home with son.  Separated from husband- still sexually active with him    Review of Systems General: Denies fever, chills, fatigue,  change in appetite  Respiratory: Denies SOB, cough Cardiovascular: Denies chest pain and palpitations.  Gastrointestinal: admits to abdominal pain in left lower quadrant. Denies nausea, vomiting, diarrhea, constipation, blood in stool and abdominal distention.  Genitourinary: Denies dysuria, urgency, frequency, hematuria, suprapubic pain and flank pain. Musculoskeletal: admits to low back pain, improved. Denies myalgias Neurological: Denies dizziness, headaches, weakness, lightheadedness      Objective:   Physical Exam Filed Vitals:   02/03/15 1545  BP: 140/79  Pulse: 77  Temp: 98.3 F (36.8 C)  TempSrc: Oral  Austin: 5\' 2"  (1.575 m)  Weight: 237 lb 6.4 oz (107.684 kg)    SpO2: 100%   General: Vital signs reviewed.  Patient is well-developed and well-nourished, in no acute distress and cooperative with exam.   Cardiovascular: RRR, S1 normal, S2 normal Pulmonary/Chest: Clear to auscultation bilaterally, no wheezes, rales, or rhonchi. Abdominal: Soft, obese, non-tender, non-distended, BS +, no masses, organomegaly, or guarding present. No flank pain or CVA tenderness. Musculoskeletal: No joint deformities, erythema, or stiffness, ROM full and nontender. Extremities: No lower extremity edema bilaterally Psychiatric: Normal mood and affect. speech and behavior is normal. Cognition and memory are normal.      Assessment & Plan:    Please see a problem-based assessment and plan.

## 2015-02-05 NOTE — Progress Notes (Signed)
Internal Medicine Clinic Attending  Case discussed with Dr. Richardson soon after the resident saw the patient.  We reviewed the resident's history and exam and pertinent patient test results.  I agree with the assessment, diagnosis, and plan of care documented in the resident's note. 

## 2015-02-06 ENCOUNTER — Telehealth: Payer: Self-pay | Admitting: Internal Medicine

## 2015-02-06 MED ORDER — WITCH HAZEL-GLYCERIN EX PADS: 1.0000 "application " | MEDICATED_PAD | CUTANEOUS | Status: DC | PRN

## 2015-02-06 NOTE — Telephone Encounter (Signed)
Spoke with pharmacy, the anamantel [facility] 7g is not covered by insurance, costs over $200 out of pocket.   Is there another Rx strength to be ordered?

## 2015-02-06 NOTE — Telephone Encounter (Signed)
Hmm. I don't see any insurance on her banner so not sure what her coverage is like.  In terms of OTC meds, we can try Tucks pads. Please advise her to take her Senokot-S as well to help reduce the burden on her hemorrhoids.

## 2015-02-06 NOTE — Telephone Encounter (Signed)
Pt states she can not afford the hemorrhoid cream. Please call pt back.

## 2015-02-07 NOTE — Telephone Encounter (Signed)
Spoke with patient, advised that Tucks pads have been called in, she may also find them OTC if that is a better option financially.  Also advised of reminder from Dr. Posey Pronto to take Senokot.  Pt reports she got the tabs yesterday and understands their use.  She will call us back if needed.

## 2015-02-15 ENCOUNTER — Encounter: Payer: Self-pay | Admitting: Dietician

## 2015-03-03 ENCOUNTER — Encounter: Payer: Self-pay | Admitting: Internal Medicine

## 2015-03-03 DIAGNOSIS — E119 Type 2 diabetes mellitus without complications: Secondary | ICD-10-CM

## 2015-03-03 NOTE — Progress Notes (Signed)
Patient ID: [name], female   DOB: [birth year], 59 y.o.   MRN: [id number]  I have reviewed the office notes from Dr. Melida Gimenez [ophthalmology]  for the retinal scan dated 02/03/15:  No diabetic retinopathy noted in either eye with plan to follow-up photographs in 12 months.  I will reiterate these updates to the patient at their next scheduled visit.

## 2015-07-21 ENCOUNTER — Encounter: Payer: Self-pay | Admitting: Internal Medicine

## 2015-07-21 ENCOUNTER — Ambulatory Visit (INDEPENDENT_AMBULATORY_CARE_PROVIDER_SITE_OTHER): Payer: Medicare HMO | Admitting: Internal Medicine

## 2015-07-21 VITALS — BP 140/84 | HR 79 | Temp 98.2°F | Ht 62.0 in | Wt 243.7 lb

## 2015-07-21 DIAGNOSIS — J208 Acute bronchitis due to other specified organisms: Secondary | ICD-10-CM

## 2015-07-21 DIAGNOSIS — R252 Cramp and spasm: Secondary | ICD-10-CM | POA: Diagnosis not present

## 2015-07-21 DIAGNOSIS — Z8673 Personal history of transient ischemic attack (TIA), and cerebral infarction without residual deficits: Secondary | ICD-10-CM

## 2015-07-21 DIAGNOSIS — G459 Transient cerebral ischemic attack, unspecified: Secondary | ICD-10-CM

## 2015-07-21 DIAGNOSIS — M170 Bilateral primary osteoarthritis of knee: Secondary | ICD-10-CM

## 2015-07-21 LAB — GLUCOSE, CAPILLARY: GLUCOSE-CAPILLARY: 151 mg/dL — AB (ref 65–99)

## 2015-07-21 MED ORDER — ALBUTEROL SULFATE HFA 108 (90 BASE) MCG/ACT IN AERS: 2.0000 | INHALATION_SPRAY | Freq: Four times a day (QID) | RESPIRATORY_TRACT | Status: DC | PRN

## 2015-07-21 NOTE — Assessment & Plan Note (Signed)
Overview She has not been taking aspirin 81 mg due to cost.  Assessment History of TIA at risk for secondary stroke given that she is not on maximum medical therapy.  Plan Provided her with a sample of aspirin 81 mg 90 tablets from the clinic and strongly encouraged her to continue taking his medication to which she acknowledged understanding

## 2015-07-21 NOTE — Assessment & Plan Note (Signed)
Overview Today, she presents with a seven-day history of cough productive of yellow sputum.she does a knowledge that several family members in her household have been sick, including her son, as well as neighbors. Though she acknowledges chills, she denies fever, nausea, vomiting, diarrhea, ear pain, tobacco use, inhaler use, sore throat.  Assessment Acute viral bronchitis. Symptoms may last up to 3 weeks, so I do not feel inclined to treat with antibiotic therapy.she is also limited by the fact that she does not have money to purchase medications  Plan -Gave her albuterol  Inhaler sample along with spacer and demonstrated proper technique with instructions to take 1-2 puffs every 4-6 hours as needed to help with mucociliary clearance -Provide her with a sample of aromatherapy to help with opening her nasal passages

## 2015-07-21 NOTE — Assessment & Plan Note (Addendum)
Overview About 7 days ago, she missed a step while coming down stairs and has since been suffering from a cramping of her left calf muscle. When she leans into her left hip while sitting in a chair,  Her pain worsens. When she leans into her right hip while sitting in his chair, her pain improves. She has been soaking her muscle and taking ibuprofen 200-400 mg every 8-12 hours as needed with some improvement in her pain. Overall, she feels her pain has been improving.  Assessment Muscular strain. Physical exam was unremarkable for any deficits in strength testing  She had 5 out of 5 hip flexion/extension as well as knee flexion/extension.  Plan -Advised her to continue conservative therapy -Recommended she take up to ibuprofen 600-800mg  no more than 8-12 hours at a time as well for pain

## 2015-07-21 NOTE — Progress Notes (Signed)
   Subjective:    Patient ID: [name], female    DOB: [birth year], 60 y.o.   MRN: [id number]  HPI Ms. Balbi is a 60 year old female with history of hypertension, diet controlled type 2 diabetes, osteoarthritis, hyperlipidemia history of TIA who presents today for evaluation of her acute respiratory illness. Please see assessment & plan for documentation of chronic medical problems.  Of note, she has been out of her medications and will not be able to purchase any refills until early next month when she has her next paycheck.   Review of Systems  Constitutional: Positive for fever. Negative for chills.  HENT: Negative for sore throat.   Respiratory: Positive for cough.   Gastrointestinal: Negative for nausea, vomiting and diarrhea.       Objective:   Physical Exam  Constitutional: She is oriented to person, place, and time. She appears well-developed and well-nourished. No distress.  Middle-aged, African-American female  HENT:  Head: Normocephalic and atraumatic.  Mouth/Throat: No oropharyngeal exudate.  No tenderness to palpation of maxillary and frontal sinuses.  Eyes: Conjunctivae are normal. No scleral icterus.  Neck: No tracheal deviation present.  Cardiovascular: Normal rate and regular rhythm.  Exam reveals no gallop and no friction rub.   No murmur heard. Pulmonary/Chest: Effort normal and breath sounds normal. No stridor. No respiratory distress. She has no wheezes. She has no rales.  Neurological: She is alert and oriented to person, place, and time.  Skin: Skin is warm and dry. She is not diaphoretic.  Psychiatric: She has a normal mood and affect. Her behavior is normal.          Assessment & Plan:

## 2015-07-21 NOTE — Patient Instructions (Addendum)
Please make sure you're taking aspirin 81 mg every single day to prevent the chance of having another stroke  For your cold, try taking albuterol 1-2 puffs every 4-6 hours using the spacer.  For your calf pain, you can take ibuprofen 3-4 tablets every 8 hours as needed.  Please see me back in May so we can talk about other things related to your care.

## 2015-07-23 NOTE — Progress Notes (Signed)
Case discussed with Dr. Patel at the time of the visit.  We reviewed the resident's history and exam and pertinent patient test results.  I agree with the assessment, diagnosis and plan of care documented in the resident's note. 

## 2015-08-08 ENCOUNTER — Other Ambulatory Visit: Payer: Self-pay | Admitting: Internal Medicine

## 2015-08-22 ENCOUNTER — Ambulatory Visit (INDEPENDENT_AMBULATORY_CARE_PROVIDER_SITE_OTHER): Payer: Medicare HMO | Admitting: Internal Medicine

## 2015-08-22 ENCOUNTER — Encounter: Payer: Self-pay | Admitting: Internal Medicine

## 2015-08-22 VITALS — BP 152/83 | HR 62 | Temp 97.8°F | Ht 62.0 in | Wt 243.7 lb

## 2015-08-22 DIAGNOSIS — E785 Hyperlipidemia, unspecified: Secondary | ICD-10-CM

## 2015-08-22 DIAGNOSIS — E119 Type 2 diabetes mellitus without complications: Secondary | ICD-10-CM | POA: Diagnosis not present

## 2015-08-22 DIAGNOSIS — M76892 Other specified enthesopathies of left lower limb, excluding foot: Secondary | ICD-10-CM

## 2015-08-22 DIAGNOSIS — E1169 Type 2 diabetes mellitus with other specified complication: Secondary | ICD-10-CM | POA: Diagnosis not present

## 2015-08-22 DIAGNOSIS — M65852 Other synovitis and tenosynovitis, left thigh: Secondary | ICD-10-CM | POA: Diagnosis not present

## 2015-08-22 DIAGNOSIS — Z8673 Personal history of transient ischemic attack (TIA), and cerebral infarction without residual deficits: Secondary | ICD-10-CM

## 2015-08-22 DIAGNOSIS — E784 Other hyperlipidemia: Secondary | ICD-10-CM

## 2015-08-22 LAB — GLUCOSE, CAPILLARY: Glucose-Capillary: 119 mg/dL — ABNORMAL HIGH (ref 65–99)

## 2015-08-22 LAB — POCT GLYCOSYLATED HEMOGLOBIN (HGB A1C): Hemoglobin A1C: 7.2

## 2015-08-22 MED ORDER — ATORVASTATIN CALCIUM 40 MG PO TABS: 40.0000 mg | ORAL_TABLET | Freq: Every day | ORAL | Status: DC

## 2015-08-22 MED ORDER — IBUPROFEN 800 MG PO TABS: 800.0000 mg | ORAL_TABLET | Freq: Three times a day (TID) | ORAL | Status: DC | PRN

## 2015-08-22 NOTE — Assessment & Plan Note (Signed)
Overview For the last several months, she notes pain while sitting down the runs down the posterior left leg to calf. She describes this as a "throbbing" pain but denies any numbness/tingling associated with it. She has tried ibuprofen 400 mg a couple times over couple days with no relief. There is no change in pain while lying supine. She does not report any recent injury or accident which may have precipitated this pain.  Assessment Hamstring tendinitis of the left thigh. Another diagnostic consideration would be ischial bursitis that there is no associated radicular pain.  Plan -Prescribed ibuprofen 800 mg to be taken every 8 hours as needed with meals. Also recommended that she try 4 tablets of over-the-counter ibuprofen if she cannot afford this medication. -Reassess in one month and discuss weight loss given her other comorbidities

## 2015-08-22 NOTE — Progress Notes (Signed)
   Subjective:    Patient ID: [name], female    DOB: 08/15/1955, 60 y.o.   MRN: [id number]  HPI [name] is a 60 year old female with history of hypertension, diet controlled type 2 diabetes, osteoarthritis, hyperlipidemia history of TIA who presents today for evaluation of left thigh pain. Please see assessment & plan for documentation of chronic medical problems.   Review of Systems  Constitutional: Negative for appetite change.  Respiratory: Negative for shortness of breath.   Cardiovascular: Negative for chest pain.  Musculoskeletal: Positive for arthralgias. Negative for back pain and gait problem.  Neurological: Negative for numbness.       Objective:   Physical Exam  Constitutional: She is oriented to person, place, and time. She appears well-developed and well-nourished. No distress.  HENT:  Head: Normocephalic and atraumatic.  Eyes: Conjunctivae are normal. No scleral icterus.  Neck: No tracheal deviation present.  Cardiovascular: Normal rate and regular rhythm.  Exam reveals no gallop and no friction rub.   No murmur heard. Pulmonary/Chest: Effort normal and breath sounds normal. No stridor. No respiratory distress. She has no wheezes. She has no rales.  Musculoskeletal:  Range of motion limited by large body habitus. Minor pain noted with hip flexion/extension of left lower extremity though 4 out of 5. Point tenderness noted on the posterior left thigh. Hip abduction and adduction intact and did not reproduce pain. Bilateral leg extension/flexion 5 out of 5.   Neurological: She is alert and oriented to person, place, and time.  Skin: Skin is warm and dry. She is not diaphoretic.          Assessment & Plan:

## 2015-08-22 NOTE — Assessment & Plan Note (Addendum)
Overview She has not taken atorvastatin 80 mg due to cost. Per 2013 AHA/ACC ASCVD risk calculator, 10-year risk is 16.0 % and 3.0 % with optimal risk factors. High intensity statin therapy is thus indicated.  Assessment Hyperlipidemia associated with type 2 diabetes currently not on statin therapy  Plan -Prescribe atorvastatin 40 mg tablets. -Check CMET, CBC, lipid profile for baseline -Speak with the clinic pharmacy team to discuss which pharmacy would be the most affordable to her  ADDENDUM 08/23/2015  3:47 PM:  Per 2013 AHA/ACC ASCVD risk calculator, 10-year risk is 17.1 % and 3.0% with optimal risk factors. High intensity statin therapy is thus indicated.  CMET, CBC reassuring for no abnormalities.

## 2015-08-22 NOTE — Patient Instructions (Signed)
Please take ibuprofen 800mg  up to three times daily with meals. If you cannot afford the prescription, you can take 4 tablets.   Let's see each other back next month for pap smear and to review bloodwork.

## 2015-08-22 NOTE — Assessment & Plan Note (Addendum)
Overview She has not had an A1c checked since November 2016. Her values have mostly trended below 7 though given her history of TIA last year, she is due for recheck.  Assessment Non-insulin-dependent type 2 diabetes on no therapy with glycemic control at target of A1c less than 7.  Plan Recheck A1c today  ADDENDUM 08/22/2015  3:29 PM:  A1c 7.2. Will review this result with her at follow-up visit next month to discuss trial of lifestyle modification.

## 2015-08-23 LAB — CMP14 + ANION GAP
A/G RATIO: 1.7 (ref 1.2–2.2)
ALT: 28 IU/L (ref 0–32)
AST: 38 IU/L (ref 0–40)
Albumin: 4 g/dL (ref 3.6–4.8)
Alkaline Phosphatase: 50 IU/L (ref 39–117)
Anion Gap: 17 mmol/L (ref 10.0–18.0)
BILIRUBIN TOTAL: 0.3 mg/dL (ref 0.0–1.2)
BUN/Creatinine Ratio: 14 (ref 12–28)
BUN: 10 mg/dL (ref 8–27)
CALCIUM: 9.1 mg/dL (ref 8.7–10.3)
CHLORIDE: 101 mmol/L (ref 96–106)
CO2: 23 mmol/L (ref 18–29)
Creatinine, Ser: 0.73 mg/dL (ref 0.57–1.00)
GFR calc Af Amer: 104 mL/min/{1.73_m2} (ref >59)
GFR, EST NON AFRICAN AMERICAN: 90 mL/min/{1.73_m2} (ref >59)
GLUCOSE: 119 mg/dL — AB (ref 65–99)
Globulin, Total: 2.4 g/dL (ref 1.5–4.5)
POTASSIUM: 4.3 mmol/L (ref 3.5–5.2)
Sodium: 141 mmol/L (ref 134–144)
Total Protein: 6.4 g/dL (ref 6.0–8.5)

## 2015-08-23 LAB — LIPID PANEL
Chol/HDL Ratio: 3.3 ratio units (ref 0.0–4.4)
Cholesterol, Total: 178 mg/dL (ref 100–199)
HDL: 54 mg/dL (ref >39)
LDL CALC: 110 mg/dL — AB (ref 0–99)
Triglycerides: 69 mg/dL (ref 0–149)
VLDL CHOLESTEROL CAL: 14 mg/dL (ref 5–40)

## 2015-08-23 LAB — CBC
HEMOGLOBIN: 12.8 g/dL (ref 11.1–15.9)
Hematocrit: 38.1 % (ref 34.0–46.6)
MCH: 31 pg (ref 26.6–33.0)
MCHC: 33.6 g/dL (ref 31.5–35.7)
MCV: 92 fL (ref 79–97)
Platelets: 235 10*3/uL (ref 150–379)
RBC: 4.13 x10E6/uL (ref 3.77–5.28)
RDW: 14 % (ref 12.3–15.4)
WBC: 6.9 10*3/uL (ref 3.4–10.8)

## 2015-08-23 NOTE — Progress Notes (Signed)
Case discussed with Dr. Patel at the time of the visit.  We reviewed the resident's history and exam and pertinent patient test results.  I agree with the assessment, diagnosis, and plan of care documented in the resident's note. 

## 2015-09-29 ENCOUNTER — Ambulatory Visit (INDEPENDENT_AMBULATORY_CARE_PROVIDER_SITE_OTHER): Payer: Medicare HMO | Admitting: Internal Medicine

## 2015-09-29 ENCOUNTER — Other Ambulatory Visit (HOSPITAL_COMMUNITY)
Admission: RE | Admit: 2015-09-29 | Discharge: 2015-09-29 | Disposition: A | Discharge status: Home / Self Care | Payer: Medicare HMO | Source: Ambulatory Visit | Attending: Internal Medicine | Admitting: Internal Medicine

## 2015-09-29 ENCOUNTER — Encounter: Payer: Self-pay | Admitting: Internal Medicine

## 2015-09-29 VITALS — BP 135/83 | HR 88 | Temp 98.2°F | Wt 240.9 lb

## 2015-09-29 DIAGNOSIS — Z01411 Encounter for gynecological examination (general) (routine) with abnormal findings: Secondary | ICD-10-CM | POA: Insufficient documentation

## 2015-09-29 DIAGNOSIS — Z6841 Body Mass Index (BMI) 40.0 and over, adult: Secondary | ICD-10-CM | POA: Diagnosis not present

## 2015-09-29 DIAGNOSIS — M65852 Other synovitis and tenosynovitis, left thigh: Secondary | ICD-10-CM

## 2015-09-29 DIAGNOSIS — Z1151 Encounter for screening for human papillomavirus (HPV): Secondary | ICD-10-CM | POA: Diagnosis present

## 2015-09-29 DIAGNOSIS — M76892 Other specified enthesopathies of left lower limb, excluding foot: Secondary | ICD-10-CM

## 2015-09-29 DIAGNOSIS — R87810 Cervical high risk human papillomavirus (HPV) DNA test positive: Secondary | ICD-10-CM | POA: Insufficient documentation

## 2015-09-29 DIAGNOSIS — Z124 Encounter for screening for malignant neoplasm of cervix: Secondary | ICD-10-CM | POA: Diagnosis not present

## 2015-09-29 DIAGNOSIS — E1165 Type 2 diabetes mellitus with hyperglycemia: Secondary | ICD-10-CM | POA: Diagnosis not present

## 2015-09-29 DIAGNOSIS — E119 Type 2 diabetes mellitus without complications: Secondary | ICD-10-CM

## 2015-09-29 MED ORDER — METFORMIN HCL ER 500 MG PO TB24: 500.0000 mg | ORAL_TABLET | Freq: Every day | ORAL | Status: DC

## 2015-09-29 MED ORDER — IBUPROFEN 800 MG PO TABS: 800.0000 mg | ORAL_TABLET | Freq: Three times a day (TID) | ORAL | Status: DC | PRN

## 2015-09-29 NOTE — Assessment & Plan Note (Addendum)
Assessment She is overdue for pap smear.  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 Last Pap smear in 2012 had reassuring cytology but no HPV testing was done at that time. Spoke with Dr. Nehemiah Settle [Gynecology] who recommended co-testing in one year per ASCCP guidelines.   Attempted to call patient to review results but left voicemail to call us back.

## 2015-09-29 NOTE — Assessment & Plan Note (Signed)
Assessment Since her last visit, she acknowledges improvement in her pain from 5/10 with ibuprofen 800 mg. She has taken this no more than 1 time a day either in the morning with breakfast or at dinner before bedtime. She would like this pain to resolve altogether.  Plan -Refer to physical therapy for strengthening exercises -Refilled ibuprofen 800 mg to be taken every 8 hours as needed -Advised weight loss strategies

## 2015-09-29 NOTE — Patient Instructions (Signed)
For your diabetes, I have sent a prescription for metformin XR to be taken once daily.   We will work on getting you over to physical therapy.  Please see me back in 4 weeks.

## 2015-09-29 NOTE — Assessment & Plan Note (Signed)
Assessment I reviewed with her that her A1c had increased to 7.2 when checked at her last visit. She does acknowledge symptoms of hyperglycemia, like blurry vision, increased thirst, urinary frequency, decreased energy. She is open to pharmacologic therapy though would also like to make lifestyle modifications.  Plan -Prescribed metformin XR 500 mg to be taken daily with breakfast given the adverse effects of diarrhea she noted with standard metformin therapy -Provided her with a handout on meal planning encouraged her to limit starchy foods -Follow-up in 1 month to assess response to therapy

## 2015-09-29 NOTE — Assessment & Plan Note (Signed)
Assessment She would like to try losing weight to 8 her with her tendinitis and diabetes. She does report drinking caffeinated beverages, mostly Sprite and [facility], one can at a time. She also drinks sweet tea though cannot quantify how much. She denies drinking sugary beverages. She loves potatoes and tries to limit her intake of them.  Plan -Provided her with a meal planing handout which goes over the plate method -Referred to diabetic educator -Set goal of 10% of her current weight which would be roughly 25 lbs

## 2015-09-29 NOTE — Progress Notes (Signed)
   Subjective:    Patient ID: [name], female    DOB: [birth year], 60 y.o.   MRN: [id number]  HPI [name] is a 60 year old female who presents today for knee pain. Please see assessment & plan for status of chronic medical problems.  Review of Systems     Objective:   Physical Exam  Constitutional: She is oriented to person, place, and time. She appears well-developed and well-nourished. No distress.  HENT:  Head: Normocephalic and atraumatic.  Eyes: Conjunctivae are normal. No scleral icterus.  Neck: No tracheal deviation present.  Pulmonary/Chest: Effort normal. No stridor. No respiratory distress.  Genitourinary: No vaginal discharge found.  Cervical vault without lesions noted. Physiologic discharge noted in the vaginal vault.  Neurological: She is alert and oriented to person, place, and time.  Skin: Skin is warm and dry. She is not diaphoretic.          Assessment & Plan:

## 2015-10-02 LAB — CYTOLOGY - PAP

## 2015-10-02 NOTE — Progress Notes (Signed)
Internal Medicine Clinic Attending  Case discussed with Dr. Patel,Rushil at the time of the visit.  We reviewed the resident's history and exam and pertinent patient test results.  I agree with the assessment, diagnosis, and plan of care documented in the resident's note.  

## 2015-10-16 ENCOUNTER — Ambulatory Visit: Payer: Medicare HMO | Attending: Internal Medicine

## 2015-10-16 NOTE — Addendum Note (Signed)
Addended by: Hulan Fray on: 10/16/2015 08:19 PM   Modules accepted: Orders

## 2015-10-26 ENCOUNTER — Ambulatory Visit: Payer: Medicare HMO | Admitting: Dietician

## 2015-10-26 ENCOUNTER — Encounter: Payer: Self-pay | Admitting: Internal Medicine

## 2015-10-26 ENCOUNTER — Ambulatory Visit: Payer: Medicare HMO

## 2015-10-30 ENCOUNTER — Telehealth: Payer: Self-pay | Admitting: Dietician

## 2015-10-30 NOTE — Addendum Note (Signed)
Addended by: Yvonna Alanis E on: 10/30/2015 10:50 PM   Modules accepted: Orders

## 2015-10-30 NOTE — Telephone Encounter (Signed)
Called patient to discuss message from the front office that patient did not want to schedule a covisit with a dietitian.  She says she thought she'd have to go somewhere else ( has trouble with transportation) and it would cost her money.  I explained that we'd schedule on the same day and place she sees her doctor and it is covered 100% by Medicare.   She agreed to a visit same day she sees her doctor. I notified her that it would have to be her next visit with our office as I am out on the day of her next visit.

## 2015-10-31 ENCOUNTER — Encounter: Payer: Self-pay | Admitting: Dietician

## 2015-10-31 ENCOUNTER — Ambulatory Visit (INDEPENDENT_AMBULATORY_CARE_PROVIDER_SITE_OTHER): Payer: Medicare HMO | Admitting: Dietician

## 2015-10-31 DIAGNOSIS — Z7984 Long term (current) use of oral hypoglycemic drugs: Secondary | ICD-10-CM

## 2015-10-31 DIAGNOSIS — Z713 Dietary counseling and surveillance: Secondary | ICD-10-CM

## 2015-10-31 DIAGNOSIS — E119 Type 2 diabetes mellitus without complications: Secondary | ICD-10-CM

## 2015-10-31 DIAGNOSIS — Z6841 Body Mass Index (BMI) 40.0 and over, adult: Secondary | ICD-10-CM

## 2015-10-31 NOTE — Progress Notes (Signed)
  Medical Nutrition Therapy:  Appt start time: 0910 end time:  1000. Visit # 1  Assessment:  Primary concerns today: diabetes meal planning.  Ms. Foglia was referred for and increasing A1C to assist her with making diet change that can decrease the A1C back to the ^s. She wants to know how to tone the back of her arms. She feels she is doing well with meal planning. She eats at least one balanced meal a day. Her food intake reported includes many sources of fat and processed grains. Encouraged her to decrease her weight to ~ 230# by continuing healthy food and physical activity changes. Despite explanation that usually 3-4 visits in 6 months is optimal for behavior change, she did not want to schedule additional visits today.   Preferred Learning Style: No preference indicated  Learning Readiness: Ready and Change in progress  ANTHROPOMETRICS: weight-240.9#, height-62", BMI- 44 WEIGHT HISTORY:reports she used to be 300# and decided to stop overeating.  SLEEP:not addressed today MEDICATIONS: 500 mg metformin daily Feet: are in great shape- wears tennis shoes to walk BLOOD SUGAR:most recent A1C 7.2% increased from 6.2% and 6.5% DIETARY INTAKE: Usual eating pattern includes 2 meals and 1-2 snacks per day. Everyday foods include water, juice, fried foods, whole milk.  Avoided foods include yogurt   24-hr recall:  B ( AM): skips oftenwater or orange juice, sometimes fried egg, [place] bacon and 1 slice toast  L ( PM):  TV dinner, whole milk Snk ( PM): chips  D ( PM): out 1-2 x/week- [employer] or [employer]. Only gets fried meat, potatoes and a vegetable, at home makes chicken with soup, onions and peppers, white rice Snk ( PM): watermelon Beverages: clear regular soda about 1x/week, water, juice  Usual physical activity: walks as much as she can and as often as she can, 20-30 minutes a few times a week, asked for tricep exercise to tone the back of her arms.   Estimated daily energy needs for  weight loss of 1/2 -1#/week: 1300-1600 calories 150-160 g carbohydrates 45-50 g fat/day   Progress Towards Goal(s):  In progress.   Nutritional Diagnosis:  NB-1.1 Food and nutrition-related knowledge deficit As related to lack of sufficient prior diabetes meal planning.  As evidenced by her report.    Intervention:  Nutrition education about reason for visit, A1C levels and how to decrease the A1C. Provided a cooking demonstration and education about options for decreasing the fat.in her diet and increasing nutrient density if she desires. Also educated on tricep muscle exercise to tone the back of her arm.  Coordination of care: none at this time  Teaching Method Utilized: Visual,Auditory,Hands on Handouts given during visit include: Physical activity picture book for seniors, living well with diabetes  Barriers to learning/adherence to lifestyle change: transportation, competing values Demonstrated degree of understanding via:  Teach Back   Monitoring/Evaluation:  Dietary intake, exercise, and body weight prn (per patient request) .

## 2015-10-31 NOTE — Telephone Encounter (Signed)
Thank you for following up.

## 2015-11-24 ENCOUNTER — Ambulatory Visit (INDEPENDENT_AMBULATORY_CARE_PROVIDER_SITE_OTHER): Payer: Medicare HMO | Admitting: Internal Medicine

## 2015-11-24 ENCOUNTER — Encounter: Payer: Self-pay | Admitting: Internal Medicine

## 2015-11-24 VITALS — BP 147/79 | HR 69 | Temp 97.6°F | Ht 62.0 in | Wt 244.6 lb

## 2015-11-24 DIAGNOSIS — Z8673 Personal history of transient ischemic attack (TIA), and cerebral infarction without residual deficits: Secondary | ICD-10-CM

## 2015-11-24 DIAGNOSIS — Z7982 Long term (current) use of aspirin: Secondary | ICD-10-CM

## 2015-11-24 DIAGNOSIS — Z23 Encounter for immunization: Secondary | ICD-10-CM

## 2015-11-24 DIAGNOSIS — E119 Type 2 diabetes mellitus without complications: Secondary | ICD-10-CM

## 2015-11-24 DIAGNOSIS — R252 Cramp and spasm: Secondary | ICD-10-CM

## 2015-11-24 DIAGNOSIS — M76892 Other specified enthesopathies of left lower limb, excluding foot: Secondary | ICD-10-CM

## 2015-11-24 DIAGNOSIS — Z7984 Long term (current) use of oral hypoglycemic drugs: Secondary | ICD-10-CM

## 2015-11-24 DIAGNOSIS — Z79899 Other long term (current) drug therapy: Secondary | ICD-10-CM | POA: Diagnosis not present

## 2015-11-24 DIAGNOSIS — M170 Bilateral primary osteoarthritis of knee: Secondary | ICD-10-CM | POA: Diagnosis not present

## 2015-11-24 LAB — GLUCOSE, CAPILLARY: GLUCOSE-CAPILLARY: 121 mg/dL — AB (ref 65–99)

## 2015-11-24 LAB — POCT GLYCOSYLATED HEMOGLOBIN (HGB A1C): Hemoglobin A1C: 6.5

## 2015-11-24 MED ORDER — IBUPROFEN 800 MG PO TABS: 800.0000 mg | ORAL_TABLET | Freq: Three times a day (TID) | ORAL | 0 refills | Status: DC | PRN

## 2015-11-24 MED ORDER — METFORMIN HCL ER 500 MG PO TB24: 500.0000 mg | ORAL_TABLET | Freq: Every day | ORAL | 11 refills | Status: DC

## 2015-11-24 NOTE — Assessment & Plan Note (Signed)
Assessment She complains of a "knee popping" sensation in both knees. She cannot identify any exacerbating activities though does recall she felt it as recently as yesterday when she was trying to wash clothes. She has undergone steroid injections in her knees in the past and is not particularly fond of them. She finds some relief with ibuprofen 800 mg that she takes once daily with meals. She is still interested in working to reduce her weight for better nutrition.  Plan -Recommended she continue to work on losing weight -Prescribed ibuprofen 800 mg to be taken every 8 hours as needed with meals -Provided her with warning signs of adverse effects associated with high-dose NSAID therapy: abdominal pain, melena, hematochezia

## 2015-11-24 NOTE — Assessment & Plan Note (Signed)
Assessment She returns today complaining of bilateral leg pain, but upon further questioning,, it appears there is a "knee popping" sensation along side calf cramping, right worse than left, which has been persistent over the last several months. The calf cramping is particularly noticeable after she exerts herself over distances as short as going from her house to her neighbor's house. No unilateral leg swelling. She does not have a prior smoking history though does have risk factors given diabetes, obesity, prior history of stroke. She noted his adherence to aspirin 81 mg and atorvastatin 40 mg daily.  Physical exam findings to raise a concern for peripheral vascular disease. No signs consistent with critical limb ischemia. It would be in our interest to screen her however for potential revascularization.  Plan -Refer for ABIs -Recommended she continue to exert herself as tolerated as physical activity and improve her functional status -Emphasized adherence to aspirin 81 mg daily and atorvastatin 40 mg daily

## 2015-11-24 NOTE — Progress Notes (Signed)
   CC: leg cramping  HPI:  [name] is a 60 y.o. female who presents today for leg cramps. Please see assessment & plan for status of chronic medical problems.   Past Medical History:  Diagnosis Date  . Diabetes mellitus   . Hyperlipidemia   . Hyperplastic colon polyp 12-2007   Dr. Ardis Hughs  . Hypertension   . Internal and external hemorrhoids without complication 0000000    Review of Systems:  Please see each problem below for a pertinent review of systems.  Physical Exam:  Vitals:   11/24/15 1525  BP: (!) 147/79  Pulse: 69  Temp: 97.6 F (36.4 C)  TempSrc: Oral  SpO2: 100%  Weight: 244 lb 9.6 oz (110.9 kg)  Height: 5\' 2"  (1.575 m)   Physical Exam  Constitutional: No distress.  HENT:  Head: Normocephalic and atraumatic.  Eyes: Conjunctivae are normal. No scleral icterus.  Cardiovascular:  Dorsalis pedis pulses difficult to palpate bilaterally though able to be auscultated with Doppler.  Pulmonary/Chest: Effort normal. No respiratory distress.  Musculoskeletal: She exhibits edema (Ankle and feet swelling bilaterally).  Skin: Skin is warm. She is not diaphoretic. No pallor.  No cyanosis of the feet and toes bilaterally.      Assessment & Plan:   See Encounters Tab for problem based charting.  Patient discussed with Dr. Lynnae January

## 2015-11-24 NOTE — Assessment & Plan Note (Signed)
Assessment Given her age and diabetes, she is eligible to receive the 23 valent pneumococcal vaccine.  Plan -Administered 23 valent pneumococcal vaccine

## 2015-11-24 NOTE — Assessment & Plan Note (Signed)
Assessment A1c 6.5, improved from 7.2 in May 2017. She attributes her improvement in glycemic control to metformin 500 mg XR as well as better nutrition for the less and she learned with her last visit with her clinic diabetic educator. She has reduced her intake of caffeinated beverages and increase her intake of vegetables, fruits, and other high-fiber items.  Given her age and other comorbidities, glycemic target of A1c 7% is reasonable.  Plan -Encouraged her to keep up the good work -Continue metformin 500 mg XR -Follow-up in 3 months

## 2015-11-24 NOTE — Patient Instructions (Signed)
We will work on getting you over to get your legs checked for blood flow.   If you ever notice really bad pain, change in foot color, please go to the Emergency Department.  Keep up the good work on the diabetes, and let's see each other back in November.

## 2015-11-27 NOTE — Progress Notes (Signed)
Internal Medicine Clinic Attending  Case discussed with Dr. Patel,Rushil soon after the resident saw the patient.  We reviewed the resident's history and exam and pertinent patient test results.  I agree with the assessment, diagnosis, and plan of care documented in the resident's note. 

## 2016-01-01 DIAGNOSIS — Z1231 Encounter for screening mammogram for malignant neoplasm of breast: Secondary | ICD-10-CM | POA: Diagnosis not present

## 2016-01-10 ENCOUNTER — Other Ambulatory Visit: Payer: Self-pay | Admitting: *Deleted

## 2016-01-10 DIAGNOSIS — E119 Type 2 diabetes mellitus without complications: Secondary | ICD-10-CM

## 2016-01-10 NOTE — Telephone Encounter (Signed)
Pt has refills, but pharmacy is requesting 90-day supply.  Will forward to MD for review.Despina Hidden Cassady10/11/20171:57 PM

## 2016-01-11 MED ORDER — METFORMIN HCL ER 500 MG PO TB24: 500.0000 mg | ORAL_TABLET | Freq: Every day | ORAL | 3 refills | Status: DC

## 2016-02-16 ENCOUNTER — Ambulatory Visit (INDEPENDENT_AMBULATORY_CARE_PROVIDER_SITE_OTHER): Payer: Medicare HMO | Admitting: Internal Medicine

## 2016-02-16 VITALS — BP 118/82 | HR 73 | Temp 97.7°F | Ht 62.0 in | Wt 240.2 lb

## 2016-02-16 DIAGNOSIS — M549 Dorsalgia, unspecified: Secondary | ICD-10-CM

## 2016-02-16 DIAGNOSIS — Z8673 Personal history of transient ischemic attack (TIA), and cerebral infarction without residual deficits: Secondary | ICD-10-CM

## 2016-02-16 DIAGNOSIS — K5900 Constipation, unspecified: Secondary | ICD-10-CM

## 2016-02-16 DIAGNOSIS — Z23 Encounter for immunization: Secondary | ICD-10-CM

## 2016-02-16 DIAGNOSIS — R350 Frequency of micturition: Secondary | ICD-10-CM

## 2016-02-16 DIAGNOSIS — R1033 Periumbilical pain: Secondary | ICD-10-CM | POA: Diagnosis not present

## 2016-02-16 DIAGNOSIS — E119 Type 2 diabetes mellitus without complications: Secondary | ICD-10-CM | POA: Diagnosis not present

## 2016-02-16 DIAGNOSIS — M1990 Unspecified osteoarthritis, unspecified site: Secondary | ICD-10-CM

## 2016-02-16 DIAGNOSIS — M170 Bilateral primary osteoarthritis of knee: Secondary | ICD-10-CM

## 2016-02-16 DIAGNOSIS — M76892 Other specified enthesopathies of left lower limb, excluding foot: Secondary | ICD-10-CM

## 2016-02-16 LAB — GLUCOSE, CAPILLARY: Glucose-Capillary: 112 mg/dL — ABNORMAL HIGH (ref 65–99)

## 2016-02-16 LAB — POCT GLYCOSYLATED HEMOGLOBIN (HGB A1C): HEMOGLOBIN A1C: 7.1

## 2016-02-16 MED ORDER — GLIPIZIDE 5 MG PO TABS: 5.0000 mg | ORAL_TABLET | Freq: Every day | ORAL | 11 refills | Status: DC

## 2016-02-16 MED ORDER — SENNA 8.6 MG PO TABS: 2.0000 | ORAL_TABLET | Freq: Every day | ORAL | 11 refills | Status: DC

## 2016-02-16 MED ORDER — IBUPROFEN 800 MG PO TABS: 800.0000 mg | ORAL_TABLET | Freq: Three times a day (TID) | ORAL | 0 refills | Status: DC | PRN

## 2016-02-16 MED FILL — glipiZIDE 5 MG TABS: 5 | 30 days supply | Qty: 30 | Fill #0

## 2016-02-16 NOTE — Progress Notes (Signed)
   CC: Urinary tract infection  HPI:  [name] is a 60 y.o. female who presents today for urinary tract infection. Please see assessment & plan for status of chronic medical problems.   Past Medical History:  Diagnosis Date  . Diabetes mellitus   . Hyperlipidemia   . Hyperplastic colon polyp 12-2007   Dr. Ardis Hughs  . Hypertension   . Internal and external hemorrhoids without complication 0000000    Review of Systems:  Please see each problem below for a pertinent review of systems.   Physical Exam:  Vitals:   02/16/16 1517  BP: 118/82  Pulse: 73  Temp: 97.7 F (36.5 C)  TempSrc: Oral  SpO2: 99%  Weight: 240 lb 3.2 oz (109 kg)  Height: 5\' 2"  (1.575 m)    Physical Exam  Constitutional: She is oriented to person, place, and time. No distress.  HENT:  Head: Normocephalic and atraumatic.  Eyes: Conjunctivae are normal. No scleral icterus.  Abdominal: Bowel sounds are normal. She exhibits no distension. There is tenderness (Mild tenderness noted with palpation just under the umbilicus).  Neurological: She is alert and oriented to person, place, and time.  Skin: She is not diaphoretic.     Assessment & Plan:   See Encounters Tab for problem based charting.  Patient discussed with Dr. Daryll Drown

## 2016-02-16 NOTE — Patient Instructions (Signed)
For your leg study, we will give you a call.  Please see me back in February to see how you are doing on the new medication.

## 2016-02-19 ENCOUNTER — Ambulatory Visit (HOSPITAL_COMMUNITY): Admission: RE | Admit: 2016-02-19 | Payer: Medicare HMO | Source: Ambulatory Visit

## 2016-02-19 ENCOUNTER — Encounter: Payer: Self-pay | Admitting: Internal Medicine

## 2016-02-19 ENCOUNTER — Telehealth: Payer: Self-pay

## 2016-02-19 NOTE — Assessment & Plan Note (Signed)
Assessment She is agreeable to receiving the flu vaccine today.  Plan -Administer flu vaccine

## 2016-02-19 NOTE — Telephone Encounter (Signed)
Patient called to report that new medication for her diabetes Glipizide is making her shake and fell dizzy. Instructed not to take until further instructions are given please advise

## 2016-02-19 NOTE — Assessment & Plan Note (Signed)
Assessment For arthritic pain, she would like for me to refill ibuprofen 800 mg. She tells me she does not want to become addicted to this medication and therefore takes it 1-2 times a month.  Plan -Reassured her that she won't become addicted to this medication as it should be used for breakthrough pain -Refill ibuprofen 800 mg to be taken every 8 hours as needed 30 tablets

## 2016-02-19 NOTE — Assessment & Plan Note (Signed)
Assessment Last month, she found out one of her close friends died. Shortly thereafter, she felt an intense pain in her right head and weakness in her right arm. She took ibuprofen after which she rested and felt drowsy upon awakening. Her symptoms resolved within 24 hours though she is concerned she may have had another stroke.  It is quite possible that she may have just had these symptoms in the setting of intense emotional distress though her prior history of CVA does put her at risk for recurrent event. He has no gross focal deficits on exam.  Plan -Recommended that she seek immediate medical assistance if she has similar symptoms in the future

## 2016-02-19 NOTE — Assessment & Plan Note (Signed)
Assessment A1c 6.5 back in August. She has stopped metformin because it "for her stomach up." She has been working on cutting back on her sugary intake and denies any symptoms of hyperglycemia, like polydipsia or polyuria.  Given her age and other comorbidities, her glycemic goal should be A1c less than 7. I suspect her control has deteriorated off metformin therapy.  Plan -Recheck A1c  ADDENDUM 02/16/2016  4:45 PM:  A1c 7.1, worse from 6.5 back in August. She is agreeable to starting glipizide 5 mg at breakfast with reassessment in 3 months.

## 2016-02-19 NOTE — Telephone Encounter (Signed)
Can she cut it in half? She should take it with breakfast to avoid hypoglycemia.

## 2016-02-19 NOTE — Assessment & Plan Note (Signed)
Assessment Over the last couple days, she has developed back pain and abdominal pain is concerned she may have urinary tract infection. Pain started when she was trying to get up from the chair. Though she acknowledges increased urinary frequency, she denies dysuria. She also has bowel movements once a week and has to take absence salts. Otherwise, she denies any fever.  I do not think she has urinary tract infection and wonder if her urinary complaints are in the setting of constipation.  Plan -Prescribed senna 2 tablets at bedtime

## 2016-02-19 NOTE — Assessment & Plan Note (Signed)
Assessment She has not had her ABI study yet.  Plan -Schedule ABI study

## 2016-02-20 NOTE — Telephone Encounter (Signed)
Called patient to instruct her half glipizide and take with breakfast. To call back if her symptoms persist after trying half of the medication

## 2016-02-23 NOTE — Progress Notes (Signed)
Internal Medicine Clinic Attending  Case discussed with Dr. Patel,Rushil soon after the resident saw the patient.  We reviewed the resident's history and exam and pertinent patient test results.  I agree with the assessment, diagnosis, and plan of care documented in the resident's note. 

## 2016-04-16 DIAGNOSIS — Z6841 Body Mass Index (BMI) 40.0 and over, adult: Secondary | ICD-10-CM | POA: Diagnosis not present

## 2016-04-16 DIAGNOSIS — E119 Type 2 diabetes mellitus without complications: Secondary | ICD-10-CM | POA: Diagnosis not present

## 2016-04-16 DIAGNOSIS — Z791 Long term (current) use of non-steroidal anti-inflammatories (NSAID): Secondary | ICD-10-CM | POA: Diagnosis not present

## 2016-04-16 DIAGNOSIS — M170 Bilateral primary osteoarthritis of knee: Secondary | ICD-10-CM | POA: Diagnosis not present

## 2016-04-16 DIAGNOSIS — Z0000 Encounter for general adult medical examination without abnormal findings: Secondary | ICD-10-CM | POA: Diagnosis not present

## 2016-04-16 DIAGNOSIS — Z79899 Other long term (current) drug therapy: Secondary | ICD-10-CM | POA: Diagnosis not present

## 2016-04-16 DIAGNOSIS — E7800 Pure hypercholesterolemia, unspecified: Secondary | ICD-10-CM | POA: Diagnosis not present

## 2016-04-16 DIAGNOSIS — Z7982 Long term (current) use of aspirin: Secondary | ICD-10-CM | POA: Diagnosis not present

## 2016-05-17 ENCOUNTER — Encounter (INDEPENDENT_AMBULATORY_CARE_PROVIDER_SITE_OTHER): Payer: Self-pay

## 2016-05-17 ENCOUNTER — Encounter: Payer: Self-pay | Admitting: Internal Medicine

## 2016-05-17 ENCOUNTER — Ambulatory Visit (INDEPENDENT_AMBULATORY_CARE_PROVIDER_SITE_OTHER): Payer: Medicare HMO | Admitting: Internal Medicine

## 2016-05-17 VITALS — BP 130/73 | HR 74 | Temp 98.4°F | Ht 62.0 in | Wt 239.9 lb

## 2016-05-17 DIAGNOSIS — E119 Type 2 diabetes mellitus without complications: Secondary | ICD-10-CM | POA: Diagnosis not present

## 2016-05-17 DIAGNOSIS — M76892 Other specified enthesopathies of left lower limb, excluding foot: Secondary | ICD-10-CM | POA: Diagnosis not present

## 2016-05-17 DIAGNOSIS — R69 Illness, unspecified: Secondary | ICD-10-CM | POA: Diagnosis not present

## 2016-05-17 LAB — GLUCOSE, CAPILLARY: Glucose-Capillary: 161 mg/dL — ABNORMAL HIGH (ref 65–99)

## 2016-05-17 LAB — POCT GLYCOSYLATED HEMOGLOBIN (HGB A1C): Hemoglobin A1C: 6.5

## 2016-05-17 MED ORDER — GLUCOSE BLOOD VI STRP: ORAL_STRIP | 12 refills | Status: AC

## 2016-05-17 MED ORDER — IBUPROFEN 800 MG PO TABS: 800.0000 mg | ORAL_TABLET | Freq: Three times a day (TID) | ORAL | 0 refills | Status: DC | PRN

## 2016-05-17 MED ORDER — IBUPROFEN 800 MG PO TABS: 800.0000 mg | ORAL_TABLET | Freq: Three times a day (TID) | ORAL | 3 refills | Status: DC | PRN

## 2016-05-17 MED ORDER — ALBUTEROL SULFATE HFA 108 (90 BASE) MCG/ACT IN AERS: 2.0000 | INHALATION_SPRAY | Freq: Four times a day (QID) | RESPIRATORY_TRACT | 0 refills | Status: DC | PRN

## 2016-05-17 MED ORDER — ONETOUCH VERIO W/DEVICE KIT: 1.0000 [IU] | PACK | 0 refills | Status: AC | PRN

## 2016-05-17 MED ORDER — ONETOUCH DELICA LANCETS 33G MISC: 0 refills | Status: AC

## 2016-05-17 MED ORDER — ALBUTEROL SULFATE HFA 108 (90 BASE) MCG/ACT IN AERS: 2.0000 | INHALATION_SPRAY | Freq: Four times a day (QID) | RESPIRATORY_TRACT | 3 refills | Status: DC | PRN

## 2016-05-17 NOTE — Progress Notes (Signed)
   CC: diabetes  HPI:  Ms.Mirza Darnell Level Getz is a 61 y.o. woman who presents today for diabetes. Please see assessment & plan for status of chronic medical problems.   Past Medical History:  Diagnosis Date  . Diabetes mellitus   . Hyperlipidemia   . Hyperplastic colon polyp 12-2007   Dr. Ardis Hughs  . Hypertension   . Internal and external hemorrhoids without complication 0000000    Review of Systems:  Please see each problem below for a pertinent review of systems.  Physical Exam:  Vitals:   05/17/16 1358 05/17/16 1500  BP: (!) 140/99 130/73  Pulse: 78 74  Temp: 98.4 F (36.9 C)   TempSrc: Oral   SpO2: 100%   Weight: 239 lb 14.4 oz (108.8 kg)   Height: 5\' 2"  (1.575 m)    Physical Exam  Constitutional: She is oriented to person, place, and time. No distress.  HENT:  Head: Normocephalic and atraumatic.  Eyes: Conjunctivae are normal. No scleral icterus.  Cardiovascular: Normal rate and regular rhythm.   Pulmonary/Chest: Effort normal. No respiratory distress.  Neurological: She is alert and oriented to person, place, and time.  Skin: Skin is warm and dry. She is not diaphoretic.     Assessment & Plan:   See Encounters Tab for problem based charting.  Patient discussed with Dr. Lynnae January

## 2016-05-17 NOTE — Assessment & Plan Note (Signed)
Assessment She would like a refill of her ibuprofen for her left leg pain/cramps. She did not follow-up with ABI in November due to cost and is wondering how to finance them.  Plan -Speak with staff about coverage -Refill ibuprofen 800 mg to be taken every 8 hours as needed for pain

## 2016-05-17 NOTE — Assessment & Plan Note (Signed)
Assessment Her A1c has improved to 6.5 today from 7.1 back in November 2017 off medication therapy. She felt nauseous, dizzy, dyspneic with glipizide 5 mg and with even 2.5 mg. She eats once daily which I suspect is why we cannot tolerate this medication well and is happy she can control her appetite this way. Her weight today is 239 lbs, stable from 240 lbs at her last visit. She is interested in checking her blood sugars to better assess the effects of her diet on her glycemic control.  Given her age and functional status, A1c<7% is appropriate. She has not tolerated first-line therapy, and we will add these medications as intolerances to the EMR to prevent prescriptions in the future.  Plan -Prescribed glucometer and strips that will be covered by her insurance. -Recommend retinopathy screening which she agrees she will undergo at [place] with Ms. Butch Penny Plyler for education on checking sugar

## 2016-05-17 NOTE — Patient Instructions (Addendum)
Please schedule with [name] to go over how to check your blood sugars.

## 2016-05-20 NOTE — Progress Notes (Signed)
Internal Medicine Clinic Attending  Case discussed with Dr. Patel,Rushil soon after the resident saw the patient.  We reviewed the resident's history and exam and pertinent patient test results.  I agree with the assessment, diagnosis, and plan of care documented in the resident's note. 

## 2016-06-03 DIAGNOSIS — R69 Illness, unspecified: Secondary | ICD-10-CM | POA: Diagnosis not present

## 2016-06-04 ENCOUNTER — Ambulatory Visit: Payer: Medicare HMO | Admitting: Dietician

## 2016-06-30 DIAGNOSIS — R69 Illness, unspecified: Secondary | ICD-10-CM | POA: Diagnosis not present

## 2016-07-10 ENCOUNTER — Ambulatory Visit (INDEPENDENT_AMBULATORY_CARE_PROVIDER_SITE_OTHER): Payer: Medicare HMO | Admitting: Internal Medicine

## 2016-07-10 ENCOUNTER — Encounter (INDEPENDENT_AMBULATORY_CARE_PROVIDER_SITE_OTHER): Payer: Self-pay

## 2016-07-10 ENCOUNTER — Encounter: Payer: Self-pay | Admitting: Dietician

## 2016-07-10 ENCOUNTER — Ambulatory Visit (INDEPENDENT_AMBULATORY_CARE_PROVIDER_SITE_OTHER): Payer: Medicare HMO | Admitting: Dietician

## 2016-07-10 ENCOUNTER — Encounter: Payer: Self-pay | Admitting: Internal Medicine

## 2016-07-10 VITALS — BP 154/95 | HR 71 | Temp 97.7°F | Ht 62.0 in | Wt 243.1 lb

## 2016-07-10 DIAGNOSIS — Z6841 Body Mass Index (BMI) 40.0 and over, adult: Secondary | ICD-10-CM | POA: Diagnosis not present

## 2016-07-10 DIAGNOSIS — Z713 Dietary counseling and surveillance: Secondary | ICD-10-CM

## 2016-07-10 DIAGNOSIS — I10 Essential (primary) hypertension: Secondary | ICD-10-CM

## 2016-07-10 DIAGNOSIS — E119 Type 2 diabetes mellitus without complications: Secondary | ICD-10-CM

## 2016-07-10 DIAGNOSIS — L299 Pruritus, unspecified: Secondary | ICD-10-CM | POA: Diagnosis not present

## 2016-07-10 MED ORDER — TRIAMCINOLONE 0.1 % CREAM:EUCERIN CREAM 1:1: 1.0000 "application " | TOPICAL_CREAM | Freq: Three times a day (TID) | CUTANEOUS | 0 refills | Status: DC | PRN

## 2016-07-10 MED ORDER — QUINAPRIL HCL 20 MG PO TABS: 20.0000 mg | ORAL_TABLET | Freq: Every day | ORAL | 2 refills | Status: DC

## 2016-07-10 MED ORDER — CETIRIZINE HCL 10 MG PO CHEW: 10.0000 mg | CHEWABLE_TABLET | Freq: Every day | ORAL | 0 refills | Status: DC

## 2016-07-10 NOTE — Patient Instructions (Addendum)
General Instructions: - Start using Triamcinolone cream three times daily as needed for itching - Start Cetirizine (Zyrtec) 10 mg daily - Start Quinapril 20 mg daily for blood pressure - Follow up in 2 weeks for blood pressure check and labs  Please bring your medicines with you each time you come to clinic.  Medicines may include prescription medications, over-the-counter medications, herbal remedies, eye drops, vitamins, or other pills.   Progress Toward Treatment Goals:  Treatment Goal 08/26/2014  Hemoglobin A1C improved  Blood pressure at goal    Self Care Goals & Plans:  Self Care Goal 05/17/2016  Manage my medications take my medicines as prescribed; bring my medications to every visit; refill my medications on time  Monitor my health check my feet daily  Eat healthy foods drink diet soda or water instead of juice or soda; eat more vegetables; eat foods that are low in salt; eat baked foods instead of fried foods; eat fruit for snacks and desserts  Be physically active find an activity I enjoy  Meeting treatment goals maintain the current self-care plan    Home Blood Glucose Monitoring 08/26/2014  Check my blood sugar no home glucose monitoring  When to check my blood sugar N/A     Care Management & Community Referrals:  Referral 08/26/2014  Referrals made for care management support none needed

## 2016-07-10 NOTE — Patient Instructions (Signed)
Dear [name],  Thank you for your visit today!   Try to check your blood sugar at different times a day-  before breakfast,  before lunch and  before dinner.   This will allow you to see how your blood sugar runs at different times of the day.   Your blood sugar should be between 70 and 130 before meals.   If it is higher, ask yourself what you have eaten in the past 5 hours and what activity you have done. This can help you learn what makes your blood sugar go up and what makes your blood sugar go down.  I suggest we meet again in the next 3 months to reinforce what you learn from checking your blood sugar.   Call anytime!  Butch Penny  [phone]

## 2016-07-10 NOTE — Progress Notes (Signed)
   CC: Bilateral arm itching  HPI:  Ms.Adrinne Darnell Level Austin is a 61 y.o. woman with past medical history as noted below who presents today for evaluation of bilateral arm itching.  Pruritus: Reports a 2 month history of bilateral arm itching. She reports she was using a different laundry detergent when she had a breakout of bumps and itching on her arms. She has been using Benadryl and hydrocortisone cream with minimal relief. She reports she recently changed back to Tide detergent which she has not had skin issues with in the past. Additionally, she reports using perfume, which has caused her to break out in the past.  HTN: BP is elevated at 158/90. She is not on any antihypertensives. She reports she used to be on blood pressure medication but this was stopped.  Past Medical History:  Diagnosis Date  . Diabetes mellitus   . Hyperlipidemia   . Hyperplastic colon polyp 12-2007   Dr. Ardis Hughs  . Hypertension   . Internal and external hemorrhoids without complication 9/62/8366    Review of Systems:   General: Denies fever, chills, night sweats, changes in weight, changes in appetite HEENT: Denies headaches, ear pain, changes in vision, rhinorrhea, sore throat CV: Denies CP, palpitations, SOB, orthopnea Pulm: Denies SOB, cough, wheezing GI: Denies abdominal pain, nausea, vomiting, diarrhea, constipation, melena, hematochezia GU: Denies dysuria, hematuria, frequency Msk: Denies muscle cramps, joint pains Neuro: Denies weakness, numbness, tingling Skin: Denies bruising Psych: Denies depression, anxiety, hallucinations  Physical Exam:  Vitals:   07/10/16 1320  BP: (!) 158/90  Pulse: 77  Temp: 97.7 F (36.5 C)  TempSrc: Oral  SpO2: 100%  Weight: 243 lb 1.6 oz (110.3 kg)  Height: 5\' 2"  (1.575 m)   Repeat BP 154/95  General: Obese woman in NAD Skin: Several papules present on the bilateral upper arms and forearms. No associated erythema. Excoriations present. No lesions in the webs of  the hands.   Assessment & Plan:   See Encounters Tab for problem based charting.  Patient discussed with Dr. Dareen Piano

## 2016-07-10 NOTE — Progress Notes (Signed)
  Medical Nutrition Therapy:  Appt start time: 1340 end time:  1400. Visit # 2 Last visit was 10/31/2015  Assessment:  Primary concerns today: wants help learning to use meter.  Ms. Goffredo was ten off her diabetes medicine and maintains A1C pf 6-7 range. She has had very little change in her weight since her last visit for Medical nutrition . Her A1C decreased form 7.2 to 6.5% after 1 MNT visit then rose again to 7.1% in November and now is back to 6.5%  Preferred Learning Style:  Hands on for today's information.  Learning Readiness: Ready  ANTHROPOMETRICS: weight-243.1#,  BMI- 44.46 MEDICATIONS: none for diabetes  Progress Towards Goal(s):  In progress.   Nutritional Diagnosis:  NB-1.1 Food and nutrition-related knowledge deficit As related to lack of sufficient diabetes training .  As evidenced by her asking for assistance with leanring how to use her meter and how to interpret the results.    Intervention:  Nutrition education about how to use a meter and lancing device using her new One touch verio meter and supplies. Also provided guidelines about how to interpret the results.  Coordination of care: none  Teaching Method Utilized: Visual,Auditory, Hands on Handouts given during visit include: Barriers to learning/adherence to lifestyle change: competing values Demonstrated degree of understanding via:  Teach Back   Monitoring/Evaluation:  Dietary intake, exercise, meter and body weight with in 3 month(s).   Carlina Derks, Butch Penny, [place] 07/10/2016 3:40 PM.

## 2016-07-11 DIAGNOSIS — L299 Pruritus, unspecified: Secondary | ICD-10-CM | POA: Insufficient documentation

## 2016-07-11 NOTE — Assessment & Plan Note (Signed)
Her pruritus seems most likely due to a contact dermatitis vs eczema. She does not have a clear demarcating line which makes contact dermatitis less likely. She had no interdigit lesions to suggest scabies. Agreed with her that she should change back to her Tide detergent and avoid perfume. She was given triamcinolone cream as she has already tried hydrocortisone cream. Will also have her try cetirizine for itching. She was advised to return to clinic if the itching does not improve.

## 2016-07-11 NOTE — Assessment & Plan Note (Signed)
BP elevated. Will have her restart Quinapril 20 mg daily. Follow up in 2 weeks for BP recheck and bmet.

## 2016-07-12 NOTE — Progress Notes (Signed)
Internal Medicine Clinic Attending  Case discussed with Dr. Rivet soon after the resident saw the patient.  We reviewed the resident's history and exam and pertinent patient test results.  I agree with the assessment, diagnosis, and plan of care documented in the resident's note.  

## 2016-07-24 ENCOUNTER — Ambulatory Visit: Payer: Medicare HMO

## 2016-08-05 DIAGNOSIS — R69 Illness, unspecified: Secondary | ICD-10-CM | POA: Diagnosis not present

## 2016-09-02 ENCOUNTER — Encounter: Payer: Self-pay | Admitting: *Deleted

## 2016-09-17 ENCOUNTER — Encounter: Payer: Self-pay | Admitting: Internal Medicine

## 2016-09-17 ENCOUNTER — Ambulatory Visit (INDEPENDENT_AMBULATORY_CARE_PROVIDER_SITE_OTHER): Payer: Medicare HMO | Admitting: Internal Medicine

## 2016-09-17 VITALS — BP 130/70 | HR 65 | Temp 98.2°F | Ht 62.0 in | Wt 239.1 lb

## 2016-09-17 DIAGNOSIS — M1711 Unilateral primary osteoarthritis, right knee: Secondary | ICD-10-CM

## 2016-09-17 DIAGNOSIS — I10 Essential (primary) hypertension: Secondary | ICD-10-CM | POA: Diagnosis not present

## 2016-09-17 DIAGNOSIS — M170 Bilateral primary osteoarthritis of knee: Secondary | ICD-10-CM

## 2016-09-17 DIAGNOSIS — Z8673 Personal history of transient ischemic attack (TIA), and cerebral infarction without residual deficits: Secondary | ICD-10-CM | POA: Diagnosis not present

## 2016-09-17 LAB — GLUCOSE, CAPILLARY: GLUCOSE-CAPILLARY: 111 mg/dL — AB (ref 65–99)

## 2016-09-17 NOTE — Patient Instructions (Signed)
For the knee pain, please take ibuprofen as needed. Don't take more than 4 tablets in 1 day.  For the blood pressure, try taking quinapril in the morning. If you still feel dizzy and lightheaded, cut it in 1/2.   Come back in 1 month for blood work.  It was a pleasure taking care of you, and I wish you well.

## 2016-09-17 NOTE — Progress Notes (Signed)
   CC: left knee pain  HPI:  [name] is a 61 y.o. who presents today for left knee pain. Please see assessment & plan for status of chronic medical problems.   Past Medical History:  Diagnosis Date  . Diabetes mellitus 2007  . Hyperlipidemia   . Hyperplastic colon polyp 12-2007   Dr. Ardis Hughs  . Hypertension   . Internal and external hemorrhoids without complication 7/56/4332    Review of Systems:  Please see each problem below for a pertinent review of systems.  Physical Exam:  Vitals:   09/17/16 1323  BP: 130/70  Pulse: 65  Temp: 98.2 F (36.8 C)  TempSrc: Oral  SpO2: 100%  Weight: 239 lb 1.6 oz (108.5 kg)  Height: 5\' 2"  (1.575 m)   Physical Exam  Constitutional: She is oriented to person, place, and time. No distress.  HENT:  Head: Normocephalic and atraumatic.  Eyes: Conjunctivae are normal. No scleral icterus.  Pulmonary/Chest: Effort normal. No respiratory distress.  Musculoskeletal:  Left knee joint without deformities, laxity, or effusion. Crepitus with flexion. Pain elicited with McMurray test.   Neurological: She is alert and oriented to person, place, and time.  Skin: She is not diaphoretic.   Assessment & Plan:   See Encounters Tab for problem based charting.  Patient seen with Dr. Evette Doffing

## 2016-09-18 NOTE — Assessment & Plan Note (Addendum)
Assessment Yesterday, her right knee started hurting upon rising from a seated position. It has limited her ability to go up and down steps. She has not had issues with her left knee and was going to try ibuprofen 800 mg that has been prescribed for her right knee pain.   Her exam findings suggest mild knee osteoarthritis with possible meniscal injury.   Plan -Recommended ibuprofen 800 mg every 8 hours as needed to help -Consider repeat imaging in 2 weeks if no better to further characterize joint disease

## 2016-09-18 NOTE — Assessment & Plan Note (Signed)
Assessment Her blood pressure 130/70 is improved from 154/95 though she acknowledges not taking quinapril 10 mg as it made her feel "swimmy headed." She has been on this medication in the past and benefits from goal 120/80 given her history of CVA and age.   Plan -Recommend she try taking quinapril 10 mg in the morning and counseled her on the benefits of ACE/ARB therapy with her borderline diabetes -Repeat BMET at follow-up pending confirmation of adherence

## 2016-09-18 NOTE — Assessment & Plan Note (Signed)
Assessment Yesterday, her right knee started hurting upon rising from a seated position. It has limited her ability to go up and down steps. She has not had issues with her left knee and was going to try ibuprofen 800 mg that has been prescribed for her right knee pain.   Her exam findings suggest mild knee osteoarthritis with possible meniscal injury.   Plan -Recommended ibuprofen 800 mg every 8 hours as needed to help -Consider repeat imaging in 2 weeks if no better to further characterize joint disease

## 2016-09-19 NOTE — Progress Notes (Signed)
Internal Medicine Clinic Attending  I saw and evaluated the patient.  I personally confirmed the key portions of the history and exam documented by Dr. Patel,Rushil and I reviewed pertinent patient test results.  The assessment, diagnosis, and plan were formulated together and I agree with the documentation in the resident's note. 

## 2016-12-31 ENCOUNTER — Other Ambulatory Visit: Payer: Self-pay | Admitting: *Deleted

## 2016-12-31 DIAGNOSIS — M76892 Other specified enthesopathies of left lower limb, excluding foot: Secondary | ICD-10-CM

## 2016-12-31 MED ORDER — ALBUTEROL SULFATE HFA 108 (90 BASE) MCG/ACT IN AERS: 2.0000 | INHALATION_SPRAY | Freq: Four times a day (QID) | RESPIRATORY_TRACT | 0 refills | Status: DC | PRN

## 2016-12-31 NOTE — Telephone Encounter (Signed)
Provided 1 inhaler refill. Do not see Dx code on PL associated with Asthma or COPD or any PFTs on file.  Review of last several notes does not mention albuterol. I do see a visit from 2017 where is was prescribed for a viral UTI.  Would ask PCP to clarify Albuterol use at next visit.

## 2017-01-01 DIAGNOSIS — Z1231 Encounter for screening mammogram for malignant neoplasm of breast: Secondary | ICD-10-CM | POA: Diagnosis not present

## 2017-02-14 ENCOUNTER — Encounter (INDEPENDENT_AMBULATORY_CARE_PROVIDER_SITE_OTHER): Payer: Self-pay

## 2017-02-14 ENCOUNTER — Encounter: Payer: Medicare HMO | Admitting: Internal Medicine

## 2017-02-14 ENCOUNTER — Other Ambulatory Visit: Payer: Self-pay

## 2017-02-14 ENCOUNTER — Ambulatory Visit (INDEPENDENT_AMBULATORY_CARE_PROVIDER_SITE_OTHER): Payer: Medicare HMO | Admitting: Internal Medicine

## 2017-02-14 ENCOUNTER — Other Ambulatory Visit (HOSPITAL_COMMUNITY)
Admission: RE | Admit: 2017-02-14 | Discharge: 2017-02-14 | Disposition: A | Discharge status: Home / Self Care | Payer: Medicare HMO | Source: Ambulatory Visit | Attending: Internal Medicine | Admitting: Internal Medicine

## 2017-02-14 ENCOUNTER — Other Ambulatory Visit: Payer: Self-pay | Admitting: Internal Medicine

## 2017-02-14 ENCOUNTER — Encounter: Payer: Self-pay | Admitting: Internal Medicine

## 2017-02-14 VITALS — BP 161/81 | HR 73 | Temp 97.5°F | Ht 62.0 in | Wt 230.1 lb

## 2017-02-14 DIAGNOSIS — Z01419 Encounter for gynecological examination (general) (routine) without abnormal findings: Secondary | ICD-10-CM | POA: Insufficient documentation

## 2017-02-14 DIAGNOSIS — E119 Type 2 diabetes mellitus without complications: Secondary | ICD-10-CM

## 2017-02-14 DIAGNOSIS — E785 Hyperlipidemia, unspecified: Secondary | ICD-10-CM

## 2017-02-14 DIAGNOSIS — R87810 Cervical high risk human papillomavirus (HPV) DNA test positive: Secondary | ICD-10-CM

## 2017-02-14 DIAGNOSIS — Z6841 Body Mass Index (BMI) 40.0 and over, adult: Secondary | ICD-10-CM

## 2017-02-14 DIAGNOSIS — Z23 Encounter for immunization: Secondary | ICD-10-CM | POA: Diagnosis not present

## 2017-02-14 DIAGNOSIS — E669 Obesity, unspecified: Secondary | ICD-10-CM | POA: Diagnosis not present

## 2017-02-14 DIAGNOSIS — E1169 Type 2 diabetes mellitus with other specified complication: Secondary | ICD-10-CM

## 2017-02-14 DIAGNOSIS — M76892 Other specified enthesopathies of left lower limb, excluding foot: Secondary | ICD-10-CM

## 2017-02-14 DIAGNOSIS — I10 Essential (primary) hypertension: Secondary | ICD-10-CM

## 2017-02-14 DIAGNOSIS — Z79899 Other long term (current) drug therapy: Secondary | ICD-10-CM | POA: Diagnosis not present

## 2017-02-14 DIAGNOSIS — R69 Illness, unspecified: Secondary | ICD-10-CM | POA: Diagnosis not present

## 2017-02-14 LAB — GLUCOSE, CAPILLARY: Glucose-Capillary: 94 mg/dL (ref 65–99)

## 2017-02-14 LAB — POCT GLYCOSYLATED HEMOGLOBIN (HGB A1C): Hemoglobin A1C: 6.4

## 2017-02-14 MED ORDER — LISINOPRIL 20 MG PO TABS: 20.0000 mg | ORAL_TABLET | Freq: Every day | ORAL | 0 refills | Status: DC

## 2017-02-14 MED ORDER — ATORVASTATIN CALCIUM 40 MG PO TABS: 40.0000 mg | ORAL_TABLET | Freq: Every day | ORAL | 0 refills | Status: DC

## 2017-02-14 MED ORDER — IBUPROFEN 800 MG PO TABS: 800.0000 mg | ORAL_TABLET | Freq: Three times a day (TID) | ORAL | 0 refills | Status: DC | PRN

## 2017-02-14 NOTE — Patient Instructions (Signed)
[name],  I have sent in your Lipitor for your cholesterol. I want you to start taking Lisinopril 20 mg daily for your blood pressure.   We will let you know the results of your PAP smear testing.  Please follow up with Dr. Rivka Barbara in January.

## 2017-02-14 NOTE — Assessment & Plan Note (Signed)
Refill Lipitor 40 mg daily today.

## 2017-02-14 NOTE — Assessment & Plan Note (Signed)
BP Readings from Last 3 Encounters:  02/14/17 (!) 161/81  09/17/16 130/70  07/10/16 (!) 154/95    Lab Results  Component Value Date   NA 141 08/22/2015   K 4.3 08/22/2015   CREATININE 0.73 08/22/2015   BP uncontrolled today. She reports she is not taking any medications currently. Has been prescribed quinapril in the past but believes this made her have palpitations and stopped taking it. Denies any headaches, chest pain, SOB, nausea/vomiting, vision changes today.  A/P Will prescribed lisinopril 20 mg daily and recommended the patient try this medication. Check BMET today. Follow up with PCP in January.

## 2017-02-14 NOTE — Assessment & Plan Note (Addendum)
Lab Results  Component Value Date   HGBA1C 6.5 05/17/2016   HGBA1C 7.1 02/16/2016   HGBA1C 6.5 11/24/2015    Most recent A1c was 6.5 in 05/2016. No currently on any medications. Denies any polyuria or polydipsia. Weight is stable from previous.   A/P: Checking A1c today, 6.4. Continue to monitor annually.

## 2017-02-14 NOTE — Progress Notes (Signed)
   CC: medication refill  HPI:  [name] is a 61 y.o. female with a past medical history listed below here today for follow up of her DM, HTN, HLD.  She reports she is here today for routine check up for her chronic medical problems. States she needs refills on her medications. Reports she is only taking a cholesterol medication today.   For details of today's visit and the status of her chronic medical issues please refer to the assessment and plan.   Past Medical History:  Diagnosis Date  . Diabetes mellitus 2007  . Hyperlipidemia   . Hyperplastic colon polyp 12-2007   Dr. Ardis Hughs  . Hypertension   . Internal and external hemorrhoids without complication 5/68/1275   Review of Systems:   No chest pain or shortness of breath  Physical Exam:  Vitals:   02/14/17 1433  BP: (!) 161/81  Pulse: 73  Temp: (!) 97.5 F (36.4 C)  TempSrc: Oral  SpO2: 100%  Weight: 230 lb 1.6 oz (104.4 kg)  Height: 5\' 2"  (1.575 m)   Physical Exam  Constitutional: She is well-developed, well-nourished, and in no distress.  Obese female  HENT:  Head: Normocephalic and atraumatic.  Cardiovascular: Normal rate and regular rhythm.  Pulmonary/Chest: Effort normal and breath sounds normal.  Abdominal: Soft. Bowel sounds are normal.  Genitourinary: Vagina normal and cervix normal. No vaginal discharge found.  Skin: Skin is warm and dry. No rash noted.  Psychiatric: Affect normal.     Assessment & Plan:   See Encounters Tab for problem based charting.  Patient discussed with Dr. Angelia Mould

## 2017-02-14 NOTE — Assessment & Plan Note (Addendum)
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 Due for repeat PAP smear today.  PAP smear performed in clinic today with RN Daisy as chaperone.   Follow up PAP co-testing results.   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 Will refer to gyn for colonoscopy.

## 2017-02-15 LAB — BMP8+ANION GAP
ANION GAP: 15 mmol/L (ref 10.0–18.0)
BUN / CREAT RATIO: 11 — AB (ref 12–28)
BUN: 8 mg/dL (ref 8–27)
CO2: 27 mmol/L (ref 20–29)
CREATININE: 0.74 mg/dL (ref 0.57–1.00)
Calcium: 9.2 mg/dL (ref 8.7–10.3)
Chloride: 101 mmol/L (ref 96–106)
GFR calc Af Amer: 101 mL/min/{1.73_m2} (ref >59)
GFR, EST NON AFRICAN AMERICAN: 88 mL/min/{1.73_m2} (ref >59)
Glucose: 92 mg/dL (ref 65–99)
Potassium: 4.1 mmol/L (ref 3.5–5.2)
SODIUM: 143 mmol/L (ref 134–144)

## 2017-02-18 NOTE — Progress Notes (Signed)
Internal Medicine Clinic Attending  Case discussed with Dr. Boswell at the time of the visit.  We reviewed the resident's history and exam and pertinent patient test results.  I agree with the assessment, diagnosis, and plan of care documented in the resident's note.  

## 2017-02-19 LAB — CYTOLOGY - PAP
Diagnosis: NEGATIVE
HPV: DETECTED — AB

## 2017-02-19 NOTE — Telephone Encounter (Signed)
Refilled Albuterol inhaler

## 2017-02-24 NOTE — Addendum Note (Signed)
Addended by: Blanford Lions on: 02/24/2017 09:33 AM   Modules accepted: Orders

## 2017-03-19 DIAGNOSIS — N882 Stricture and stenosis of cervix uteri: Secondary | ICD-10-CM | POA: Diagnosis not present

## 2017-03-19 DIAGNOSIS — R69 Illness, unspecified: Secondary | ICD-10-CM | POA: Diagnosis not present

## 2017-04-01 HISTORY — PX: CATARACT EXTRACTION, BILATERAL: SHX1313

## 2017-04-03 NOTE — Progress Notes (Signed)
   CC: Left knee pain  HPI:  [name] is a 62 y.o. f with pmh of essential hypertension, osteoarthritis, diabetes mellitus type 2 who presented for follow up to discuss left knee pain. Please see problem based charting for evaluation, assessment, and plan.   Past Medical History:  Diagnosis Date  . Diabetes mellitus 2007  . Hyperlipidemia   . Hyperplastic colon polyp 12-2007   Dr. Ardis Hughs  . Hypertension   . Internal and external hemorrhoids without complication 8/65/7846   Review of Systems:  Denies any headaches, sore throat, sob, chest pain, abdominal pain, nausea or vomiting.   Physical Exam:  Vitals:   04/04/17 1428  BP: 127/63  Weight: 231 lb 12.8 oz (105.1 kg)  Height: 5\' 2"  (1.575 m)     Physical Exam  Constitutional: She appears well-developed and well-nourished. No distress.  HENT:  Head: Normocephalic and atraumatic.  Eyes: Conjunctivae are normal.  Cardiovascular: Normal rate, regular rhythm, normal heart sounds and intact distal pulses.  Respiratory: Effort normal and breath sounds normal. No respiratory distress. She has no wheezes.  GI: Soft. Bowel sounds are normal. She exhibits no distension. There is no tenderness.  Musculoskeletal: Normal range of motion. She exhibits tenderness (inferomedial aspect of left patella). She exhibits no edema.  Palpable crepitus at left patella, no marked swelling  Neurological: She is alert.  Skin: She is not diaphoretic.  Psychiatric: She has a normal mood and affect. Her behavior is normal. Judgment and thought content normal.    Assessment & Plan:   See Encounters Tab for problem based charting.  Patient seen with Dr. Dareen Piano

## 2017-04-04 ENCOUNTER — Encounter (INDEPENDENT_AMBULATORY_CARE_PROVIDER_SITE_OTHER): Payer: Self-pay

## 2017-04-04 ENCOUNTER — Ambulatory Visit (INDEPENDENT_AMBULATORY_CARE_PROVIDER_SITE_OTHER): Payer: Medicare HMO | Admitting: Internal Medicine

## 2017-04-04 VITALS — BP 127/63 | Ht 62.0 in | Wt 231.8 lb

## 2017-04-04 DIAGNOSIS — Z0000 Encounter for general adult medical examination without abnormal findings: Secondary | ICD-10-CM

## 2017-04-04 DIAGNOSIS — R0602 Shortness of breath: Secondary | ICD-10-CM

## 2017-04-04 DIAGNOSIS — E119 Type 2 diabetes mellitus without complications: Secondary | ICD-10-CM | POA: Diagnosis not present

## 2017-04-04 DIAGNOSIS — Z13818 Encounter for screening for other digestive system disorders: Secondary | ICD-10-CM

## 2017-04-04 DIAGNOSIS — M25562 Pain in left knee: Secondary | ICD-10-CM

## 2017-04-04 DIAGNOSIS — Z9114 Patient's other noncompliance with medication regimen: Secondary | ICD-10-CM | POA: Diagnosis not present

## 2017-04-04 DIAGNOSIS — M170 Bilateral primary osteoarthritis of knee: Secondary | ICD-10-CM

## 2017-04-04 DIAGNOSIS — I10 Essential (primary) hypertension: Secondary | ICD-10-CM

## 2017-04-04 DIAGNOSIS — M1712 Unilateral primary osteoarthritis, left knee: Secondary | ICD-10-CM | POA: Diagnosis not present

## 2017-04-04 NOTE — Patient Instructions (Addendum)
It was a pleasure to take care of you today [name].   -Please continue to take all your medication as recommended  -Please record blood pressure readings and bring it to your next appointment  -Please go to get an xray of your left knee  -Please use voltaren gel for your knee pain  -Please get pulmonary function tests so we can better assess your shortness of breath and need for your inhaler.   Thank you,  Lars Mage, MD Internal Medicine PGY1   Osteoarthritis Osteoarthritis is a type of arthritis that affects tissue that covers the ends of bones in joints (cartilage). Cartilage acts as a cushion between the bones and helps them move smoothly. Osteoarthritis results when cartilage in the joints gets worn down. Osteoarthritis is sometimes called "wear and tear" arthritis. Osteoarthritis is the most common form of arthritis. It often occurs in older people. It is a condition that gets worse over time (a progressive condition). Joints that are most often affected by this condition are in:  Fingers.  Toes.  Hips.  Knees.  Spine, including neck and lower back.  What are the causes? This condition is caused by age-related wearing down of cartilage that covers the ends of bones. What increases the risk? The following factors may make you more likely to develop this condition:  Older age.  Being overweight or obese.  Overuse of joints, such as in athletes.  Past injury of a joint.  Past surgery on a joint.  Family history of osteoarthritis.  What are the signs or symptoms? The main symptoms of this condition are pain, swelling, and stiffness in the joint. The joint may lose its shape over time. Small pieces of bone or cartilage may break off and float inside of the joint, which may cause more pain and damage to the joint. Small deposits of bone (osteophytes) may grow on the edges of the joint. Other symptoms may include:  A grating or scraping feeling inside the joint  when you move it.  Popping or creaking sounds when you move.  Symptoms may affect one or more joints. Osteoarthritis in a major joint, such as your knee or hip, can make it painful to walk or exercise. If you have osteoarthritis in your hands, you might not be able to grip items, twist your hand, or control small movements of your hands and fingers (fine motor skills). How is this diagnosed? This condition may be diagnosed based on:  Your medical history.  A physical exam.  Your symptoms.  X-rays of the affected joint(s).  Blood tests to rule out other types of arthritis.  How is this treated? There is no cure for this condition, but treatment can help to control pain and improve joint function. Treatment plans may include:  A prescribed exercise program that allows for rest and joint relief. You may work with a physical therapist.  A weight control plan.  Pain relief techniques, such as: ? Applying heat and cold to the joint. ? Electric pulses delivered to nerve endings under the skin (transcutaneous electrical nerve stimulation, or TENS). ? Massage. ? Certain nutritional supplements.  NSAIDs or prescription medicines to help relieve pain.  Medicine to help relieve pain and inflammation (corticosteroids). This can be given by mouth (orally) or as an injection.  Assistive devices, such as a brace, wrap, splint, specialized glove, or cane.  Surgery, such as: ? An osteotomy. This is done to reposition the bones and relieve pain or to remove loose pieces  of bone and cartilage. ? Joint replacement surgery. You may need this surgery if you have very bad (advanced) osteoarthritis.  Follow these instructions at home: Activity  Rest your affected joints as directed by your health care provider.  Do not drive or use heavy machinery while taking prescription pain medicine.  Exercise as directed. Your health care provider or physical therapist may recommend specific types of  exercise, such as: ? Strengthening exercises. These are done to strengthen the muscles that support joints that are affected by arthritis. They can be performed with weights or with exercise bands to add resistance. ? Aerobic activities. These are exercises, such as brisk walking or water aerobics, that get your heart pumping. ? Range-of-motion activities. These keep your joints easy to move. ? Balance and agility exercises. Managing pain, stiffness, and swelling  If directed, apply heat to the affected area as often as told by your health care provider. Use the heat source that your health care provider recommends, such as a moist heat pack or a heating pad. ? If you have a removable assistive device, remove it as told by your health care provider. ? Place a towel between your skin and the heat source. If your health care provider tells you to keep the assistive device on while you apply heat, place a towel between the assistive device and the heat source. ? Leave the heat on for 20-30 minutes. ? Remove the heat if your skin turns bright red. This is especially important if you are unable to feel pain, heat, or cold. You may have a greater risk of getting burned.  If directed, put ice on the affected joint: ? If you have a removable assistive device, remove it as told by your health care provider. ? Put ice in a plastic bag. ? Place a towel between your skin and the bag. If your health care provider tells you to keep the assistive device on during icing, place a towel between the assistive device and the bag. ? Leave the ice on for 20 minutes, 2-3 times a day. General instructions  Take over-the-counter and prescription medicines only as told by your health care provider.  Maintain a healthy weight. Follow instructions from your health care provider for weight control. These may include dietary restrictions.  Do not use any products that contain nicotine or tobacco, such as cigarettes and  e-cigarettes. These can delay bone healing. If you need help quitting, ask your health care provider.  Use assistive devices as directed by your health care provider.  Keep all follow-up visits as told by your health care provider. This is important. Where to find more information:  [employer] of Arthritis and Musculoskeletal and Skin Diseases: www.niams.SouthExposed.es  [employer] on Aging: http://kim-miller.com/  American College of Rheumatology: www.rheumatology.org Contact a health care provider if:  Your skin turns red.  You develop a rash.  You have pain that gets worse.  You have a fever along with joint or muscle aches. Get help right away if:  You lose a lot of weight.  You suddenly lose your appetite.  You have night sweats. Summary  Osteoarthritis is a type of arthritis that affects tissue covering the ends of bones in joints (cartilage).  This condition is caused by age-related wearing down of cartilage that covers the ends of bones.  The main symptom of this condition is pain, swelling, and stiffness in the joint.  There is no cure for this condition, but treatment can help to control  pain and improve joint function. This information is not intended to replace advice given to you by your health care provider. Make sure you discuss any questions you have with your health care provider. Document Released: 03/18/2005 Document Revised: 11/20/2015 Document Reviewed: 11/20/2015 Elsevier Interactive Patient Education  [employer].

## 2017-04-05 DIAGNOSIS — R0602 Shortness of breath: Secondary | ICD-10-CM | POA: Insufficient documentation

## 2017-04-05 LAB — HEPATITIS C ANTIBODY

## 2017-04-05 LAB — HIV ANTIBODY (ROUTINE TESTING W REFLEX): HIV Screen 4th Generation wRfx: NONREACTIVE

## 2017-04-05 NOTE — Assessment & Plan Note (Addendum)
The patient states that she has been having worsening left knee pain since thanksgiving. She states that ibuprofen has not been helping her pain. The patient's last imaging was in August 2011 at which time her left knee was found to have degenerative changes in the medial  Compartment and patellofemoral joint. The patient's knee had palpable crepitus, was not markedly swollen but mildly tender to palpation on the inferomedial aspect of left patella.   -voltaren gel prescribed -xray of left foot was ordered

## 2017-04-05 NOTE — Assessment & Plan Note (Signed)
Per chart review the patient has been being prescribed albuterol for shortness of breath by previous provider. The patient requested for refills for albuterol 3 times over the past year. The patient states that she has used the inhaler approximately once a week or 3-4 times per month after she exerts herself or walks. The patient does not note any wheezing during times she has difficulty breathing.   -ordered pfts -continue albuterol as needed

## 2017-04-05 NOTE — Assessment & Plan Note (Signed)
The patient's last hgA1c=6.4 in November 2018. Denies polyuria, polydipsia, nausea, lightheadedness. The patients weight 231lbs, no weight change since previous visit. The patient is not on any antihyperglycemic agents currently.

## 2017-04-05 NOTE — Assessment & Plan Note (Addendum)
The patient's blood pressure during this visit was 127/63. The patient is supposed to be taking lisinopril 20mg  however the patient states that she stopped taking it as her niece who is a nurse told her that it causes liver cancer.   Had long discussion with patient that there has not been extensive research showing that lisinopril causes liver cancer. There have been some findings that show that lisinopril can cause liver injury, but this has been very rare. Expressed that lisinopril is protective to the kidney. The patient continued to refuse to take lisinopril.   -requested patient to record blood pressure readings at home and bring them into her next visit. If she has high blood pressure readings, alternative bp med will be started.

## 2017-04-05 NOTE — Assessment & Plan Note (Signed)
-  ordered HIV test  -ordered Hep C ab

## 2017-04-07 NOTE — Progress Notes (Signed)
Internal Medicine Clinic Attending  I saw and evaluated the patient.  I personally confirmed the key portions of the history and exam documented by Dr. Chundi and I reviewed pertinent patient test results.  The assessment, diagnosis, and plan were formulated together and I agree with the documentation in the resident's note. 

## 2017-04-15 ENCOUNTER — Ambulatory Visit (HOSPITAL_COMMUNITY)
Admission: RE | Admit: 2017-04-15 | Discharge: 2017-04-15 | Disposition: A | Discharge status: Home / Self Care | Payer: Medicare HMO | Source: Ambulatory Visit | Attending: Internal Medicine | Admitting: Internal Medicine

## 2017-04-15 ENCOUNTER — Other Ambulatory Visit: Payer: Self-pay | Admitting: Internal Medicine

## 2017-04-15 DIAGNOSIS — R0600 Dyspnea, unspecified: Secondary | ICD-10-CM | POA: Insufficient documentation

## 2017-04-15 DIAGNOSIS — I10 Essential (primary) hypertension: Secondary | ICD-10-CM

## 2017-04-15 DIAGNOSIS — Z0000 Encounter for general adult medical examination without abnormal findings: Secondary | ICD-10-CM | POA: Insufficient documentation

## 2017-04-15 LAB — PULMONARY FUNCTION TEST
DL/VA % pred: 114 %
DL/VA: 5.09 ml/min/mmHg/L
DLCO unc % pred: 76 %
DLCO unc: 15.95 ml/min/mmHg
FEF 25-75 Post: 0.93 L/sec
FEF 25-75 Pre: 1.17 L/sec
FEF2575-%Change-Post: -20 %
FEF2575-%Pred-Post: 51 %
FEF2575-%Pred-Pre: 64 %
FEV1-%Change-Post: -14 %
FEV1-%Pred-Post: 83 %
FEV1-%Pred-Pre: 98 %
FEV1-PRE: 1.79 L
FEV1-Post: 1.53 L
FEV1FVC-%Change-Post: 9 %
FEV1FVC-%Pred-Pre: 103 %
FEV6-%Change-Post: -22 %
FEV6-%PRED-POST: 76 %
FEV6-%Pred-Pre: 98 %
FEV6-PRE: 2.2 L
FEV6-Post: 1.7 L
FEV6FVC-%PRED-PRE: 104 %
FEV6FVC-%Pred-Post: 104 %
FVC-%Change-Post: -22 %
FVC-%PRED-POST: 73 %
FVC-%Pred-Pre: 94 %
FVC-Post: 1.7 L
FVC-Pre: 2.2 L
POST FEV1/FVC RATIO: 90 %
Post FEV6/FVC ratio: 100 %
Pre FEV1/FVC ratio: 82 %
Pre FEV6/FVC Ratio: 100 %

## 2017-04-15 MED ORDER — ALBUTEROL SULFATE (2.5 MG/3ML) 0.083% IN NEBU
2.5000 mg | INHALATION_SOLUTION | Freq: Once | RESPIRATORY_TRACT | Status: AC
  Administered 2017-04-15: 2.5 mg via RESPIRATORY_TRACT

## 2017-04-28 DIAGNOSIS — E785 Hyperlipidemia, unspecified: Secondary | ICD-10-CM | POA: Diagnosis not present

## 2017-04-28 DIAGNOSIS — Z8249 Family history of ischemic heart disease and other diseases of the circulatory system: Secondary | ICD-10-CM | POA: Diagnosis not present

## 2017-04-28 DIAGNOSIS — E119 Type 2 diabetes mellitus without complications: Secondary | ICD-10-CM | POA: Diagnosis not present

## 2017-04-28 DIAGNOSIS — Z7722 Contact with and (suspected) exposure to environmental tobacco smoke (acute) (chronic): Secondary | ICD-10-CM | POA: Diagnosis not present

## 2017-04-28 DIAGNOSIS — I10 Essential (primary) hypertension: Secondary | ICD-10-CM | POA: Diagnosis not present

## 2017-04-28 DIAGNOSIS — Z7982 Long term (current) use of aspirin: Secondary | ICD-10-CM | POA: Diagnosis not present

## 2017-04-28 DIAGNOSIS — Z833 Family history of diabetes mellitus: Secondary | ICD-10-CM | POA: Diagnosis not present

## 2017-04-28 DIAGNOSIS — Z823 Family history of stroke: Secondary | ICD-10-CM | POA: Diagnosis not present

## 2017-04-28 DIAGNOSIS — R0602 Shortness of breath: Secondary | ICD-10-CM | POA: Diagnosis not present

## 2017-04-29 ENCOUNTER — Other Ambulatory Visit: Payer: Self-pay | Admitting: Internal Medicine

## 2017-04-29 DIAGNOSIS — I10 Essential (primary) hypertension: Secondary | ICD-10-CM

## 2017-04-30 ENCOUNTER — Telehealth: Payer: Self-pay | Admitting: Internal Medicine

## 2017-04-30 NOTE — Telephone Encounter (Signed)
Lisinopril refill recently denied as "refill not appropriate" by pcp-will again deny refill.  Attempted to contact patient to see if she initiated the refill-no answer, unable to leave message.Regenia Skeeter, Darlene Cassady1/30/20198:49 AM

## 2017-04-30 NOTE — Telephone Encounter (Signed)
Return call made to patient as I had previously tried to contact pt regarding lisinopril refill request received from pt's pharmacy.  Pt is no longer on lisinopril and has no intentions on taking it again at this time.  Request was denied.Despina Hidden Cassady1/30/20199:42 AM

## 2017-04-30 NOTE — Telephone Encounter (Signed)
Pt said someone call and didn't leave a message. Pt want me to put a message in for the nurse just in case

## 2017-05-15 ENCOUNTER — Other Ambulatory Visit: Payer: Self-pay | Admitting: Internal Medicine

## 2017-05-15 DIAGNOSIS — E785 Hyperlipidemia, unspecified: Principal | ICD-10-CM

## 2017-05-15 DIAGNOSIS — E1169 Type 2 diabetes mellitus with other specified complication: Secondary | ICD-10-CM

## 2017-09-01 ENCOUNTER — Other Ambulatory Visit: Payer: Self-pay | Admitting: Internal Medicine

## 2017-09-01 DIAGNOSIS — M76892 Other specified enthesopathies of left lower limb, excluding foot: Secondary | ICD-10-CM

## 2017-09-05 ENCOUNTER — Encounter (INDEPENDENT_AMBULATORY_CARE_PROVIDER_SITE_OTHER): Payer: Self-pay

## 2017-09-05 ENCOUNTER — Encounter: Payer: Self-pay | Admitting: Internal Medicine

## 2017-09-05 ENCOUNTER — Other Ambulatory Visit: Payer: Self-pay

## 2017-09-05 ENCOUNTER — Ambulatory Visit (INDEPENDENT_AMBULATORY_CARE_PROVIDER_SITE_OTHER): Payer: Medicare Other | Admitting: Internal Medicine

## 2017-09-05 VITALS — BP 154/82 | HR 65 | Temp 98.2°F | Ht 62.0 in | Wt 235.5 lb

## 2017-09-05 DIAGNOSIS — E785 Hyperlipidemia, unspecified: Secondary | ICD-10-CM

## 2017-09-05 DIAGNOSIS — Z79899 Other long term (current) drug therapy: Secondary | ICD-10-CM

## 2017-09-05 DIAGNOSIS — E119 Type 2 diabetes mellitus without complications: Secondary | ICD-10-CM

## 2017-09-05 DIAGNOSIS — I10 Essential (primary) hypertension: Secondary | ICD-10-CM

## 2017-09-05 LAB — POCT GLYCOSYLATED HEMOGLOBIN (HGB A1C): Hemoglobin A1C: 5.9 % — AB (ref 4.0–5.6)

## 2017-09-05 LAB — GLUCOSE, CAPILLARY: GLUCOSE-CAPILLARY: 99 mg/dL (ref 65–99)

## 2017-09-05 MED ORDER — AMLODIPINE BESYLATE 5 MG PO TABS: 5.0000 mg | ORAL_TABLET | Freq: Every day | ORAL | 1 refills | Status: DC

## 2017-09-05 NOTE — Patient Instructions (Signed)
It was a pleasure to see you today Ms. [name]. Please make the following changes:  Your blood pressure during this visit was high. Please start taking amlodipine 5mg  once daily.  Follow up with eye doctor Follow up with me in 3 months  If you have any questions or concerns, please call our clinic at [phone] between 9am-5pm and after hours call [phone] and ask for the internal medicine resident on call. If you feel you are having a medical emergency please call 911.   Thank you, we look forward to help you remain healthy!  Lars Mage, MD Internal Medicine PGY1

## 2017-09-05 NOTE — Progress Notes (Signed)
   CC: Diabetes Mellitus follow up  HPI:  [name] is a 62 y.o. with diabetes mellitus, hyperlipidemia, and essential hypertension who presents for diabetes mellitus follow up. Please see problem based charting for evaluation, assessment, and plan.   Past Medical History:  Diagnosis Date  . Diabetes mellitus 2007  . Hyperlipidemia   . Hyperplastic colon polyp 12-2007   Dr. Ardis Hughs  . Hypertension   . Internal and external hemorrhoids without complication 8/92/1194   Review of Systems:    States that she does not have headache, nausea/vomiting, abdominal pain, shortness of breath  Physical Exam:  Vitals:   09/05/17 1431  BP: (!) 154/82  Pulse: 65  Temp: 98.2 F (36.8 C)  TempSrc: Oral  SpO2: 100%  Weight: 235 lb 8 oz (106.8 kg)  Height: 5\' 2"  (1.575 m)   Physical Exam  Constitutional: She appears well-developed and well-nourished. No distress.  HENT:  Head: Normocephalic and atraumatic.  Eyes: Conjunctivae are normal.  Cardiovascular: Normal rate, regular rhythm and normal heart sounds.  Respiratory: Effort normal and breath sounds normal. No respiratory distress. She has no wheezes.  GI: Soft. Bowel sounds are normal. She exhibits no distension. There is no tenderness.  Musculoskeletal: She exhibits no edema.  Neurological: She is alert.  Skin: She is not diaphoretic. No erythema.  Psychiatric: She has a normal mood and affect. Her behavior is normal. Judgment and thought content normal.     Assessment & Plan:   See Encounters Tab for problem based charting.  Controlled Diabetes Mellitus Type 2 The patient is not on any antihyperglycemic agents currently. She had a1c levels ranging 6.6-7.2 in 2015-2017 therefore she is a well-controlled diabetic with diet and exercise. Her last A1c in November 2018 was 6.4.  The patient states that she has not checked her blood glucose at home.  The patient is not currently on any medication. She has gained 4lbs since  previous visit in Jan 2019.   Assessment and plan -Advised patient to check blood glucose levels at home -Encouraged patient to maintain healthy lifestyle with good diet and exercise.   Essential Hypertension The patient's blood pressure during this visit was 154/82.  The patient previously stopped taking lisinopril in January 2019 due to concern that lisinopril will cause liver cancer.  The patient was explained that there are no studies showing this to be the case.  However, the patient continued to not be on any antihyperglycemic agents.  Assessment and plan The patient's blood pressure  is not controlled without any antihyperglycemic agents.  Therefore, the patient was started on amlodipine.  -Started amlodipine 5 mg daily -Follow-up in 3 months -Encourage patient to take home blood pressure readings  Health Maintenance Referred patient for yearly ophthalmology visit due to con commitment well-controlled diabetes   Patient discussed with Dr. Evette Doffing

## 2017-09-08 NOTE — Addendum Note (Signed)
Addended by: Lalla Brothers T on: 09/08/2017 03:13 PM   Modules accepted: Level of Service

## 2017-09-08 NOTE — Progress Notes (Signed)
Internal Medicine Clinic Attending  Case discussed with Dr. Chundi at the time of the visit.  We reviewed the resident's history and exam and pertinent patient test results.  I agree with the assessment, diagnosis, and plan of care documented in the resident's note. 

## 2017-10-13 LAB — HM DIABETES EYE EXAM

## 2017-10-14 ENCOUNTER — Encounter: Payer: Self-pay | Admitting: *Deleted

## 2017-11-04 DIAGNOSIS — H25011 Cortical age-related cataract, right eye: Secondary | ICD-10-CM | POA: Diagnosis not present

## 2017-11-04 DIAGNOSIS — H2511 Age-related nuclear cataract, right eye: Secondary | ICD-10-CM | POA: Diagnosis not present

## 2017-12-03 DIAGNOSIS — H25012 Cortical age-related cataract, left eye: Secondary | ICD-10-CM | POA: Diagnosis not present

## 2017-12-03 DIAGNOSIS — H2512 Age-related nuclear cataract, left eye: Secondary | ICD-10-CM | POA: Diagnosis not present

## 2017-12-05 ENCOUNTER — Encounter: Payer: Medicare Other | Admitting: Internal Medicine

## 2017-12-09 DIAGNOSIS — H25812 Combined forms of age-related cataract, left eye: Secondary | ICD-10-CM | POA: Diagnosis not present

## 2017-12-09 DIAGNOSIS — H2512 Age-related nuclear cataract, left eye: Secondary | ICD-10-CM | POA: Diagnosis not present

## 2017-12-12 ENCOUNTER — Encounter: Payer: Self-pay | Admitting: Internal Medicine

## 2017-12-12 ENCOUNTER — Encounter (INDEPENDENT_AMBULATORY_CARE_PROVIDER_SITE_OTHER): Payer: Self-pay

## 2017-12-12 ENCOUNTER — Other Ambulatory Visit: Payer: Self-pay

## 2017-12-12 ENCOUNTER — Ambulatory Visit (INDEPENDENT_AMBULATORY_CARE_PROVIDER_SITE_OTHER): Payer: Medicare Other | Admitting: Internal Medicine

## 2017-12-12 VITALS — BP 127/70 | HR 68 | Temp 98.6°F | Ht 62.0 in | Wt 234.1 lb

## 2017-12-12 DIAGNOSIS — E119 Type 2 diabetes mellitus without complications: Secondary | ICD-10-CM

## 2017-12-12 DIAGNOSIS — E785 Hyperlipidemia, unspecified: Secondary | ICD-10-CM

## 2017-12-12 DIAGNOSIS — Z79899 Other long term (current) drug therapy: Secondary | ICD-10-CM

## 2017-12-12 DIAGNOSIS — Z23 Encounter for immunization: Secondary | ICD-10-CM

## 2017-12-12 DIAGNOSIS — Z0000 Encounter for general adult medical examination without abnormal findings: Secondary | ICD-10-CM

## 2017-12-12 DIAGNOSIS — E1169 Type 2 diabetes mellitus with other specified complication: Secondary | ICD-10-CM | POA: Diagnosis not present

## 2017-12-12 DIAGNOSIS — I10 Essential (primary) hypertension: Secondary | ICD-10-CM | POA: Diagnosis not present

## 2017-12-12 DIAGNOSIS — Z8673 Personal history of transient ischemic attack (TIA), and cerebral infarction without residual deficits: Secondary | ICD-10-CM

## 2017-12-12 DIAGNOSIS — R109 Unspecified abdominal pain: Secondary | ICD-10-CM | POA: Diagnosis not present

## 2017-12-12 LAB — GLUCOSE, CAPILLARY: Glucose-Capillary: 122 mg/dL — ABNORMAL HIGH (ref 70–99)

## 2017-12-12 LAB — POCT GLYCOSYLATED HEMOGLOBIN (HGB A1C): Hemoglobin A1C: 5.9 % — AB (ref 4.0–5.6)

## 2017-12-12 MED ORDER — AMLODIPINE BESY-BENAZEPRIL HCL 5-10 MG PO CAPS: 1.0000 | ORAL_CAPSULE | Freq: Every day | ORAL | 1 refills | Status: DC

## 2017-12-12 MED ORDER — ATORVASTATIN CALCIUM 40 MG PO TABS: 40.0000 mg | ORAL_TABLET | Freq: Every day | ORAL | 1 refills | Status: DC

## 2017-12-12 MED ORDER — AMLODIPINE BESYLATE 5 MG PO TABS: 5.0000 mg | ORAL_TABLET | Freq: Every day | ORAL | 1 refills | Status: DC

## 2017-12-12 NOTE — Assessment & Plan Note (Signed)
Influenza vaccine given Colonoscopy ordered The patient was being followed by Dr. Benjie Karvonen for high risk HPV test.  However, Dr. Benjie Karvonen stated that she can no longer see the patient due to an insurance change.  I am re-referring the patient to gynecology that she can see with her current insurance.

## 2017-12-12 NOTE — Patient Instructions (Addendum)
It was a pleasure to see you today Ms. Leandro. Please make the following changes:  Please continue exercising daily and I hope you are able to find transportation to the gym so you can enjoy your workout!  Your blood pressure was elevated at this visit and due to that reason I would like for you to take a medication called lotrel that combines amlodipine with another blood pressure medication called benazepril. Please stop taking the amlodipine.   I have sent a referral for you to get a colonoscopy  If you have any questions or concerns, please call our clinic at [phone] between 9am-5pm and after hours call [phone] and ask for the internal medicine resident on call. If you feel you are having a medical emergency please call 911.   Thank you, we look forward to help you remain healthy!  Lars Mage, MD Internal Medicine PGY2

## 2017-12-12 NOTE — Assessment & Plan Note (Signed)
The patient's last lipid panel was done in May 2017 which showed tcholes= 178, ldl= 110, hdl=54, trigl=69.  The patient patient that she stopped taking her atorvastatin unknown reasons.  Assessment and plan The patient's ascvd risk score is 8.8% risk of cardiovascular event coronary or stroke in the next 10 years.Counseled the patient on remembering to take atorvastatin 40 mg daily.

## 2017-12-12 NOTE — Assessment & Plan Note (Signed)
The patient's blood pressure during this visit was 140/82 with repeat reading being 127/70. The patient is currently taking amlodipine 5mg  qd. Her last blood pressure visits are  BP Readings from Last 3 Encounters:  12/12/17 127/70  09/05/17 (!) 154/82  04/04/17 127/63   The patient does not report palpitations, dizziness, chest pain, sob.  Assessment and Plan Per the AHA/ACA guidelines diabetic patient should have bp<130/80. The patient is currently not at goal.  Therefore, we will start the patient on an ACE inhibitor to help prevent restructuring of her heart and also be neuroprotective.  Started patient on a combination pill called Lotrel which combines amlodipine and benazepril 5-10 mg daily.

## 2017-12-12 NOTE — Progress Notes (Signed)
   CC: Diabetes Mellitus Follow up  HPI:  [name] is a 62 y.o. female with essential hypertension, diabetes mellitus type 2 without any current complications, hx of prior tia, and hyperlipidemia who presents for diabetes mellitus follow up. Please see problem based charting for evaluation, assessment, and plan.   Past Medical History:  Diagnosis Date  . Diabetes mellitus 2007  . Hyperlipidemia   . Hyperplastic colon polyp 12-2007   Dr. Ardis Hughs  . Hypertension   . Internal and external hemorrhoids without complication 11/30/5174   Review of Systems:    Review of Systems  Constitutional: Negative for weight loss.  Eyes: Negative for blurred vision.  Respiratory: Negative for shortness of breath.   Cardiovascular: Negative for chest pain.  Gastrointestinal: Positive for abdominal pain. Negative for nausea and vomiting.  Genitourinary: Negative for dysuria and hematuria.  Neurological: Negative for dizziness and tingling.   Physical Exam:  Vitals:   12/12/17 1404  BP: 140/82  Pulse: 76  Temp: 98.6 F (37 C)  TempSrc: Oral  SpO2: 99%  Weight: 234 lb 1.6 oz (106.2 kg)  Height: 5\' 2"  (1.575 m)    Physical Exam  Constitutional: She appears well-developed and well-nourished. No distress.  HENT:  Head: Normocephalic and atraumatic.  Eyes: Conjunctivae are normal.  Cardiovascular: Normal rate, regular rhythm and normal heart sounds.  Respiratory: Effort normal and breath sounds normal. No respiratory distress. She has no wheezes.  GI: Soft. Bowel sounds are normal. She exhibits no distension. There is no tenderness.  Musculoskeletal: She exhibits no edema.  Neurological: She is alert.  Skin: She is not diaphoretic. No erythema.  Psychiatric: She has a normal mood and affect. Her behavior is normal. Judgment and thought content normal.    Assessment & Plan:   See Encounters Tab for problem based charting.  Patient discussed with Dr. Rebeca Alert

## 2017-12-12 NOTE — Assessment & Plan Note (Addendum)
The patient has had prior elevations in A1C >6.5 in the past, however over the past year her a1c has remained below 6.5. Her A1C during this visit was 5.9 unchanged from prior a1c in June 2019.  The patient is currently not on any medication. The patient's home blood glucose measurements over the past month have ranged 115-120s. The patient does/does not note episodes of hypoglycemia. No significant weight changes since June 2019.  The patient's creatinine has been ranging 0.7-0.9 with gfr ranging 80->90 for the past 5 years. The patient is being followed with ophthalmology regularly and she has not had any noted diabetic retinopathy.  Assessment and plan The patient's A1c is at goal with lifestyle modifications.  Counseled the patient on continuing to eat healthy and remain active.  She stated that she likes working out at the gym and her son is planning on taking her regularly.  Her lifestyle modification should help continue keep the patient at goal and should also assist with her other comorbid conditions.  Urine microalbumin to evaluate for proteinuria and assess for diabetic nephropathy.

## 2017-12-13 LAB — MICROALBUMIN / CREATININE URINE RATIO
Creatinine, Urine: 146 mg/dL
Microalb/Creat Ratio: 4.3 mg/g creat (ref 0.0–30.0)
Microalbumin, Urine: 6.3 ug/mL

## 2017-12-15 NOTE — Progress Notes (Signed)
Internal Medicine Clinic Attending  Case discussed with Dr. Chundi at the time of the visit.  We reviewed the resident's history and exam and pertinent patient test results.  I agree with the assessment, diagnosis, and plan of care documented in the resident's note.  Alexander Raines, M.D., Ph.D.  

## 2017-12-16 ENCOUNTER — Telehealth: Payer: Self-pay | Admitting: *Deleted

## 2017-12-16 NOTE — Telephone Encounter (Signed)
Received fax from [facility] with the following: We noticed your patient has multiple rescue inhaler fills without filling a controller medication in the last 180 days. If it is appropriate to start a daily asthma controller therapy, please send a new prescription. Hubbard Hartshorn, RN, BSN

## 2017-12-17 NOTE — Telephone Encounter (Signed)
I attempted to call patient to inquire about her numerous refills. Unfortunately, was not able to get in touch with her. Will try to call again. If she truly is requiring excess doses of albuterol (rescue inhaler) a clinic appointment should be scheduled. Thank you!

## 2017-12-24 ENCOUNTER — Encounter: Payer: Self-pay | Admitting: Gastroenterology

## 2017-12-25 ENCOUNTER — Telehealth: Payer: Self-pay | Admitting: Internal Medicine

## 2018-01-08 ENCOUNTER — Ambulatory Visit: Payer: Medicare Other | Admitting: Obstetrics and Gynecology

## 2018-01-08 NOTE — Addendum Note (Signed)
Addended by: Hulan Fray on: 01/08/2018 06:19 PM   Modules accepted: Orders

## 2018-01-20 DIAGNOSIS — Z1231 Encounter for screening mammogram for malignant neoplasm of breast: Secondary | ICD-10-CM | POA: Diagnosis not present

## 2018-02-11 ENCOUNTER — Encounter: Payer: Medicare Other | Admitting: Gastroenterology

## 2018-03-17 ENCOUNTER — Encounter: Payer: Self-pay | Admitting: Gastroenterology

## 2018-04-09 ENCOUNTER — Telehealth: Payer: Self-pay | Admitting: Gastroenterology

## 2018-04-09 ENCOUNTER — Encounter: Payer: Self-pay | Admitting: Gastroenterology

## 2018-04-29 ENCOUNTER — Ambulatory Visit (AMBULATORY_SURGERY_CENTER): Payer: Self-pay | Admitting: *Deleted

## 2018-04-29 ENCOUNTER — Encounter (INDEPENDENT_AMBULATORY_CARE_PROVIDER_SITE_OTHER): Payer: Self-pay

## 2018-04-29 VITALS — Ht 61.0 in | Wt 232.4 lb

## 2018-04-29 DIAGNOSIS — Z1211 Encounter for screening for malignant neoplasm of colon: Secondary | ICD-10-CM

## 2018-04-29 MED ORDER — PEG 3350-KCL-NA BICARB-NACL 420 G PO SOLR: 4000.0000 mL | Freq: Once | ORAL | 0 refills | Status: AC

## 2018-04-29 NOTE — Progress Notes (Signed)
Patient denies any allergies to eggs or soy. Patient denies any problems with anesthesia/sedation. Patient denies any oxygen use at home. Patient denies taking any diet/weight loss medications or blood thinners. EMMI education offered. Pt declined, no email per pt. 2day prep given to pt due to constipation.

## 2018-05-04 ENCOUNTER — Encounter: Payer: Self-pay | Admitting: Gastroenterology

## 2018-05-13 ENCOUNTER — Ambulatory Visit (AMBULATORY_SURGERY_CENTER): Payer: Medicare Other | Admitting: Gastroenterology

## 2018-05-13 ENCOUNTER — Encounter: Payer: Self-pay | Admitting: Gastroenterology

## 2018-05-13 VITALS — BP 138/82 | HR 65 | Temp 95.3°F | Resp 16 | Ht 61.0 in | Wt 232.0 lb

## 2018-05-13 DIAGNOSIS — D123 Benign neoplasm of transverse colon: Secondary | ICD-10-CM

## 2018-05-13 DIAGNOSIS — Z1211 Encounter for screening for malignant neoplasm of colon: Secondary | ICD-10-CM

## 2018-05-13 MED ORDER — SODIUM CHLORIDE 0.9 % IV SOLN: 500.0000 mL | Freq: Once | INTRAVENOUS | Status: AC

## 2018-05-13 NOTE — Op Note (Signed)
[facility] Patient Name: [name] Procedure Date: 05/13/2018 10:28 AM MRN: [id number] Endoscopist: Milus Banister , MD Age: 63 Referring MD:  Date of Birth: Jun 02, 1955 Gender: Female Account #: [id number] Procedure:                Colonoscopy Indications:              Screening for colorectal malignant neoplasm Medicines:                Monitored Anesthesia Care Procedure:                Pre-Anesthesia Assessment:                           - Prior to the procedure, a History and Physical                            was performed, and patient medications and                            allergies were reviewed. The patient's tolerance of                            previous anesthesia was also reviewed. The risks                            and benefits of the procedure and the sedation                            options and risks were discussed with the patient.                            All questions were answered, and informed consent                            was obtained. Prior Anticoagulants: The patient has                            taken no previous anticoagulant or antiplatelet                            agents. ASA Grade Assessment: II - A patient with                            mild systemic disease. After reviewing the risks                            and benefits, the patient was deemed in                            satisfactory condition to undergo the procedure.                           After obtaining informed consent, the colonoscope  was passed under direct vision. Throughout the                            procedure, the patient's blood pressure, pulse, and                            oxygen saturations were monitored continuously. The                            Colonoscope was introduced through the anus and                            advanced to the the cecum, identified by                            appendiceal orifice and  ileocecal valve. The                            colonoscopy was performed without difficulty. The                            patient tolerated the procedure well. The quality                            of the bowel preparation was good. The ileocecal                            valve, appendiceal orifice, and rectum were                            photographed. Scope In: 10:39:56 AM Scope Out: 10:52:51 AM Scope Withdrawal Time: 0 hours 10 minutes 35 seconds  Total Procedure Duration: 0 hours 12 minutes 55 seconds  Findings:                 A 2 mm polyp was found in the transverse colon. The                            polyp was sessile. The polyp was removed with a                            cold snare. Resection and retrieval were complete.                           External and internal hemorrhoids were found. The                            hemorrhoids were small.                           The exam was otherwise without abnormality on                            direct and retroflexion views. Complications:  No immediate complications. Estimated blood loss:                            None. Estimated Blood Loss:     Estimated blood loss: none. Impression:               - One 2 mm polyp in the transverse colon, removed                            with a cold snare. Resected and retrieved.                           - External and internal hemorrhoids.                           - The examination was otherwise normal on direct                            and retroflexion views. Recommendation:           - Patient has a contact number available for                            emergencies. The signs and symptoms of potential                            delayed complications were discussed with the                            patient. Return to normal activities tomorrow.                            Written discharge instructions were provided to the                            patient.                            - Resume previous diet.                           - Continue present medications.                           You will receive a letter within 2-3 weeks with the                            pathology results and my final recommendations.                           If the polyp(s) is proven to be 'pre-cancerous' on                            pathology, you will need repeat colonoscopy in 5  years. If the polyp(s) is NOT 'precancerous' on                            pathology then you should repeat colon cancer                            screening in 10 years with colonoscopy without need                            for colon cancer screening by any method prior to                            then (including stool testing). Milus Banister, MD 05/13/2018 10:55:55 AM This report has been signed electronically.

## 2018-05-13 NOTE — Patient Instructions (Signed)
YOU HAD AN ENDOSCOPIC PROCEDURE TODAY AT [facility]:   Refer to the procedure report that was given to you for any specific questions about what was found during the examination.  If the procedure report does not answer your questions, please call your gastroenterologist to clarify.  If you requested that your care partner not be given the details of your procedure findings, then the procedure report has been included in a sealed envelope for you to review at your convenience later.  YOU SHOULD EXPECT: Some feelings of bloating in the abdomen. Passage of more gas than usual.  Walking can help get rid of the air that was put into your GI tract during the procedure and reduce the bloating. If you had a lower endoscopy (such as a colonoscopy or flexible sigmoidoscopy) you may notice spotting of blood in your stool or on the toilet paper. If you underwent a bowel prep for your procedure, you may not have a normal bowel movement for a few days.  Please Note:  You might notice some irritation and congestion in your nose or some drainage.  This is from the oxygen used during your procedure.  There is no need for concern and it should clear up in a day or so.  SYMPTOMS TO REPORT IMMEDIATELY:   Following lower endoscopy (colonoscopy or flexible sigmoidoscopy):  Excessive amounts of blood in the stool  Significant tenderness or worsening of abdominal pains  Swelling of the abdomen that is new, acute  Fever of 100F or higher  PLease see handouts given to you on Polyps and Hemorrhoids.  For urgent or emergent issues, a gastroenterologist can be reached at any hour by calling [phone].   DIET:  We do recommend a small meal at first, but then you may proceed to your regular diet.  Drink plenty of fluids but you should avoid alcoholic beverages for 24 hours.  ACTIVITY:  You should plan to take it easy for the rest of today and you should NOT DRIVE or use heavy machinery until tomorrow  (because of the sedation medicines used during the test).    FOLLOW UP: Our staff will call the number listed on your records the next business day following your procedure to check on you and address any questions or concerns that you may have regarding the information given to you following your procedure. If we do not reach you, we will leave a message.  However, if you are feeling well and you are not experiencing any problems, there is no need to return our call.  We will assume that you have returned to your regular daily activities without incident.  If any biopsies were taken you will be contacted by phone or by letter within the next 1-3 weeks.  Please call us at [phone] if you have not heard about the biopsies in 3 weeks.    SIGNATURES/CONFIDENTIALITY: You and/or your care partner have signed paperwork which will be entered into your electronic medical record.  These signatures attest to the fact that that the information above on your After Visit Summary has been reviewed and is understood.  Full responsibility of the confidentiality of this discharge information lies with you and/or your care-partner.   Thank you for letting us take care of your healthcare needs today.

## 2018-05-13 NOTE — Progress Notes (Signed)
Pt's states no medical or surgical changes since previsit or office visit. 

## 2018-05-13 NOTE — Progress Notes (Signed)
Called to room to assist during endoscopic procedure.  Patient ID and intended procedure confirmed with present staff. Received instructions for my participation in the procedure from the performing physician.  

## 2018-05-13 NOTE — Progress Notes (Signed)
Report to PACU, RN, vss, BBS= Clear.  

## 2018-05-14 ENCOUNTER — Telehealth: Payer: Self-pay | Admitting: *Deleted

## 2018-05-14 NOTE — Telephone Encounter (Signed)
  Follow up Call-  Call back number 05/13/2018  Post procedure Call Back phone  # [phone]  Permission to leave phone message Yes  Some recent data might be hidden     Patient questions:  Do you have a fever, pain , or abdominal swelling? No. Pain Score  0 *  Have you tolerated food without any problems? Yes.    Have you been able to return to your normal activities? Yes.    Do you have any questions about your discharge instructions: Diet   No. Medications  No. Follow up visit  No.  Do you have questions or concerns about your Care? No.  Actions: * If pain score is 4 or above: No action needed, pain <4.

## 2018-05-19 ENCOUNTER — Encounter: Payer: Self-pay | Admitting: Gastroenterology

## 2018-06-26 ENCOUNTER — Encounter: Payer: Medicare Other | Admitting: Internal Medicine

## 2018-11-09 NOTE — Progress Notes (Signed)
   CC: Left ankle pain  HPI:  [name] is a 63 y.o. female with essential htn, diabetes mellitus 2, and hld who presents for diabetes follow up. Please see problem based charting for evaluation, assessment, and plan.  Past Medical History:  Diagnosis Date  . Constipation   . Diabetes mellitus 2007   no treatment,no meds at this time per pt  . Hyperlipidemia   . Hyperplastic colon polyp 12-2007   Dr. Ardis Hughs  . Hypertension   . Internal and external hemorrhoids without complication 8/32/5498   Review of Systems:    Review of Systems  Constitutional: Negative for chills and fever.  Respiratory: Negative for cough and shortness of breath.   Cardiovascular: Negative for chest pain.  Gastrointestinal: Negative for abdominal pain, nausea and vomiting.  Musculoskeletal: Positive for joint pain (left knee and left ankle). Negative for myalgias.  Neurological: Negative for dizziness and headaches.  Psychiatric/Behavioral: Negative for depression.   Physical Exam:  Vitals:   11/10/18 1508  BP: 120/79  Pulse: 62  Temp: 98.2 F (36.8 C)  TempSrc: Oral  SpO2: 100%  Weight: 234 lb 11.2 oz (106.5 kg)  Height: 5\' 1"  (1.549 m)   Physical Exam  Constitutional: She is oriented to person, place, and time. She appears well-developed and well-nourished. No distress.  HENT:  Head: Normocephalic and atraumatic.  Eyes: Conjunctivae are normal.  Cardiovascular: Normal rate, regular rhythm and normal heart sounds.  Respiratory: Effort normal and breath sounds normal. No respiratory distress. She has no wheezes.  GI: Soft. Bowel sounds are normal. She exhibits no distension. There is no abdominal tenderness.  Musculoskeletal:        General: Tenderness (left ankle and left knee) present.     Left knee: She exhibits normal range of motion, no swelling, no effusion, no deformity and no erythema. Tenderness found.     Left ankle: She exhibits normal range of motion and no swelling.  Tenderness.  Neurological: She is alert and oriented to person, place, and time.  Skin: She is not diaphoretic. No erythema.  Psychiatric: She has a normal mood and affect. Her behavior is normal. Judgment and thought content normal.   Assessment & Plan:   See Encounters Tab for problem based charting.  Patient discussed with Dr. Lynnae January

## 2018-11-10 ENCOUNTER — Ambulatory Visit (INDEPENDENT_AMBULATORY_CARE_PROVIDER_SITE_OTHER): Payer: Medicare Other | Admitting: Internal Medicine

## 2018-11-10 ENCOUNTER — Other Ambulatory Visit: Payer: Self-pay

## 2018-11-10 ENCOUNTER — Encounter: Payer: Self-pay | Admitting: Internal Medicine

## 2018-11-10 VITALS — BP 120/79 | HR 62 | Temp 98.2°F | Ht 61.0 in | Wt 234.7 lb

## 2018-11-10 DIAGNOSIS — E785 Hyperlipidemia, unspecified: Secondary | ICD-10-CM

## 2018-11-10 DIAGNOSIS — E1169 Type 2 diabetes mellitus with other specified complication: Secondary | ICD-10-CM | POA: Diagnosis not present

## 2018-11-10 DIAGNOSIS — E119 Type 2 diabetes mellitus without complications: Secondary | ICD-10-CM | POA: Diagnosis not present

## 2018-11-10 DIAGNOSIS — M1712 Unilateral primary osteoarthritis, left knee: Secondary | ICD-10-CM | POA: Diagnosis not present

## 2018-11-10 DIAGNOSIS — M25572 Pain in left ankle and joints of left foot: Secondary | ICD-10-CM

## 2018-11-10 DIAGNOSIS — G8929 Other chronic pain: Secondary | ICD-10-CM

## 2018-11-10 DIAGNOSIS — M170 Bilateral primary osteoarthritis of knee: Secondary | ICD-10-CM

## 2018-11-10 DIAGNOSIS — I10 Essential (primary) hypertension: Secondary | ICD-10-CM | POA: Diagnosis not present

## 2018-11-10 DIAGNOSIS — Z79899 Other long term (current) drug therapy: Secondary | ICD-10-CM

## 2018-11-10 LAB — POCT GLYCOSYLATED HEMOGLOBIN (HGB A1C): Hemoglobin A1C: 6 % — AB (ref 4.0–5.6)

## 2018-11-10 LAB — GLUCOSE, CAPILLARY: Glucose-Capillary: 130 mg/dL — ABNORMAL HIGH (ref 70–99)

## 2018-11-10 MED ORDER — DICLOFENAC SODIUM 1 % TD GEL: 2.0000 g | Freq: Three times a day (TID) | TRANSDERMAL | Status: DC | PRN

## 2018-11-10 NOTE — Patient Instructions (Addendum)
It was a pleasure to see you today [name]. Please make the following changes:  -please continue taking your medication  -I have ordered xrays to evaluate that pain in your left knee and left ankle  If you have any questions or concerns, please call our clinic at [phone] between 9am-5pm and after hours call [phone] and ask for the internal medicine resident on call. If you feel you are having a medical emergency please call 911.   Thank you, we look forward to help you remain healthy!  Alyssa Mage, MD Internal Medicine PGY3

## 2018-11-11 DIAGNOSIS — M25572 Pain in left ankle and joints of left foot: Secondary | ICD-10-CM | POA: Insufficient documentation

## 2018-11-11 LAB — BMP8+ANION GAP
Anion Gap: 16 mmol/L (ref 10.0–18.0)
BUN/Creatinine Ratio: 11 — ABNORMAL LOW (ref 12–28)
BUN: 7 mg/dL — ABNORMAL LOW (ref 8–27)
CO2: 25 mmol/L (ref 20–29)
Calcium: 9.4 mg/dL (ref 8.7–10.3)
Chloride: 100 mmol/L (ref 96–106)
Creatinine, Ser: 0.66 mg/dL (ref 0.57–1.00)
GFR calc Af Amer: 109 mL/min/{1.73_m2} (ref >59)
GFR calc non Af Amer: 94 mL/min/{1.73_m2} (ref >59)
Glucose: 124 mg/dL — ABNORMAL HIGH (ref 65–99)
Potassium: 4.2 mmol/L (ref 3.5–5.2)
Sodium: 141 mmol/L (ref 134–144)

## 2018-11-11 NOTE — Assessment & Plan Note (Addendum)
Patient states that she has left ankle pain for the past 1 month. She describes the pain as 5/10 intensity, achy in nature, intermittently present lasting 2-3 hrs each time. She denies any trauma to ankle. She states that she ordered some gel from her medication catalog which has been helpful in alleviating her pain.   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  She has an elevated bmi of 44 and she already has a history of left knee oa as well.   -recommended patient to work on weight loss  -ordered left ankle xray -ordered voltaren gel to help alleviate pain

## 2018-11-11 NOTE — Assessment & Plan Note (Addendum)
The patient's blood pressure during this visit was 120/79. The patient is currently taking amlodipine-benazepril 5-10mg  qd . His last blood pressure visits are   BP Readings from Last 3 Encounters:  11/10/18 120/79  05/13/18 138/82  12/12/17 127/70  The patient does not report palpitations, dizziness, chest pain, sob.  Assessment and Plan Patient's blood pressure is well controlled. Will continue on current regimen as above. Ordered bmp.

## 2018-11-11 NOTE — Assessment & Plan Note (Signed)
The patient's last a1c=5.9 in September 2019 and at this visit is 6.0.  The patient is currently not on any antihyperglycemic medications. She has gained 2lbs since last visit in February 2020. She was seen by ophthalmology dr hecker  Assessment and plan  Advised patient to continue healthy lifestyle free of excess sugars and to exercise regularly.

## 2018-11-11 NOTE — Assessment & Plan Note (Signed)
Patient has not completed left knee imaging that was ordered at previous visit. The imaging was re-ordered to further characterize joint as last films were from 2011.

## 2018-11-11 NOTE — Assessment & Plan Note (Signed)
Continue atorvastatin 40mg  qd

## 2018-11-13 NOTE — Addendum Note (Signed)
Addended by: Lars Mage on: 11/13/2018 02:19 PM   Modules accepted: Level of Service

## 2018-11-13 NOTE — Progress Notes (Signed)
Internal Medicine Clinic Attending  Case discussed with Dr. Chundi at the time of the visit.  We reviewed the resident's history and exam and pertinent patient test results.  I agree with the assessment, diagnosis, and plan of care documented in the resident's note. 

## 2018-11-15 ENCOUNTER — Encounter (HOSPITAL_COMMUNITY): Payer: Self-pay

## 2018-11-15 ENCOUNTER — Emergency Department (HOSPITAL_COMMUNITY)
Admission: EM | Admit: 2018-11-15 | Discharge: 2018-11-16 | Disposition: A | Discharge status: Home / Self Care | Payer: Medicare Other | Attending: Emergency Medicine | Admitting: Emergency Medicine

## 2018-11-15 ENCOUNTER — Other Ambulatory Visit: Payer: Self-pay

## 2018-11-15 DIAGNOSIS — R1084 Generalized abdominal pain: Secondary | ICD-10-CM | POA: Insufficient documentation

## 2018-11-15 DIAGNOSIS — R1030 Lower abdominal pain, unspecified: Secondary | ICD-10-CM | POA: Diagnosis not present

## 2018-11-15 DIAGNOSIS — E119 Type 2 diabetes mellitus without complications: Secondary | ICD-10-CM | POA: Diagnosis not present

## 2018-11-15 DIAGNOSIS — R112 Nausea with vomiting, unspecified: Secondary | ICD-10-CM | POA: Diagnosis not present

## 2018-11-15 DIAGNOSIS — R1111 Vomiting without nausea: Secondary | ICD-10-CM | POA: Diagnosis not present

## 2018-11-15 DIAGNOSIS — Z79899 Other long term (current) drug therapy: Secondary | ICD-10-CM | POA: Insufficient documentation

## 2018-11-15 DIAGNOSIS — K5900 Constipation, unspecified: Secondary | ICD-10-CM

## 2018-11-15 DIAGNOSIS — I10 Essential (primary) hypertension: Secondary | ICD-10-CM | POA: Diagnosis not present

## 2018-11-15 DIAGNOSIS — D3001 Benign neoplasm of right kidney: Secondary | ICD-10-CM | POA: Diagnosis not present

## 2018-11-15 DIAGNOSIS — R1031 Right lower quadrant pain: Secondary | ICD-10-CM | POA: Diagnosis not present

## 2018-11-15 DIAGNOSIS — R109 Unspecified abdominal pain: Secondary | ICD-10-CM

## 2018-11-15 LAB — URINALYSIS, ROUTINE W REFLEX MICROSCOPIC
Bacteria, UA: NONE SEEN
Bilirubin Urine: NEGATIVE
Glucose, UA: NEGATIVE mg/dL
Ketones, ur: NEGATIVE mg/dL
Nitrite: NEGATIVE
Protein, ur: NEGATIVE mg/dL
Specific Gravity, Urine: 1.008 (ref 1.005–1.030)
pH: 5 (ref 5.0–8.0)

## 2018-11-15 LAB — CBC
HCT: 42.1 % (ref 36.0–46.0)
Hemoglobin: 13.7 g/dL (ref 12.0–15.0)
MCH: 30.6 pg (ref 26.0–34.0)
MCHC: 32.5 g/dL (ref 30.0–36.0)
MCV: 94.2 fL (ref 80.0–100.0)
Platelets: 249 10*3/uL (ref 150–400)
RBC: 4.47 MIL/uL (ref 3.87–5.11)
RDW: 13.4 % (ref 11.5–15.5)
WBC: 7.1 10*3/uL (ref 4.0–10.5)
nRBC: 0 % (ref 0.0–0.2)

## 2018-11-15 MED ORDER — ONDANSETRON HCL 4 MG/2ML IJ SOLN
4.0000 mg | Freq: Once | INTRAMUSCULAR | Status: AC
  Administered 2018-11-16: 4 mg via INTRAVENOUS
  Filled 2018-11-15: qty 2

## 2018-11-15 MED ORDER — SODIUM CHLORIDE 0.9 % IV BOLUS
1000.0000 mL | Freq: Once | INTRAVENOUS | Status: AC
  Administered 2018-11-16: 1000 mL via INTRAVENOUS

## 2018-11-15 MED ORDER — FENTANYL CITRATE (PF) 100 MCG/2ML IJ SOLN
50.0000 ug | Freq: Once | INTRAMUSCULAR | Status: AC
  Administered 2018-11-16: 50 ug via INTRAVENOUS
  Filled 2018-11-15: qty 2

## 2018-11-15 NOTE — ED Notes (Signed)
Pt ambulated to bathroom with limited to no assistance from tech

## 2018-11-15 NOTE — ED Triage Notes (Signed)
Patient coming from home with complaints of lower abdominal pain that happened suddenly and is described as sharp. Pain started around 2200. Patient endorses nausea and vomiting today. Last BM was Friday.

## 2018-11-16 ENCOUNTER — Emergency Department (HOSPITAL_COMMUNITY): Payer: Medicare Other

## 2018-11-16 DIAGNOSIS — R1084 Generalized abdominal pain: Secondary | ICD-10-CM | POA: Diagnosis not present

## 2018-11-16 DIAGNOSIS — D3001 Benign neoplasm of right kidney: Secondary | ICD-10-CM | POA: Diagnosis not present

## 2018-11-16 LAB — LIPASE, BLOOD: Lipase: 30 U/L (ref 11–51)

## 2018-11-16 LAB — COMPREHENSIVE METABOLIC PANEL
ALT: 18 U/L (ref 0–44)
AST: 26 U/L (ref 15–41)
Albumin: 4.4 g/dL (ref 3.5–5.0)
Alkaline Phosphatase: 45 U/L (ref 38–126)
Anion gap: 8 (ref 5–15)
BUN: 13 mg/dL (ref 8–23)
CO2: 28 mmol/L (ref 22–32)
Calcium: 9.3 mg/dL (ref 8.9–10.3)
Chloride: 104 mmol/L (ref 98–111)
Creatinine, Ser: 0.77 mg/dL (ref 0.44–1.00)
GFR calc Af Amer: >60 mL/min (ref >60)
GFR calc non Af Amer: >60 mL/min (ref >60)
Glucose, Bld: 119 mg/dL — ABNORMAL HIGH (ref 70–99)
Potassium: 3.6 mmol/L (ref 3.5–5.1)
Sodium: 140 mmol/L (ref 135–145)
Total Bilirubin: 0.2 mg/dL — ABNORMAL LOW (ref 0.3–1.2)
Total Protein: 7.6 g/dL (ref 6.5–8.1)

## 2018-11-16 NOTE — ED Notes (Signed)
Patient transported to CT 

## 2018-11-16 NOTE — ED Provider Notes (Signed)
[facility] DEPT Provider Note   CSN: [id number] Arrival date & time: 11/15/18  2252  Time seen 11:48 PM  History   Chief Complaint Chief Complaint  Patient presents with   Abdominal Pain    HPI [name] is a 63 y.o. female.     HPI patient states she ate about 2 PM, she ate neck bones, cabbage, rice, and cornbread which she cooked herself and she has had before without problems.  About 9:30 PM she was sitting outside and had acute onset of diffuse abdominal pain.  She states it is hard to describe.  She states it started suddenly and was not gradually occurring.  She states it has been there constantly since it started.  Increases with movement and urination and nothing makes it feel better.  She has dysuria without hematuria.  She states she chronically has frequency and urgency.  She states she had nausea and vomiting once however that was this afternoon after eating and before her pain started.  She denies diarrhea, feeling bloated.  She denies having any prior abdominal surgeries, she has never had this pain before.  She states she is never had a.  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  Her husband ate the same food and feels fine.  PCP Lars Mage, MD   Past Medical History:  Diagnosis Date   Constipation    Diabetes mellitus 2007   no treatment,no meds at this time per pt   Hyperlipidemia    Hyperplastic colon polyp 12-2007   Dr. Ardis Hughs   Hypertension    Internal and external hemorrhoids without complication 07/01/270    Patient Active Problem List   Diagnosis Date Noted   Left ankle pain 11/11/2018   Shortness of breath 04/05/2017   Healthcare maintenance 04/04/2017   Cervical high risk HPV (human papillomavirus) test positive 09/29/2015   History of TIA (transient ischemic attack) 03/03/2014   Morbid obesity with BMI of 45.0-49.9, adult ([place]) 07/31/2012   Memory impairment 05/04/2012    Osteoarthritis of left knee 07/26/2008   Internal and external hemorrhoids without complication 53/66/4403   Hyperlipidemia associated with type 2 diabetes mellitus ([place]) 10/05/2007   Diabetes mellitus type 2, controlled, without complications ([place]) 47/42/5956   Essential hypertension 01/14/2006    Past Surgical History:  Procedure Laterality Date   CATARACT EXTRACTION, BILATERAL  2019   COLONOSCOPY  12/23/2007   TRANSPHENOIDAL / TRANSNASAL HYPOPHYSECTOMY / RESECTION PITUITARY TUMOR  at age 6     OB History   No obstetric history on file.      Home Medications    Prior to Admission medications   Medication Sig Start Date End Date Taking? Authorizing Provider  amLODipine-benazepril (LOTREL) 5-10 MG capsule Take 1 capsule by mouth daily. 12/12/17 05/13/18  Lars Mage, MD  atorvastatin (LIPITOR) 40 MG tablet Take 1 tablet (40 mg total) by mouth daily. 12/12/17 05/13/18  Chundi, Verne Spurr, MD  Blood Glucose Monitoring Suppl (ONETOUCH VERIO) w/Device KIT 1 Units by Does not apply route as needed. 05/17/16   Riccardo Dubin, MD  diclofenac sodium (VOLTAREN) 1 % GEL Apply 2 g topically every 8 (eight) hours as needed. 11/10/18   Chundi, Verne Spurr, MD  glucose blood (ONETOUCH VERIO) test strip Use as instructed 05/17/16   Riccardo Dubin, MD  ibuprofen (ADVIL,MOTRIN) 800 MG tablet Take 1 tablet (800 mg total) every 8 (eight) hours as needed by mouth. 02/14/17   Maryellen Pile, MD  [facility]  DELICA LANCETS 50K MISC Please use as directed. 05/17/16   Riccardo Dubin, MD    Family History Family History  Problem Relation Age of Onset   Stroke Mother        age 27   Heart attack Father        age 60   Heart attack Sister        age 41   Colon cancer Maternal Aunt        ?pt unsure age maybe 36-70's   Colon polyps Neg Hx    Esophageal cancer Neg Hx    Stomach cancer Neg Hx    Rectal cancer Neg Hx     Social History Social History   Tobacco Use   Smoking status: Never  Smoker   Smokeless tobacco: Never Used  Substance Use Topics   Alcohol use: No    Alcohol/week: 0.0 standard drinks    Frequency: Never    Comment: once a year at Christmas per pt   Drug use: No  lives with spouse   Allergies   Glipizide and Metformin and related   Review of Systems Review of Systems  All other systems reviewed and are negative.    Physical Exam Updated Vital Signs BP 129/71 (BP Location: Right Arm)    Pulse 74    Temp 98.2 F (36.8 C) (Oral)    Resp 18    Ht 5' 1" (1.549 m)    Wt 106.5 kg    SpO2 97%    BMI 44.35 kg/m   Physical Exam Vitals signs and nursing note reviewed.  Constitutional:      General: She is not in acute distress.    Appearance: Normal appearance. She is well-developed. She is not ill-appearing or toxic-appearing.  HENT:     Head: Normocephalic and atraumatic.     Right Ear: External ear normal.     Left Ear: External ear normal.     Nose: Nose normal. No mucosal edema or rhinorrhea.     Mouth/Throat:     Dentition: No dental abscesses.     Pharynx: No uvula swelling.  Eyes:     Conjunctiva/sclera: Conjunctivae normal.     Pupils: Pupils are equal, round, and reactive to light.  Neck:     Musculoskeletal: Full passive range of motion without pain, normal range of motion and neck supple.  Cardiovascular:     Rate and Rhythm: Normal rate and regular rhythm.     Heart sounds: Normal heart sounds. No murmur. No friction rub. No gallop.   Pulmonary:     Effort: Pulmonary effort is normal. No respiratory distress.     Breath sounds: Normal breath sounds. No wheezing, rhonchi or rales.  Chest:     Chest wall: No tenderness or crepitus.  Abdominal:     General: Bowel sounds are decreased. There is distension.     Palpations: Abdomen is soft.     Tenderness: There is abdominal tenderness in the right lower quadrant. There is no guarding or rebound.    Musculoskeletal: Normal range of motion.        General: No tenderness.      Comments: Moves all extremities well.   Skin:    General: Skin is warm and dry.     Coloration: Skin is not pale.     Findings: No erythema or rash.  Neurological:     Mental Status: She is alert and oriented to person, place, and time.  Cranial Nerves: No cranial nerve deficit.  Psychiatric:        Mood and Affect: Mood is not anxious.        Speech: Speech normal.        Behavior: Behavior normal.      ED Treatments / Results  Labs (all labs ordered are listed, but only abnormal results are displayed) Results for orders placed or performed during the hospital encounter of 11/15/18  Lipase, blood  Result Value Ref Range   Lipase 30 11 - 51 U/L  Comprehensive metabolic panel  Result Value Ref Range   Sodium 140 135 - 145 mmol/L   Potassium 3.6 3.5 - 5.1 mmol/L   Chloride 104 98 - 111 mmol/L   CO2 28 22 - 32 mmol/L   Glucose, Bld 119 (H) 70 - 99 mg/dL   BUN 13 8 - 23 mg/dL   Creatinine, Ser 0.77 0.44 - 1.00 mg/dL   Calcium 9.3 8.9 - 10.3 mg/dL   Total Protein 7.6 6.5 - 8.1 g/dL   Albumin 4.4 3.5 - 5.0 g/dL   AST 26 15 - 41 U/L   ALT 18 0 - 44 U/L   Alkaline Phosphatase 45 38 - 126 U/L   Total Bilirubin 0.2 (L) 0.3 - 1.2 mg/dL   GFR calc non Af Amer >60 >60 mL/min   GFR calc Af Amer >60 >60 mL/min   Anion gap 8 5 - 15  CBC  Result Value Ref Range   WBC 7.1 4.0 - 10.5 K/uL   RBC 4.47 3.87 - 5.11 MIL/uL   Hemoglobin 13.7 12.0 - 15.0 g/dL   HCT 42.1 36.0 - 46.0 %   MCV 94.2 80.0 - 100.0 fL   MCH 30.6 26.0 - 34.0 pg   MCHC 32.5 30.0 - 36.0 g/dL   RDW 13.4 11.5 - 15.5 %   Platelets 249 150 - 400 K/uL   nRBC 0.0 0.0 - 0.2 %  Urinalysis, Routine w reflex microscopic  Result Value Ref Range   Color, Urine STRAW (A) YELLOW   APPearance CLEAR CLEAR   Specific Gravity, Urine 1.008 1.005 - 1.030   pH 5.0 5.0 - 8.0   Glucose, UA NEGATIVE NEGATIVE mg/dL   Hgb urine dipstick SMALL (A) NEGATIVE   Bilirubin Urine NEGATIVE NEGATIVE   Ketones, ur NEGATIVE NEGATIVE mg/dL     Protein, ur NEGATIVE NEGATIVE mg/dL   Nitrite NEGATIVE NEGATIVE   Leukocytes,Ua TRACE (A) NEGATIVE   RBC / HPF 0-5 0 - 5 RBC/hpf   WBC, UA 0-5 0 - 5 WBC/hpf   Bacteria, UA NONE SEEN NONE SEEN   Mucus PRESENT    Laboratory interpretation all normal     EKG None  Radiology Ct Renal Stone Study  Result Date: 11/16/2018 CLINICAL DATA:  Initial evaluation for acute lower abdominal pain, nausea, vomiting. EXAM: CT ABDOMEN AND PELVIS WITHOUT CONTRAST TECHNIQUE: Multidetector CT imaging of the abdomen and pelvis was performed following the standard protocol without IV contrast. COMPARISON:  Prior ultrasound from 08/20/2005. FINDINGS: Lower chest: Visualized lung bases are largely clear. Hepatobiliary: Limited noncontrast evaluation of the liver is unremarkable. Gallbladder within normal limits. No biliary dilatation. Pancreas: Pancreas within normal limits. Spleen: Spleen within normal limits. Adrenals/Urinary Tract: Adrenal glands are normal. Kidneys equal in size without in the nephrolithiasis or obstructive uropathy. 2.1 cm lesion within the interpolar right kidney demonstrating macroscopic fat most consistent with an AML. No ureterolithiasis or hydroureter. Partially distended bladder within normal limits. No layering stones within  the bladder lumen. Stomach/Bowel: Small hiatal hernia noted. Stomach otherwise unremarkable. No evidence for bowel obstruction. Normal appendix. No acute inflammatory changes seen about the bowels. Vascular/Lymphatic: Intra-abdominal aorta normal in caliber. No adenopathy. Reproductive: Unremarkable. Other: Trace free fluid within the pelvis, of uncertain etiology or significance. No free intraperitoneal air. Musculoskeletal: No acute osseous abnormality. No discrete lytic or blastic osseous lesions. Chronic grade 1 facet mediated anterolisthesis of L4 on L5, with additional multilevel facet arthrosis noted throughout the lumbar spine. IMPRESSION: 1. No CT evidence for  nephrolithiasis or obstructive uropathy. 2. Trace free fluid within the pelvis, of uncertain etiology or significance. 3. No other acute intra-abdominal or pelvic process identified. 4. 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 Electronically Signed   By: Jeannine Boga M.D.   On: 11/16/2018 02:10    Procedures Procedures (including critical care time)  Medications Ordered in ED Medications  fentaNYL (SUBLIMAZE) injection 50 mcg (50 mcg Intravenous Given 11/16/18 0014)  ondansetron (ZOFRAN) injection 4 mg (4 mg Intravenous Given 11/16/18 0014)  sodium chloride 0.9 % bolus 1,000 mL (0 mLs Intravenous Stopped 11/16/18 0140)     Initial Impression / Assessment and Plan / ED Course  I have reviewed the triage vital signs and the nursing notes.  Pertinent labs & imaging results that were available during my care of the patient were reviewed by me and considered in my medical decision making (see chart for details).   Patient was given IV fluids, IV pain and nausea medication.  CT renal was done.  I discussed with patient her pain could be either a kidney stone, ovarian problem, or possibly appendicitis although the acute onset makes that less likely.    3:20 AM I discussed patient's test results which were all normal.  When I review her CT scan with the patient she has a lot of stool in her right colon and that is where her pain is located.  She states her last bowel movement was August 14 and was only a small amount.  She states she has longstanding constipation.  I think at this point she can go home and treat herself for constipation which should resolve her pain.  Final Clinical Impressions(s) / ED Diagnoses   Final diagnoses:  Abdominal pain, unspecified abdominal location  Constipation, unspecified constipation type    ED Discharge Orders    None    OTC miralax  Plan discharge  Rolland Porter, MD, Barbette Or, MD 11/16/18 [phone]

## 2018-11-16 NOTE — Discharge Instructions (Signed)
Get miralax and put one dose or 17 g in 8 ounces of water,  take 1 dose every 30 minutes for 2-3 hours or until you  get good results and then once or twice daily to prevent constipation.  ° °

## 2018-11-16 NOTE — ED Notes (Signed)
Pt stated she still in pain. And requested more pain medicine. Shaniko

## 2018-12-01 DIAGNOSIS — Z8616 Personal history of COVID-19: Secondary | ICD-10-CM

## 2018-12-01 HISTORY — DX: Personal history of COVID-19: Z86.16

## 2018-12-27 ENCOUNTER — Emergency Department (HOSPITAL_COMMUNITY): Payer: Medicare Other

## 2018-12-27 ENCOUNTER — Encounter (HOSPITAL_COMMUNITY): Payer: Self-pay | Admitting: Emergency Medicine

## 2018-12-27 ENCOUNTER — Emergency Department (HOSPITAL_COMMUNITY)
Admission: EM | Admit: 2018-12-27 | Discharge: 2018-12-27 | Disposition: A | Discharge status: Home / Self Care | Payer: Medicare Other | Attending: Emergency Medicine | Admitting: Emergency Medicine

## 2018-12-27 ENCOUNTER — Other Ambulatory Visit: Payer: Self-pay

## 2018-12-27 DIAGNOSIS — I10 Essential (primary) hypertension: Secondary | ICD-10-CM | POA: Insufficient documentation

## 2018-12-27 DIAGNOSIS — U071 COVID-19: Secondary | ICD-10-CM | POA: Insufficient documentation

## 2018-12-27 DIAGNOSIS — R0602 Shortness of breath: Secondary | ICD-10-CM | POA: Insufficient documentation

## 2018-12-27 DIAGNOSIS — R05 Cough: Secondary | ICD-10-CM | POA: Diagnosis not present

## 2018-12-27 DIAGNOSIS — J189 Pneumonia, unspecified organism: Secondary | ICD-10-CM

## 2018-12-27 DIAGNOSIS — R911 Solitary pulmonary nodule: Secondary | ICD-10-CM | POA: Diagnosis not present

## 2018-12-27 DIAGNOSIS — E119 Type 2 diabetes mellitus without complications: Secondary | ICD-10-CM | POA: Diagnosis not present

## 2018-12-27 DIAGNOSIS — R0789 Other chest pain: Secondary | ICD-10-CM | POA: Diagnosis not present

## 2018-12-27 DIAGNOSIS — Z79899 Other long term (current) drug therapy: Secondary | ICD-10-CM | POA: Diagnosis not present

## 2018-12-27 DIAGNOSIS — R079 Chest pain, unspecified: Secondary | ICD-10-CM | POA: Diagnosis not present

## 2018-12-27 LAB — CBC
HCT: 40.1 % (ref 36.0–46.0)
Hemoglobin: 13.3 g/dL (ref 12.0–15.0)
MCH: 30.8 pg (ref 26.0–34.0)
MCHC: 33.2 g/dL (ref 30.0–36.0)
MCV: 92.8 fL (ref 80.0–100.0)
Platelets: 222 10*3/uL (ref 150–400)
RBC: 4.32 MIL/uL (ref 3.87–5.11)
RDW: 13.7 % (ref 11.5–15.5)
WBC: 5.7 10*3/uL (ref 4.0–10.5)
nRBC: 0 % (ref 0.0–0.2)

## 2018-12-27 LAB — URINALYSIS, ROUTINE W REFLEX MICROSCOPIC
Bilirubin Urine: NEGATIVE
Glucose, UA: NEGATIVE mg/dL
Ketones, ur: NEGATIVE mg/dL
Nitrite: NEGATIVE
Protein, ur: NEGATIVE mg/dL
Specific Gravity, Urine: 1.032 — ABNORMAL HIGH (ref 1.005–1.030)
pH: 6 (ref 5.0–8.0)

## 2018-12-27 LAB — BASIC METABOLIC PANEL
Anion gap: 12 (ref 5–15)
BUN: 8 mg/dL (ref 8–23)
CO2: 25 mmol/L (ref 22–32)
Calcium: 9.2 mg/dL (ref 8.9–10.3)
Chloride: 100 mmol/L (ref 98–111)
Creatinine, Ser: 0.78 mg/dL (ref 0.44–1.00)
GFR calc Af Amer: >60 mL/min (ref >60)
GFR calc non Af Amer: >60 mL/min (ref >60)
Glucose, Bld: 103 mg/dL — ABNORMAL HIGH (ref 70–99)
Potassium: 3.6 mmol/L (ref 3.5–5.1)
Sodium: 137 mmol/L (ref 135–145)

## 2018-12-27 LAB — LIPASE, BLOOD: Lipase: 21 U/L (ref 11–51)

## 2018-12-27 LAB — PROTIME-INR
INR: 1 (ref 0.8–1.2)
Prothrombin Time: 13.5 seconds (ref 11.4–15.2)

## 2018-12-27 LAB — ETHANOL: Alcohol, Ethyl (B): <10 mg/dL (ref <10)

## 2018-12-27 LAB — RAPID URINE DRUG SCREEN, HOSP PERFORMED
Amphetamines: NOT DETECTED
Barbiturates: NOT DETECTED
Benzodiazepines: NOT DETECTED
Cocaine: NOT DETECTED
Opiates: POSITIVE — AB
Tetrahydrocannabinol: NOT DETECTED

## 2018-12-27 LAB — TROPONIN I (HIGH SENSITIVITY)
Troponin I (High Sensitivity): 5 ng/L (ref <18)
Troponin I (High Sensitivity): 5 ng/L (ref <18)

## 2018-12-27 LAB — HEPATIC FUNCTION PANEL
ALT: 25 U/L (ref 0–44)
AST: 33 U/L (ref 15–41)
Albumin: 3.9 g/dL (ref 3.5–5.0)
Alkaline Phosphatase: 43 U/L (ref 38–126)
Bilirubin, Direct: 0.1 mg/dL (ref 0.0–0.2)
Indirect Bilirubin: 0.6 mg/dL (ref 0.3–0.9)
Total Bilirubin: 0.7 mg/dL (ref 0.3–1.2)
Total Protein: 7.3 g/dL (ref 6.5–8.1)

## 2018-12-27 LAB — SARS CORONAVIRUS 2 BY RT PCR (HOSPITAL ORDER, PERFORMED IN ~~LOC~~ HOSPITAL LAB): SARS Coronavirus 2: POSITIVE — AB

## 2018-12-27 LAB — BRAIN NATRIURETIC PEPTIDE: B Natriuretic Peptide: 19.3 pg/mL (ref 0.0–100.0)

## 2018-12-27 MED ORDER — CYCLOBENZAPRINE HCL 5 MG PO TABS: 5.0000 mg | ORAL_TABLET | Freq: Three times a day (TID) | ORAL | 0 refills | Status: DC | PRN

## 2018-12-27 MED ORDER — MORPHINE SULFATE (PF) 4 MG/ML IV SOLN
4.0000 mg | Freq: Once | INTRAVENOUS | Status: AC
  Administered 2018-12-27: 4 mg via INTRAVENOUS
  Filled 2018-12-27: qty 1

## 2018-12-27 MED ORDER — IOHEXOL 300 MG/ML  SOLN: 100.0000 mL | Freq: Once | INTRAMUSCULAR | Status: DC | PRN

## 2018-12-27 MED ORDER — KETOROLAC TROMETHAMINE 30 MG/ML IJ SOLN
30.0000 mg | Freq: Once | INTRAMUSCULAR | Status: AC
  Administered 2018-12-27: 30 mg via INTRAVENOUS
  Filled 2018-12-27: qty 1

## 2018-12-27 MED ORDER — LEVOFLOXACIN 750 MG PO TABS
750.0000 mg | ORAL_TABLET | Freq: Once | ORAL | Status: AC
  Administered 2018-12-27: 750 mg via ORAL
  Filled 2018-12-27: qty 1

## 2018-12-27 MED ORDER — SODIUM CHLORIDE 0.9% FLUSH
3.0000 mL | Freq: Once | INTRAVENOUS | Status: AC
  Administered 2018-12-27: 3 mL via INTRAVENOUS

## 2018-12-27 MED ORDER — FAMOTIDINE IN NACL 20-0.9 MG/50ML-% IV SOLN
20.0000 mg | Freq: Once | INTRAVENOUS | Status: AC
  Administered 2018-12-27: 20 mg via INTRAVENOUS
  Filled 2018-12-27: qty 50

## 2018-12-27 MED ORDER — IOHEXOL 350 MG/ML SOLN
75.0000 mL | Freq: Once | INTRAVENOUS | Status: AC | PRN
  Administered 2018-12-27: 75 mL via INTRAVENOUS

## 2018-12-27 MED ORDER — LEVOFLOXACIN 750 MG PO TABS: 750.0000 mg | ORAL_TABLET | Freq: Every day | ORAL | 0 refills | Status: DC

## 2018-12-27 MED ORDER — CYCLOBENZAPRINE HCL 10 MG PO TABS
5.0000 mg | ORAL_TABLET | Freq: Once | ORAL | Status: AC
  Administered 2018-12-27: 5 mg via ORAL
  Filled 2018-12-27: qty 1

## 2018-12-27 NOTE — ED Notes (Signed)
Patient transported to CT 

## 2018-12-27 NOTE — ED Provider Notes (Signed)
[facility] EMERGENCY DEPARTMENT Provider Note   CSN: [id number] Arrival date & time: 12/27/18  1250     History   Chief Complaint Chief Complaint  Patient presents with  . Chest Pain  . Shortness of Breath    HPI [name] is a 63 y.o. female.     HPI Patient reports she developed chest pain at about 11 AM.  She reports this is a sharp and aching pain in her right chest.  She indicates much of the anterior chest.  She reports pain is also going into her arms.  It has been constant since onset.  She denies she feels significantly short of breath.  She denies vomiting, lightheadedness or syncope.  No lower extremity swelling or calf pain.  Patient reports she felt well yesterday evening.  She has not had any problems with exertional chest pain or shortness of breath.  No recent fevers, myalgias, chills, cough. Past Medical History:  Diagnosis Date  . Constipation   . Diabetes mellitus 2007   no treatment,no meds at this time per pt  . Hyperlipidemia   . Hyperplastic colon polyp 12-2007   Dr. Ardis Hughs  . Hypertension   . Internal and external hemorrhoids without complication 7/00/1749    Patient Active Problem List   Diagnosis Date Noted  . Left ankle pain 11/11/2018  . Shortness of breath 04/05/2017  . Healthcare maintenance 04/04/2017  . Cervical high risk HPV (human papillomavirus) test positive 09/29/2015  . History of TIA (transient ischemic attack) 03/03/2014  . Morbid obesity with BMI of 45.0-49.9, adult ([place]) 07/31/2012  . Memory impairment 05/04/2012  . Osteoarthritis of left knee 07/26/2008  . Internal and external hemorrhoids without complication 44/96/7591  . Hyperlipidemia associated with type 2 diabetes mellitus ([place]) 10/05/2007  . Diabetes mellitus type 2, controlled, without complications ([place]) 63/84/6659  . Essential hypertension 01/14/2006    Past Surgical History:  Procedure Laterality Date  . CATARACT EXTRACTION, BILATERAL   2019  . COLONOSCOPY  12/23/2007  . TRANSPHENOIDAL / TRANSNASAL HYPOPHYSECTOMY / RESECTION PITUITARY TUMOR  at age 3     OB History   No obstetric history on file.      Home Medications    Prior to Admission medications   Medication Sig Start Date End Date Taking? Authorizing Provider  amLODipine-benazepril (LOTREL) 5-10 MG capsule Take 1 capsule by mouth daily. 12/12/17 05/13/18  Lars Mage, MD  atorvastatin (LIPITOR) 40 MG tablet Take 1 tablet (40 mg total) by mouth daily. 12/12/17 05/13/18  Chundi, Verne Spurr, MD  Blood Glucose Monitoring Suppl (ONETOUCH VERIO) w/Device KIT 1 Units by Does not apply route as needed. 05/17/16   Riccardo Dubin, MD  diclofenac sodium (VOLTAREN) 1 % GEL Apply 2 g topically every 8 (eight) hours as needed. 11/10/18   Chundi, Verne Spurr, MD  glucose blood (ONETOUCH VERIO) test strip Use as instructed 05/17/16   Riccardo Dubin, MD  ibuprofen (ADVIL,MOTRIN) 800 MG tablet Take 1 tablet (800 mg total) every 8 (eight) hours as needed by mouth. 02/14/17   Maryellen Pile, MD  [facility] DELICA LANCETS 93T MISC Please use as directed. 05/17/16   Riccardo Dubin, MD    Family History Family History  Problem Relation Age of Onset  . Stroke Mother        age 73  . Heart attack Father        age 50  . Heart attack Sister        age 18  .  Colon cancer Maternal Aunt        ?pt unsure age maybe 14-70's  . Colon polyps Neg Hx   . Esophageal cancer Neg Hx   . Stomach cancer Neg Hx   . Rectal cancer Neg Hx     Social History Social History   Tobacco Use  . Smoking status: Never Smoker  . Smokeless tobacco: Never Used  Substance Use Topics  . Alcohol use: No    Alcohol/week: 0.0 standard drinks    Frequency: Never    Comment: once a year at Christmas per pt  . Drug use: No     Allergies   Glipizide and Metformin and related   Review of Systems Review of Systems 10 Systems reviewed and are negative for acute change except as noted in the HPI.    Physical Exam Updated Vital Signs BP (!) 141/68 (BP Location: Right Arm)   Pulse 95   Temp 99.8 F (37.7 C) (Oral)   Resp (!) 24   Ht 5' 1"  (1.549 m)   Wt 103.4 kg   BMI 43.08 kg/m   Physical Exam Constitutional:      Appearance: Normal appearance.  HENT:     Nose: Nose normal.     Mouth/Throat:     Mouth: Mucous membranes are moist.     Pharynx: Oropharynx is clear.  Eyes:     Extraocular Movements: Extraocular movements intact.  Cardiovascular:     Rate and Rhythm: Normal rate and regular rhythm.  Pulmonary:     Effort: Pulmonary effort is normal.     Breath sounds: Normal breath sounds.     Comments: Very mild discomfort to palpation over the anterior chest wall. Abdominal:     General: There is no distension.     Palpations: Abdomen is soft.     Tenderness: There is no abdominal tenderness. There is no guarding.  Musculoskeletal: Normal range of motion.        General: No swelling or tenderness.     Right lower leg: No edema.     Left lower leg: No edema.  Skin:    General: Skin is warm and dry.  Neurological:     General: No focal deficit present.     Mental Status: She is alert and oriented to person, place, and time.     Cranial Nerves: No cranial nerve deficit.     Coordination: Coordination normal.  Psychiatric:        Mood and Affect: Mood normal.      ED Treatments / Results  Labs (all labs ordered are listed, but only abnormal results are displayed) Labs Reviewed  CBC  BASIC METABOLIC PANEL  TROPONIN I (HIGH SENSITIVITY)    EKG EKG Interpretation  Date/Time:  Sunday December 27 2018 12:56:47 EDT Ventricular Rate:  94 PR Interval:  162 QRS Duration: 70 QT Interval:  344 QTC Calculation: 430 R Axis:   -16 Text Interpretation:  Normal sinus rhythm Cannot rule out Anterior infarct , age undetermined T wave abnormality, consider inferolateral ischemia Abnormal ECG no sig change from previous Confirmed by Charlesetta Shanks [phone]) on 12/27/2018  1:50:57 PM   Radiology No results found.  Procedures Procedures (including critical care time)  Medications Ordered in ED Medications  sodium chloride flush (NS) 0.9 % injection 3 mL (has no administration in time range)     Initial Impression / Assessment and Plan / ED Course  I have reviewed the triage vital signs and the nursing notes.  Pertinent labs & imaging results that were available during my care of the patient were reviewed by me and considered in my medical decision making (see chart for details).       Patient developed right-sided chest pain several hours prior to presentation.  She is very uncomfortable in appearance.  She also identifies radiation into her arms.  Pain has been constant.  EKG does not show significant change compared to prior EKG.  First troponin is negative.  We will proceed with CT angiogram to rule out PE or other structural anomaly.  Patient is stable with stable blood pressures and heart rate.  Will initiate medications for pain control.  Dr. Darl Householder will assume care for the patient in follow-up on CT angiogram and second set of troponins.  Final disposition pending diagnostic evaluation and patient response to treatment.  Final Clinical Impressions(s) / ED Diagnoses   Final diagnoses:  Chest pain, unspecified type    ED Discharge Orders    None       Charlesetta Shanks, MD 01/01/19 1013

## 2018-12-27 NOTE — ED Notes (Signed)
All appropriate discharge materials reviewed at length with patient. Time for questions provided. Pt has no other questions at this time and verbalizes understanding of all provided materials.  

## 2018-12-27 NOTE — Discharge Instructions (Signed)
Take levaquin daily for a week for pneumonia  Take motrin for pain   Take flexeril for muscle spasms   See your doctor  You have a nodule nodule that needs follow up   Return to ER if you have worse chest pain, trouble breathing, fever

## 2018-12-27 NOTE — ED Notes (Signed)
Patient transported to X-ray 

## 2018-12-27 NOTE — ED Provider Notes (Signed)
  Physical Exam  BP 127/73   Pulse 69   Temp 99.8 F (37.7 C) (Oral)   Resp (!) 25   Ht 5\' 1"  (1.549 m)   Wt 103.4 kg   SpO2 93%   BMI 43.08 kg/m   Physical Exam  ED Course/Procedures     Procedures  MDM  Care assumed at 3 pm. Patient has chest pain, subjective fever yesterday. Sign out pending CTA chest and reassessment.   6:10 PM CTA showed pulm nodule, possible pneumonia. Second trop neg. COVID ordered. WBC nl.  Patient has low-grade temp but is not hypoxic.  Symptoms can be from pneumonia.  Will discharge home with Levaquin.     Drenda Freeze, MD 12/27/18 [phone]

## 2018-12-27 NOTE — ED Triage Notes (Signed)
Pt reports CP across chest, describes as "throbbing", radiating into BUE and neck.  Pt endorses mild SOB, reports cough, denies n/v, dizziness.  Pt endorses subjective fever last night, 99.8 F oral in triage.

## 2018-12-28 ENCOUNTER — Ambulatory Visit: Payer: Medicare Other

## 2019-01-01 ENCOUNTER — Telehealth: Payer: Self-pay | Admitting: *Deleted

## 2019-01-01 NOTE — Telephone Encounter (Signed)
Pt has tested POSITIVE for COVID also has pneumonia, wants to know what she can take for the cough. Please call her at [phone] or advise triage what to tell her.

## 2019-01-01 NOTE — Telephone Encounter (Signed)
Note in chart.    Thank you

## 2019-01-01 NOTE — Telephone Encounter (Signed)
Called the patient to discuss her symptoms. She was seen in the ED on 9/27 for chest pain and fevers. CTA with nodular opacities in the LUL. She was given Levaquin and discharged home. COVID testing later came back positive.   Today she is feeling better but continues to have rhinorrhea and a productive cough. She is no longer febrile. Feels that she is having mucus drip down the back of her throat causing her to cough. She has not tried any OTC medications and is wondering what she can take for her cough. We reviewed her current medications/problem list.   Discussed that she can use OTC cough syrup or lozenges. Recommended avoiding medications that contain "DM." Also discussed the use of antihistamines, nasal sprays, and Netty pot for nasal congestion. Advised that is she begins to experience worsening SHOB or chest pain she should present to the ED for further evaluation. She voices understanding.   Alyssa Homes, MD IMTS PGY3

## 2019-02-01 ENCOUNTER — Other Ambulatory Visit: Payer: Self-pay | Admitting: Internal Medicine

## 2019-02-01 DIAGNOSIS — I10 Essential (primary) hypertension: Secondary | ICD-10-CM

## 2019-02-08 ENCOUNTER — Other Ambulatory Visit: Payer: Self-pay | Admitting: Internal Medicine

## 2019-02-08 ENCOUNTER — Other Ambulatory Visit: Payer: Self-pay

## 2019-02-08 DIAGNOSIS — E1169 Type 2 diabetes mellitus with other specified complication: Secondary | ICD-10-CM

## 2019-02-08 NOTE — Patient Outreach (Signed)
[facility]) Care Management  02/08/2019  Talea ISIDORA LUTTRELL Dec 09, 1955 [id number]   Medication Adherence call to [facility] [facility] [employer] [facility] spoke with patient she is past due on Atorvastatin 40 mg and Amlodipine/Benazepril 5/10 mg,patient explain she takes both medication and has enough at this time,patient ask to call [facility] to check with then ,pharmacy said they have to call doctors office for more refills,Mrs. Daiker is showing past due under [place].   [facility] [place] Management Direct Dial [phone]  Fax [phone] [email or web address]

## 2019-03-02 ENCOUNTER — Other Ambulatory Visit: Payer: Self-pay

## 2019-03-02 NOTE — Patient Outreach (Signed)
[facility]) Care Management  03/02/2019  [name] [birth year] [id number]   Medication Adherence call to Mrs. [name] [facility] [employer] [facility] spoke with patient she is past due on Atorvastatin 40 mg and Amlodipine/Benazepril 5/10 mg,patient explain she has medication at this time,last month we place an order thru [facility] and patient had not pick up,patient said she did not have the money and did not have transportation,explain to patient she can now received her medication from [facility] by mail for free,patient only have to pay by phone with a credit card,patient ask to order the medication from [facility] and will call on thursday to have then mail out to her. [name] is showing past due under [place].   [place] [facility] Management Direct Dial [phone]  Fax [phone] [name]

## 2019-04-28 DIAGNOSIS — H31012 Macula scars of posterior pole (postinflammatory) (post-traumatic), left eye: Secondary | ICD-10-CM | POA: Diagnosis not present

## 2019-04-28 DIAGNOSIS — E119 Type 2 diabetes mellitus without complications: Secondary | ICD-10-CM | POA: Diagnosis not present

## 2019-04-28 DIAGNOSIS — H35033 Hypertensive retinopathy, bilateral: Secondary | ICD-10-CM | POA: Diagnosis not present

## 2019-04-28 DIAGNOSIS — H35013 Changes in retinal vascular appearance, bilateral: Secondary | ICD-10-CM | POA: Diagnosis not present

## 2019-04-28 LAB — HM DIABETES EYE EXAM

## 2019-04-29 ENCOUNTER — Encounter: Payer: Self-pay | Admitting: *Deleted

## 2019-06-17 ENCOUNTER — Ambulatory Visit: Payer: Medicare Other | Attending: Family

## 2019-06-17 DIAGNOSIS — Z23 Encounter for immunization: Secondary | ICD-10-CM

## 2019-06-22 NOTE — Progress Notes (Signed)
   Covid-19 Vaccination Clinic  Name:  [name]    MRN: [id number] DOB: [birth year]  06/22/2019  Ms. Prayer was observed post Covid-19 immunization for 15 minutes without incident. She was provided with Vaccine Information Sheet and instruction to access the V-Safe system.   [name] was instructed to call 911 with any severe reactions post vaccine: Marland Kitchen Difficulty breathing  . Swelling of face and throat  . A fast heartbeat  . A bad rash all over body  . Dizziness and weakness   Immunizations Administered    Name Date Dose VIS Date Route   Moderna COVID-19 Vaccine 06/17/2019  5:15 PM 0.5 mL 03/02/2019 Intramuscular   Manufacturer: Moderna   Lot: [id number]   [place] Number [id number]

## 2019-07-20 ENCOUNTER — Ambulatory Visit: Payer: Medicare Other | Attending: Family

## 2019-07-20 ENCOUNTER — Other Ambulatory Visit: Payer: Self-pay

## 2019-07-20 DIAGNOSIS — Z23 Encounter for immunization: Secondary | ICD-10-CM

## 2019-08-04 ENCOUNTER — Encounter: Payer: Self-pay | Admitting: *Deleted

## 2019-08-04 NOTE — Progress Notes (Signed)

## 2019-08-29 ENCOUNTER — Other Ambulatory Visit: Payer: Self-pay | Admitting: Internal Medicine

## 2019-08-29 DIAGNOSIS — E1169 Type 2 diabetes mellitus with other specified complication: Secondary | ICD-10-CM

## 2019-08-29 DIAGNOSIS — I10 Essential (primary) hypertension: Secondary | ICD-10-CM

## 2019-09-17 ENCOUNTER — Encounter: Payer: Self-pay | Admitting: *Deleted

## 2019-09-17 NOTE — Progress Notes (Signed)
Things That May Be Affecting Your Health:  Alcohol  Hearing loss  Pain    Depression  Home Safety  Sexual Health  x Diabetes x Lack of physical activity  Stress   Difficulty with daily activities  Loneliness  Tiredness   Drug use  Medicines  Tobacco use   Falls  Motor Vehicle Safety  Weight  x Food choices  Oral Health  Other    YOUR PERSONALIZED HEALTH PLAN : 1. Schedule your next subsequent Medicare Wellness visit in one year 2. Attend all of your regular appointments to address your medical issues 3. Complete the preventative screenings and services   Annual Wellness Visit   Medicare Covered Preventative Screenings and [place] Men and Women Who How Often Need? Date of Last Service Action  Abdominal Aortic Aneurysm Adults with AAA risk factors Once     Alcohol Misuse and Counseling All Adults Screening once a year if no alcohol misuse. Counseling up to 4 face to face sessions.     Bone Density Measurement  Adults at risk for osteoporosis Once every 2 yrs     Lipid Panel Z13.6 All adults without CV disease Once every 5 yrs     Colorectal Cancer   Stool sample or  Colonoscopy All adults 48 and older   Once every year  Every 10 years     Depression All Adults Once a year  Today   Diabetes Screening Blood glucose, post glucose load, or GTT Z13.1  All adults at risk  Pre-diabetics  Once per year  Twice per year     Diabetes  Self-Management Training All adults Diabetics 10 hrs first year; 2 hours subsequent years. Requires Copay     Glaucoma  Diabetics  Family history of glaucoma  African Americans 60 yrs +  Hispanic Americans 27 yrs + Annually - requires coppay x    Hepatitis C Z72.89 or F19.20  High Risk for HCV  Born between 1945 and 1965  Annually  Once     HIV Z11.4 All adults based on risk  Annually btw ages 87 & 59 regardless of risk  Annually > 65 yrs if at increased risk     Lung Cancer Screening Asymptomatic adults aged  46-77 with 30 pack yr history and current smoker OR quit within the last 15 yrs Annually Must have counseling and shared decision making documentation before first screen     Medical Nutrition Therapy Adults with   Diabetes  Renal disease  Kidney transplant within past 3 yrs 3 hours first year; 2 hours subsequent years x    Obesity and Counseling All adults Screening once a year Counseling if BMI 30 or higher  Today   Tobacco Use Counseling Adults who use tobacco  Up to 8 visits in one year     Vaccines Z23  Hepatitis B  Influenza   Pneumonia  Adults   Once  Once every flu season  Two different vaccines separated by one year     Next Annual Wellness Visit People with Medicare Every year  Today     Services & Screenings Women Who How Often Need  Date of Last Service Action  Mammogram  Z12.31 Women over 42 One baseline ages 91-39. Annually ager 40 yrs+     Pap tests All women Annually if high risk. Every 2 yrs for normal risk women     Screening for cervical cancer with   Pap (Z01.419 nl or Z01.411abnl) &  HPV Z11.51 Women aged 3 to 10 Once every 5 yrs x    Screening pelvic and breast exams All women Annually if high risk. Every 2 yrs for normal risk women     Sexually Transmitted Diseases  Chlamydia  Gonorrhea  Syphilis All at risk adults Annually for non pregnant females at increased risk         [place] Men Who How Ofter Need  Date of Last Service Action  Prostate Cancer - DRE & PSA Men over 50 Annually.  DRE might require a copay.     Sexually Transmitted Diseases  Syphilis All at risk adults Annually for men at increased risk

## 2019-10-20 ENCOUNTER — Other Ambulatory Visit: Payer: Self-pay

## 2019-10-20 ENCOUNTER — Encounter: Payer: Self-pay | Admitting: Internal Medicine

## 2019-10-20 ENCOUNTER — Ambulatory Visit (INDEPENDENT_AMBULATORY_CARE_PROVIDER_SITE_OTHER): Payer: Medicare Other | Admitting: Internal Medicine

## 2019-10-20 VITALS — Ht 61.0 in | Wt 234.4 lb

## 2019-10-20 DIAGNOSIS — E119 Type 2 diabetes mellitus without complications: Secondary | ICD-10-CM | POA: Diagnosis not present

## 2019-10-20 DIAGNOSIS — Z0000 Encounter for general adult medical examination without abnormal findings: Secondary | ICD-10-CM

## 2019-10-20 DIAGNOSIS — Z6841 Body Mass Index (BMI) 40.0 and over, adult: Secondary | ICD-10-CM

## 2019-10-20 NOTE — Patient Instructions (Addendum)
Annual Wellness Visit   Medicare Covered Preventative Screenings and Services  Services & Screenings Men and Women Who How Often Need? Date of Last Service Action  Abdominal Aortic Aneurysm Adults with AAA risk factors Once     Alcohol Misuse and Counseling All Adults Screening once a year if no alcohol misuse. Counseling up to 4 face to face sessions.     Bone Density Measurement  Adults at risk for osteoporosis Once every 2 yrs     Lipid Panel Z13.6 All adults without CV disease Once every 5 yrs     Colorectal Cancer   Stool sample or  Colonoscopy All adults 52 and older   Once every year  Every 10 years     Depression All Adults Once a year  Today   Diabetes Screening Blood glucose, post glucose load, or GTT Z13.1  All adults at risk  Pre-diabetics  Once per year  Twice per year     Diabetes  Self-Management Training All adults Diabetics 10 hrs first year; 2 hours subsequent years. Requires Copay     Glaucoma  Diabetics  Family history of glaucoma  African Americans 40 yrs +  Hispanic Americans 60 yrs + Annually - requires coppay   You saw Dr. Martinique DeMarco on 04/28/2019 for Optometry  Hepatitis C Z72.89 or F19.20  High Risk for HCV  Born between 1945 and 1965  Annually  Once     HIV Z11.4 All adults based on risk  Annually btw ages 35 & 55 regardless of risk  Annually > 65 yrs if at increased risk     Lung Cancer Screening Asymptomatic adults aged 2-77 with 30 pack yr history and current smoker OR quit within the last 15 yrs Annually Must have counseling and shared decision making documentation before first screen     Medical Nutrition Therapy Adults with   Diabetes  Renal disease  Kidney transplant within past 3 yrs 3 hours first year; 2 hours subsequent years   Referral to [employer]  ([place])  Obesity and Counseling All adults Screening once a year Counseling if BMI 30 or higher  Today   Tobacco Use Counseling Adults who use tobacco  Up to 8  visits in one year     Vaccines Z23  Hepatitis B  Influenza   Pneumonia  Adults   Once  Once every flu season  Two different vaccines separated by one year     Next Annual Wellness Visit People with Medicare Every year  Today     [place] Women Who How Often Need  Date of Last Service Action  Mammogram  Z12.31 Women over 68 One baseline ages 39-39. Annually ager 40 yrs+   Please call Dr. Rosana Hoes at [phone] to schedule appt for mammogram and pap smear.  Pap tests All women Annually if high risk. Every 2 yrs for normal risk women   Please call Dr. Rosana Hoes at [phone] to schedule appt for mammogram and pap smear.  Screening for cervical cancer with   Pap (Z01.419 nl or Z01.411abnl) &  HPV Z11.51 Women aged 19 to 13 Once every 5 yrs     Screening pelvic and breast exams All women Annually if high risk. Every 2 yrs for normal risk women     Sexually Transmitted Diseases  Chlamydia  Gonorrhea  Syphilis All at risk adults Annually for non pregnant females at increased risk         [place] Men Who How Ofter  Need  Date of Last Service Action  Prostate Cancer - DRE & PSA Men over 50 Annually.  DRE might require a copay.     Sexually Transmitted Diseases  Syphilis All at risk adults Annually for men at increased risk         Things That May Be Affecting Your Health:  Alcohol  Hearing loss  Pain    Depression  Home Safety  Sexual Health  X Diabetes X Lack of physical activity  Stress   Difficulty with daily activities  Loneliness  Tiredness   Drug use  Medicines  Tobacco use   Falls  Motor Vehicle Safety X Weight  X Food choices  Oral Health  Other    YOUR PERSONALIZED HEALTH PLAN : 1. Schedule your next subsequent Medicare Wellness visit in one year 2. Attend all of your regular appointments to address your medical issues 3. Complete the preventative screenings and services 4. Congratulations on your 5% weight loss goal! A  referral has been placed to [facility] to assist you in reaching this goal. 5. Begin seated and standing exercises with exercise band to increase strength and balance. 6. Please call Dr. Rosana Hoes at [phone] to schedule appt for mammogram and pap smear. 7. You have been scheduled to meet your new PCP (Dr. Dorian Pod) on 10/28/2019 at 9:45 AM    Diabetes Mellitus and [place] care is an important part of your health, especially when you have diabetes. Diabetes may cause you to have problems because of poor blood flow (circulation) to your feet and legs, which can cause your skin to:  Become thinner and drier.  Break more easily.  Heal more slowly.  Peel and crack. You may also have nerve damage (neuropathy) in your legs and feet, causing decreased feeling in them. This means that you may not notice minor injuries to your feet that could lead to more serious problems. Noticing and addressing any potential problems early is the best way to prevent future foot problems. How to care for your feet Foot hygiene  Wash your feet daily with warm water and mild soap. Do not use hot water. Then, pat your feet and the areas between your toes until they are completely dry. Do not soak your feet as this can dry your skin.  Trim your toenails straight across. Do not dig under them or around the cuticle. File the edges of your nails with an emery board or nail file.  Apply a moisturizing lotion or petroleum jelly to the skin on your feet and to dry, brittle toenails. Use lotion that does not contain alcohol and is unscented. Do not apply lotion between your toes. Shoes and socks  Wear clean socks or stockings every day. Make sure they are not too tight. Do not wear knee-high stockings since they may decrease blood flow to your legs.  Wear shoes that fit properly and have enough cushioning. Always look in your shoes before you put them on to be sure there are no objects inside.  To break in  new shoes, wear them for just a few hours a day. This prevents injuries on your feet. Wounds, scrapes, corns, and calluses  Check your feet daily for blisters, cuts, bruises, sores, and redness. If you cannot see the bottom of your feet, use a mirror or ask someone for help.  Do not cut corns or calluses or try to remove them with medicine.  If you find a minor scrape, cut, or break  in the skin on your feet, keep it and the skin around it clean and dry. You may clean these areas with mild soap and water. Do not clean the area with peroxide, alcohol, or iodine.  If you have a wound, scrape, corn, or callus on your foot, look at it several times a day to make sure it is healing and not infected. Check for: ? Redness, swelling, or pain. ? Fluid or blood. ? Warmth. ? Pus or a bad smell. General instructions  Do not cross your legs. This may decrease blood flow to your feet.  Do not use heating pads or hot water bottles on your feet. They may burn your skin. If you have lost feeling in your feet or legs, you may not know this is happening until it is too late.  Protect your feet from hot and cold by wearing shoes, such as at the beach or on hot pavement.  Schedule a complete foot exam at least once a year (annually) or more often if you have foot problems. If you have foot problems, report any cuts, sores, or bruises to your health care provider immediately. Contact a health care provider if:  You have a medical condition that increases your risk of infection and you have any cuts, sores, or bruises on your feet.  You have an injury that is not healing.  You have redness on your legs or feet.  You feel burning or tingling in your legs or feet.  You have pain or cramps in your legs and feet.  Your legs or feet are numb.  Your feet always feel cold.  You have pain around a toenail. Get help right away if:  You have a wound, scrape, corn, or callus on your foot and: ? You have  pain, swelling, or redness that gets worse. ? You have fluid or blood coming from the wound, scrape, corn, or callus. ? Your wound, scrape, corn, or callus feels warm to the touch. ? You have pus or a bad smell coming from the wound, scrape, corn, or callus. ? You have a fever. ? You have a red line going up your leg. Summary  Check your feet every day for cuts, sores, red spots, swelling, and blisters.  Moisturize feet and legs daily.  Wear shoes that fit properly and have enough cushioning.  If you have foot problems, report any cuts, sores, or bruises to your health care provider immediately.  Schedule a complete foot exam at least once a year (annually) or more often if you have foot problems. This information is not intended to replace advice given to you by your health care provider. Make sure you discuss any questions you have with your health care provider. Document Revised: 12/09/2018 Document Reviewed: 04/19/2016 Elsevier Patient Education  [place] Prevention in the Home, Adult Falls can cause injuries. They can happen to people of all ages. There are many things you can do to make your home safe and to help prevent falls. Ask for help when making these changes, if needed. What actions can I take to prevent falls? General Instructions  Use good lighting in all rooms. Replace any light bulbs that burn out.  Turn on the lights when you go into a dark area. Use night-lights.  Keep items that you use often in easy-to-reach places. Lower the shelves around your home if necessary.  Set up your furniture so you have a clear path. Avoid moving your furniture  around.  Do not have throw rugs and other things on the floor that can make you trip.  Avoid walking on wet floors.  If any of your floors are uneven, fix them.  Add color or contrast paint or tape to clearly mark and help you see: ? Any grab bars or handrails. ? First and last steps of  stairways. ? Where the edge of each step is.  If you use a stepladder: ? Make sure that it is fully opened. Do not climb a closed stepladder. ? Make sure that both sides of the stepladder are locked into place. ? Ask someone to hold the stepladder for you while you use it.  If there are any pets around you, be aware of where they are. What can I do in the bathroom?      Keep the floor dry. Clean up any water that spills onto the floor as soon as it happens.  Remove soap buildup in the tub or shower regularly.  Use non-skid mats or decals on the floor of the tub or shower.  Attach bath mats securely with double-sided, non-slip rug tape.  If you need to sit down in the shower, use a plastic, non-slip stool.  Install grab bars by the toilet and in the tub and shower. Do not use towel bars as grab bars. What can I do in the bedroom?  Make sure that you have a light by your bed that is easy to reach.  Do not use any sheets or blankets that are too big for your bed. They should not hang down onto the floor.  Have a firm chair that has side arms. You can use this for support while you get dressed. What can I do in the kitchen?  Clean up any spills right away.  If you need to reach something above you, use a strong step stool that has a grab bar.  Keep electrical cords out of the way.  Do not use floor polish or wax that makes floors slippery. If you must use wax, use non-skid floor wax. What can I do with my stairs?  Do not leave any items on the stairs.  Make sure that you have a light switch at the top of the stairs and the bottom of the stairs. If you do not have them, ask someone to add them for you.  Make sure that there are handrails on both sides of the stairs, and use them. Fix handrails that are broken or loose. Make sure that handrails are as long as the stairways.  Install non-slip stair treads on all stairs in your home.  Avoid having throw rugs at the top or  bottom of the stairs. If you do have throw rugs, attach them to the floor with carpet tape.  Choose a carpet that does not hide the edge of the steps on the stairway.  Check any carpeting to make sure that it is firmly attached to the stairs. Fix any carpet that is loose or worn. What can I do on the outside of my home?  Use bright outdoor lighting.  Regularly fix the edges of walkways and driveways and fix any cracks.  Remove anything that might make you trip as you walk through a door, such as a raised step or threshold.  Trim any bushes or trees on the path to your home.  Regularly check to see if handrails are loose or broken. Make sure that both sides of any steps have  handrails.  Install guardrails along the edges of any raised decks and porches.  Clear walking paths of anything that might make someone trip, such as tools or rocks.  Have any leaves, snow, or ice cleared regularly.  Use sand or salt on walking paths during winter.  Clean up any spills in your garage right away. This includes grease or oil spills. What other actions can I take?  Wear shoes that: ? Have a low heel. Do not wear high heels. ? Have rubber bottoms. ? Are comfortable and fit you well. ? Are closed at the toe. Do not wear open-toe sandals.  Use tools that help you move around (mobility aids) if they are needed. These include: ? Canes. ? Walkers. ? Scooters. ? Crutches.  Review your medicines with your doctor. Some medicines can make you feel dizzy. This can increase your chance of falling. Ask your doctor what other things you can do to help prevent falls. Where to find more information  Centers for Disease Control and Prevention, [employer]AD[employer]: https://garcia.biz/  [employer] on Aging: BrainJudge.co.uk Contact a doctor if:  You are afraid of falling at home.  You feel weak, drowsy, or dizzy at home.  You fall at home. Summary  There are many simple things that you can do  to make your home safe and to help prevent falls.  Ways to make your home safe include removing tripping hazards and installing grab bars in the bathroom.  Ask for help when making these changes in your home. This information is not intended to replace advice given to you by your health care provider. Make sure you discuss any questions you have with your health care provider. Document Revised: 07/09/2018 Document Reviewed: 10/31/2016 Elsevier Patient Education  2020 [place] Maintenance, Female Adopting a healthy lifestyle and getting preventive care are important in promoting health and wellness. Ask your health care provider about:  The right schedule for you to have regular tests and exams.  Things you can do on your own to prevent diseases and keep yourself healthy. What should I know about diet, weight, and exercise? Eat a healthy diet   Eat a diet that includes plenty of vegetables, fruits, low-fat dairy products, and lean protein.  Do not eat a lot of foods that are high in solid fats, added sugars, or sodium. Maintain a healthy weight Body mass index (BMI) is used to identify weight problems. It estimates body fat based on height and weight. Your health care provider can help determine your BMI and help you achieve or maintain a healthy weight. Get regular exercise Get regular exercise. This is one of the most important things you can do for your health. Most adults should:  Exercise for at least 150 minutes each week. The exercise should increase your heart rate and make you sweat (moderate-intensity exercise).  Do strengthening exercises at least twice a week. This is in addition to the moderate-intensity exercise.  Spend less time sitting. Even light physical activity can be beneficial. Watch cholesterol and blood lipids Have your blood tested for lipids and cholesterol at 65 years of age, then have this test every 5 years. Have your cholesterol levels  checked more often if:  Your lipid or cholesterol levels are high.  You are older than 65 years of age.  You are at high risk for heart disease. What should I know about cancer screening? Depending on your health history and family history, you may need to have cancer  screening at various ages. This may include screening for:  Breast cancer.  Cervical cancer.  Colorectal cancer.  Skin cancer.  Lung cancer. What should I know about heart disease, diabetes, and high blood pressure? Blood pressure and heart disease  High blood pressure causes heart disease and increases the risk of stroke. This is more likely to develop in people who have high blood pressure readings, are of African descent, or are overweight.  Have your blood pressure checked: ? Every 3-5 years if you are 10-68 years of age. ? Every year if you are 34 years old or older. Diabetes Have regular diabetes screenings. This checks your fasting blood sugar level. Have the screening done:  Once every three years after age 39 if you are at a normal weight and have a low risk for diabetes.  More often and at a younger age if you are overweight or have a high risk for diabetes. What should I know about preventing infection? Hepatitis B If you have a higher risk for hepatitis B, you should be screened for this virus. Talk with your health care provider to find out if you are at risk for hepatitis B infection. Hepatitis C Testing is recommended for:  Everyone born from [birth year] through 1966.  Anyone with known risk factors for hepatitis C. Sexually transmitted infections (STIs)  Get screened for STIs, including gonorrhea and chlamydia, if: ? You are sexually active and are younger than 65 years of age. ? You are older than 65 years of age and your health care provider tells you that you are at risk for this type of infection. ? Your sexual activity has changed since you were last screened, and you are at increased risk  for chlamydia or gonorrhea. Ask your health care provider if you are at risk.  Ask your health care provider about whether you are at high risk for HIV. Your health care provider may recommend a prescription medicine to help prevent HIV infection. If you choose to take medicine to prevent HIV, you should first get tested for HIV. You should then be tested every 3 months for as long as you are taking the medicine. Pregnancy  If you are about to stop having your period (premenopausal) and you may become pregnant, seek counseling before you get pregnant.  Take 400 to 800 micrograms (mcg) of folic acid every day if you become pregnant.  Ask for birth control (contraception) if you want to prevent pregnancy. Osteoporosis and menopause Osteoporosis is a disease in which the bones lose minerals and strength with aging. This can result in bone fractures. If you are 40 years old or older, or if you are at risk for osteoporosis and fractures, ask your health care provider if you should:  Be screened for bone loss.  Take a calcium or vitamin D supplement to lower your risk of fractures.  Be given hormone replacement therapy (HRT) to treat symptoms of menopause. Follow these instructions at home: Lifestyle  Do not use any products that contain nicotine or tobacco, such as cigarettes, e-cigarettes, and chewing tobacco. If you need help quitting, ask your health care provider.  Do not use street drugs.  Do not share needles.  Ask your health care provider for help if you need support or information about quitting drugs. Alcohol use  Do not drink alcohol if: ? Your health care provider tells you not to drink. ? You are pregnant, may be pregnant, or are planning to become pregnant.  If you  drink alcohol: ? Limit how much you use to 0-1 drink a day. ? Limit intake if you are breastfeeding.  Be aware of how much alcohol is in your drink. In the [place] one drink equals one 12 oz bottle of beer (355  mL), one 5 oz glass of wine (148 mL), or one 1 oz glass of hard liquor (44 mL). General instructions  Schedule regular health, dental, and eye exams.  Stay current with your vaccines.  Tell your health care provider if: ? You often feel depressed. ? You have ever been abused or do not feel safe at home. Summary  Adopting a healthy lifestyle and getting preventive care are important in promoting health and wellness.  Follow your health care provider's instructions about healthy diet, exercising, and getting tested or screened for diseases.  Follow your health care provider's instructions on monitoring your cholesterol and blood pressure. This information is not intended to replace advice given to you by your health care provider. Make sure you discuss any questions you have with your health care provider. Document Revised: 03/11/2018 Document Reviewed: 03/11/2018 Elsevier Patient Education  2020 [employer].

## 2019-10-20 NOTE — Progress Notes (Signed)
This AWV is being conducted by [name] only. The patient was located at home and I was located in [facility]. The patient's identity was confirmed using their DOB and current address. The patient or his/her legal guardian has consented to being evaluated through a telephone encounter and understands the associated risks (an examination cannot be done and the patient may need to come in for an appointment) / benefits (allows the patient to remain at home, decreasing exposure to coronavirus). I personally spent 38 minutes conducting the AWV.  Subjective:   [name] is a 64 y.o. female who presents for a Medicare Annual Wellness Visit.  The following items have been reviewed and updated today in the appropriate area in the EMR.   Health Risk Assessment  Height, weight, BMI, and BP Visual acuity if needed Depression screen Fall risk / safety level Advance directive discussion Medical and family history were reviewed and updated Updating list of other providers & suppliers Medication reconciliation, including over the counter medicines Cognitive screen Written screening schedule Risk Factor list Personalized health advice, risky behaviors, and treatment advice  Social History   Social History Narrative   Current Social History 10/20/2019        Patient lives alone in an apartment which is 1 story. There are 4 steps with handrails up to the entrance the patient uses.       Patient's method of transportation is via son, niece, neighbor, or friend      The patient currently disabled.      Identified important Relationships are My husband (lives separately), my son, niece, and step-dad       Pets : None       Interests / Fun: "Go to cousin's or niece's and play Spades or other games."       Current Stressors: "When people get to arguing."       Religious / Personal Beliefs: "I believe in God. Not going to church right now but thinking about going back."       [name],  BSN, RN-BC             Objective:    Vitals: Ht 5\' 1"  (1.549 m)   Wt 234 lb 6.4 oz (106.3 kg) Comment: Per patient's home scale  BMI 44.29 kg/m  Vitals are patient reported  Activities of Daily Living In your present state of health, do you have any difficulty performing the following activities: 10/20/2019 11/10/2018  Hearing? Y N  Comment Sometimes -  Vision? N N  Comment had treatment for glaucoma -  Difficulty concentrating or making decisions? [name]  Walking or climbing stairs? Y Y  Dressing or bathing? N N  Doing errands, shopping? N N  Some recent data might be hidden    Goals Goals    .  Blood Pressure < 130/80    .  HEMOGLOBIN A1C < 7.0    .  LDL CALC < 100    .  Weight (lb) < 223 lb (101.2 kg) (pt-stated)      5% weight loss goal. Will refer back to Christene Slates for help reaching this goal.        Fall Risk Fall Risk  10/20/2019 11/10/2018 12/12/2017 09/05/2017 02/14/2017  Falls in the past year? 1 0 No No No  Number falls in past yr: 0 - - - -  Injury with Fall? 1 - - - -  Risk for fall due to : History of fall(s);Impaired  mobility Impaired balance/gait;Impaired mobility - - -  Risk for fall due to: Comment Tripped over clothing on floor - - - -  Follow up Education provided;Falls prevention discussed Falls prevention discussed - - -   CDC Handout on Fall Prevention and Handout on Home Exercise Program, Access codes [email or web address] and [email or web address] given/mailed to patient with exercise band.    Depression Screen PHQ 2/9 Scores 10/20/2019 11/10/2018 12/12/2017 09/05/2017  PHQ - 2 Score 0 2 0 0  PHQ- 9 Score 4 8 - -     Cognitive Testing Six-Item Cognitive Screener   "I would like to ask you some questions that ask you to use your memory. I am going to name three objects. Please wait until I say all three words, then repeat them. Remember what they are  because I am going to ask you to name them again in a few minutes. Please repeat these words for me: APPLE--TABLE--PENNY."  (Interviewer may repeat names 3 times if necessary but repetition not scored.)  Did patient correctly repeat all three words? Yes - may proceed with screen  What year is this? Correct What month is this? Correct What day of the week is this? Correct  What were the three objects I asked you to remember? . Apple Correct . Table Correct . Penny Correct  Score one point for each incorrect answer.  A score of 2 or more points warrants additional investigation.  Patient's score 0    Assessment and Plan:     Patient made a 5% weight loss goal! A referral has been placed to [facility] to assist her in reaching this goal. Patient will begin seated and standing exercises with exercise band to increase strength and balance. Patient will call Dr. Rosana Hoes (OB/GYN) at [phone] to schedule appt for mammogram and pap smear. Patient has been scheduled to meet new PCP on 10/28/2019 at 9:45 AM  During the course of the visit the patient was educated and counseled about appropriate screening and preventive services as documented in the assessment and plan.  The printed AVS was given to the patient and included an updated screening schedule, a list of risk factors, and personalized health advice.        [name], RN  10/20/2019

## 2019-10-21 ENCOUNTER — Encounter: Payer: Self-pay | Admitting: Internal Medicine

## 2019-10-21 ENCOUNTER — Telehealth: Payer: Self-pay

## 2019-10-21 NOTE — Progress Notes (Signed)
I discussed the AWV findings with the RN who conducted the visit. I was present in the office suite and immediately available to provide assistance and direction throughout the time the service was provided.  Marianna Payment, D.O. [facility], PGY-2 Pager: [phone], Phone: [phone] Date 10/21/2019 Time 8:53 AM

## 2019-10-26 NOTE — Progress Notes (Signed)
Internal Medicine Clinic Attending  Case discussed with Dr.  Coe  at the time of the visit.  We reviewed the AWV findings.  I agree with the assessment, diagnosis, and plan of care documented in the AWV note.     

## 2019-10-27 ENCOUNTER — Encounter: Payer: Self-pay | Admitting: Internal Medicine

## 2019-10-27 NOTE — Progress Notes (Signed)
Established Patient Office Visit  Subjective:  Patient ID: [name], female    DOB: [birth year]  Age: 64 y.o. MRN: [id number]  CC: No chief complaint on file.  Health maintenance: Eye exam - [facility] 04/28/19, no retinopathy, f/u 1 yr Mammo - 12/2017 with [facility], instructed to schedule for now [facility] - due for f/u given high risk HPV history Colonoscopy (hx of polyps; + FHx) - most recent 2020, due 2025 per Dr. Ardis Hughs Immunizations: Has had Covid vaccination, and PPSV 23.  Needs PCV 13 and TDap  HPI [name] presents for f/u of diet-controlled DM2 w/o complications, HTN, and review of health maintenance and prevention; meet new PCP (me).  She has had very few medical encounters in the past year, but most recently completed a very helpful Medicare Wellness Visit. She has no complaints today except for generalized exhaustion. THis is described simply as lack of energy.  No dizziness, muscle weakness, insomnia, or breathing difficulty.  No melena, no noted pallor.   Past Medical History:  Diagnosis Date  . Constipation   . Essential hypertension   . History of pituitary tumor   . Hx of bilateral cataracts; s/p extractions   . Hyperlipidemia   . Internal and external hemorrhoids without complication 04/01/7508  . Obesity, morbid, BMI 40.0-49.9 ([place])   . Personal history of colonic polyps 12/2007   most recent colonoscopy 05/2018 (1 adenomatous polyp removed); repeat 2025 per Dr. Ardis Hughs  . Personal history of covid-19 12/2018  . Type 2 diabetes mellitus without complication, with no history of insulin use ([place]) 2007    Past Surgical History:  Procedure Laterality Date  . CATARACT EXTRACTION, BILATERAL  2019  . COLONOSCOPY  12/23/2007  . TRANSPHENOIDAL / TRANSNASAL HYPOPHYSECTOMY / RESECTION PITUITARY TUMOR  at age 56    Outpatient Medications Prior to Visit  Medication Sig Dispense Refill  . amLODipine-benazepril (LOTREL) 5-10 MG capsule TAKE 1 CAPSULE BY MOUTH EVERY DAY  90 capsule 1  . atorvastatin (LIPITOR) 40 MG tablet TAKE 1 TABLET BY MOUTH EVERY DAY 90 tablet 1  . Blood Glucose Monitoring Suppl (ONETOUCH VERIO) w/Device KIT 1 Units by Does not apply route as needed. 1 kit 0  . cyclobenzaprine (FLEXERIL) 5 MG tablet Take 1 tablet (5 mg total) by mouth 3 (three) times daily as needed. 10 tablet 0  . diclofenac sodium (VOLTAREN) 1 % GEL Apply 2 g topically every 8 (eight) hours as needed. 50 g g  . glucose blood (ONETOUCH VERIO) test strip Use as instructed 100 each 12  . ibuprofen (ADVIL,MOTRIN) 800 MG tablet Take 1 tablet (800 mg total) every 8 (eight) hours as needed by mouth. 30 tablet 0  . levofloxacin (LEVAQUIN) 750 MG tablet Take 1 tablet (750 mg total) by mouth daily. X 7 days 7 tablet 0  . ONETOUCH DELICA LANCETS 25E MISC Please use as directed. 100 each 0   Facility-Administered Medications Prior to Visit  Medication Dose Route Frequency Provider Last Rate Last Admin  . 0.9 %  sodium chloride infusion  500 mL Intravenous Once Milus Banister, MD        Allergies  Allergen Reactions  . Glipizide Nausea Only    Hypoglycemic symptoms from infrequent meals.  . Metformin And Related Diarrhea    ROS Negative for chest pain/pressure, difficult breathing, elimination problems, joint pain, insomnia, burning pain or numbness in feet.   Objective:    Physical Exam  There were no vitals taken for this  visit. Wt Readings from Last 3 Encounters:  10/20/19 234 lb 6.4 oz (106.3 kg)  12/27/18 228 lb (103.4 kg)  11/15/18 234 lb 11.2 oz (106.5 kg)     Health Maintenance Due  Topic Date Due  . PAP SMEAR-Modifier  02/14/2018  . FOOT EXAM  04/04/2018  . MAMMOGRAM  01/02/2019  . HEMOGLOBIN A1C  05/13/2019      Assessment & Plan:   Problem List Items Addressed This Visit      Endocrine   Diabetes mellitus type 2, controlled, without complications ([place]) (Chronic)   Hyperlipidemia associated with type 2 diabetes mellitus ([place]) - Primary  (Chronic)    Order today: BMP, A1c, lipid panel, vit D, CBC, iron studies, B12  HTN and DM2  well controlled, no changes in medications at this time, f/u labs.   The differential for exhaustion is broad, including anemia, hypothyroidism, vitamin deficiency, which will be evaluated with labs.       Angelica Pou, MD

## 2019-10-28 ENCOUNTER — Ambulatory Visit (INDEPENDENT_AMBULATORY_CARE_PROVIDER_SITE_OTHER): Payer: Medicare Other | Admitting: Internal Medicine

## 2019-10-28 ENCOUNTER — Ambulatory Visit: Payer: Medicare Other | Admitting: Dietician

## 2019-10-28 VITALS — BP 121/72 | HR 68 | Temp 97.8°F | Wt 237.7 lb

## 2019-10-28 DIAGNOSIS — E785 Hyperlipidemia, unspecified: Secondary | ICD-10-CM

## 2019-10-28 DIAGNOSIS — R5383 Other fatigue: Secondary | ICD-10-CM

## 2019-10-28 DIAGNOSIS — E119 Type 2 diabetes mellitus without complications: Secondary | ICD-10-CM

## 2019-10-28 DIAGNOSIS — E559 Vitamin D deficiency, unspecified: Secondary | ICD-10-CM | POA: Diagnosis not present

## 2019-10-28 DIAGNOSIS — E1169 Type 2 diabetes mellitus with other specified complication: Secondary | ICD-10-CM

## 2019-10-28 DIAGNOSIS — R6889 Other general symptoms and signs: Secondary | ICD-10-CM | POA: Diagnosis not present

## 2019-10-28 LAB — POCT GLYCOSYLATED HEMOGLOBIN (HGB A1C): Hemoglobin A1C: 6.3 % — AB (ref 4.0–5.6)

## 2019-10-28 LAB — GLUCOSE, CAPILLARY: Glucose-Capillary: 112 mg/dL — ABNORMAL HIGH (ref 70–99)

## 2019-10-29 LAB — BMP8+ANION GAP
Anion Gap: 15 mmol/L (ref 10.0–18.0)
BUN/Creatinine Ratio: 19 (ref 12–28)
BUN: 13 mg/dL (ref 8–27)
CO2: 24 mmol/L (ref 20–29)
Calcium: 9.4 mg/dL (ref 8.7–10.3)
Chloride: 100 mmol/L (ref 96–106)
Creatinine, Ser: 0.69 mg/dL (ref 0.57–1.00)
GFR calc Af Amer: 106 mL/min/{1.73_m2} (ref >59)
GFR calc non Af Amer: 92 mL/min/{1.73_m2} (ref >59)
Glucose: 112 mg/dL — ABNORMAL HIGH (ref 65–99)
Potassium: 4.6 mmol/L (ref 3.5–5.2)
Sodium: 139 mmol/L (ref 134–144)

## 2019-10-29 LAB — LIPID PANEL
Chol/HDL Ratio: 2.1 ratio (ref 0.0–4.4)
Cholesterol, Total: 129 mg/dL (ref 100–199)
HDL: 62 mg/dL (ref >39)
LDL Chol Calc (NIH): 55 mg/dL (ref 0–99)
Triglycerides: 57 mg/dL (ref 0–149)
VLDL Cholesterol Cal: 12 mg/dL (ref 5–40)

## 2019-10-29 LAB — IRON: Iron: 66 ug/dL (ref 27–139)

## 2019-10-29 LAB — CBC
Hematocrit: 35.8 % (ref 34.0–46.6)
Hemoglobin: 12 g/dL (ref 11.1–15.9)
MCH: 31.1 pg (ref 26.6–33.0)
MCHC: 33.5 g/dL (ref 31.5–35.7)
MCV: 93 fL (ref 79–97)
Platelets: 232 10*3/uL (ref 150–450)
RBC: 3.86 x10E6/uL (ref 3.77–5.28)
RDW: 13.1 % (ref 11.7–15.4)
WBC: 7.6 10*3/uL (ref 3.4–10.8)

## 2019-10-29 LAB — VITAMIN D 25 HYDROXY (VIT D DEFICIENCY, FRACTURES): Vit D, 25-Hydroxy: 12.8 ng/mL — ABNORMAL LOW (ref 30.0–100.0)

## 2019-10-29 LAB — VITAMIN B12: Vitamin B-12: 411 pg/mL (ref 232–1245)

## 2019-10-29 LAB — TSH: TSH: 1.96 u[IU]/mL (ref 0.450–4.500)

## 2019-11-22 IMAGING — DX DG CHEST 2V
2 series · 2 of 2 positions shown · non-contrast
Comparison: None.

CLINICAL DATA: Pt complains of chest pain in the center of her
chest with pain radiating down both arms and SOB x 1 day. Hx of
HTN.Cp, mild SOB with cough

EXAM:
CHEST - 2 VIEW

[chest lat]
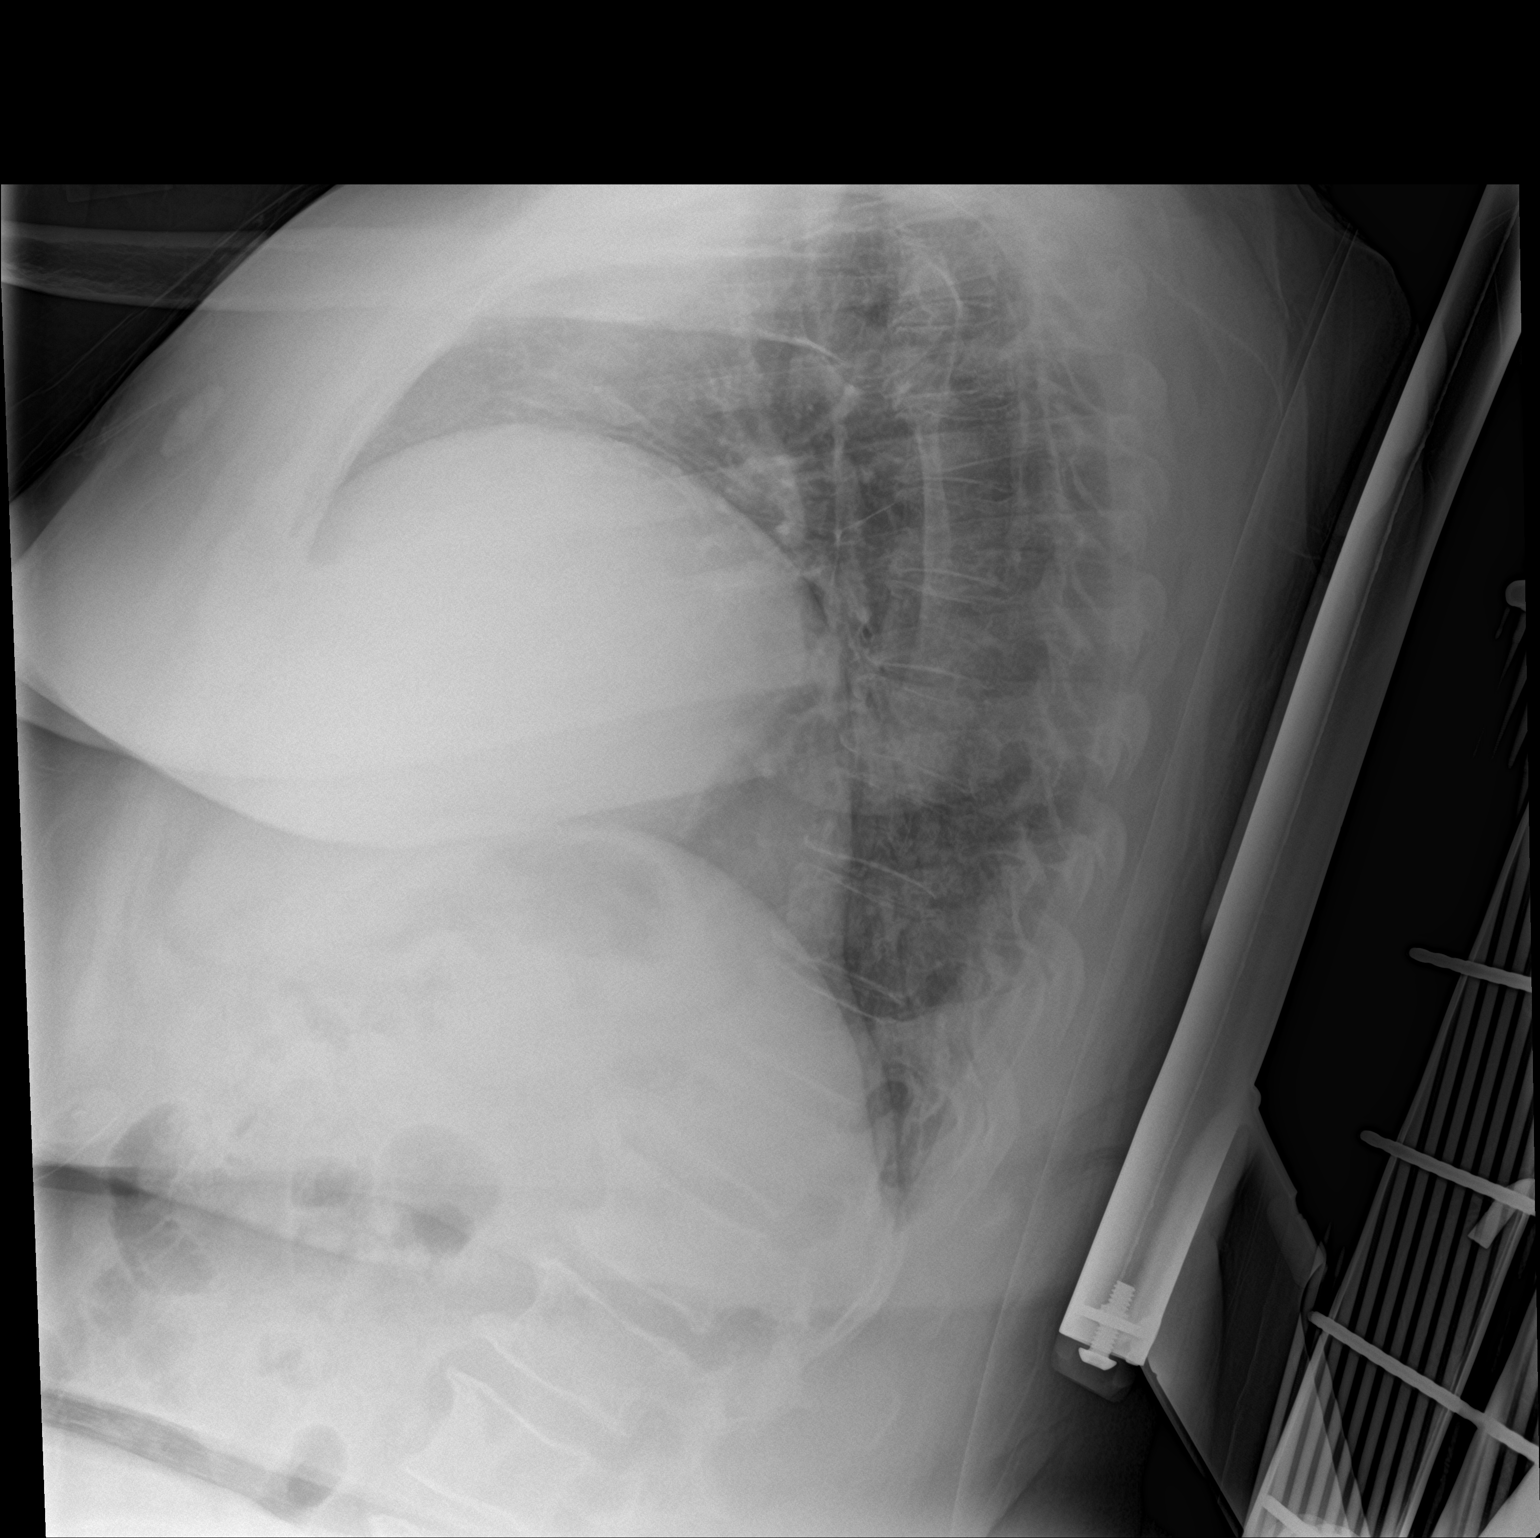

[chest ap]
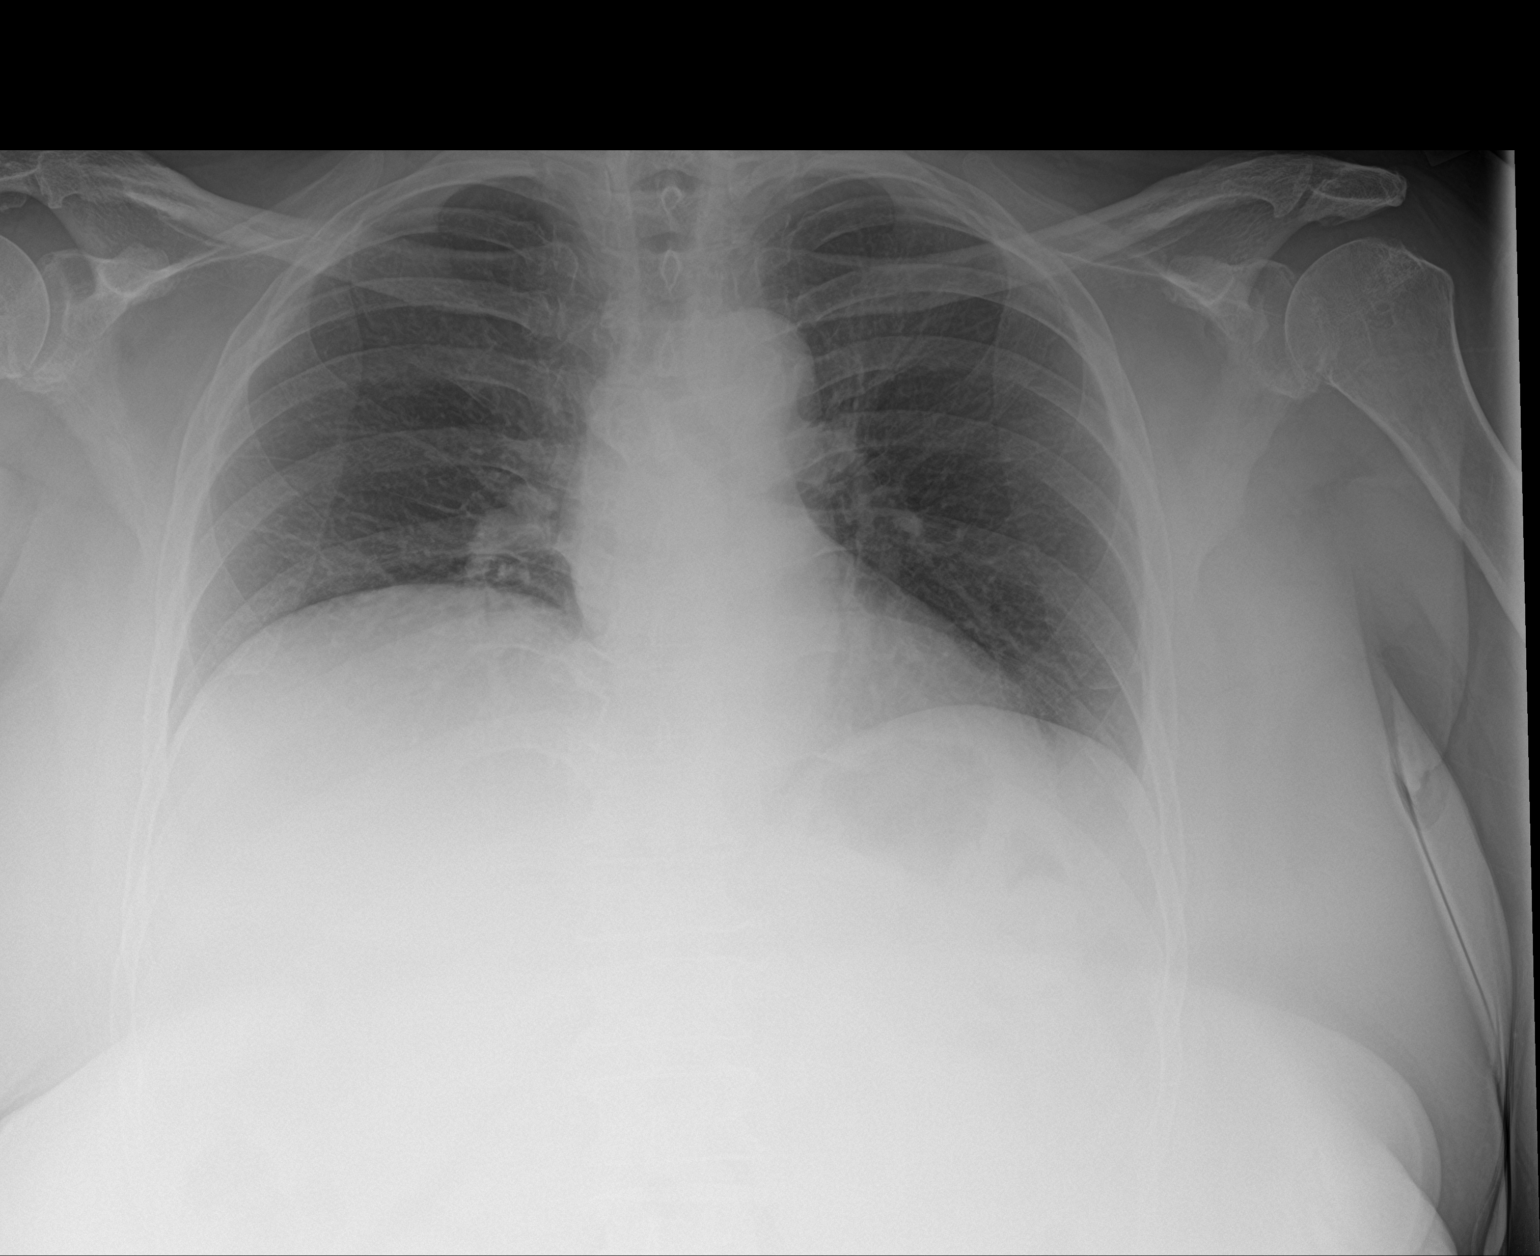

[2 of 2 positions shown; findings below may reference images not displayed]

FINDINGS: Normal mediastinum and cardiac silhouette. Normal pulmonary
vasculature. No evidence of effusion, infiltrate, or pneumothorax.
No acute bony abnormality.
IMPRESSION: No acute cardiopulmonary process.

## 2019-11-26 ENCOUNTER — Encounter: Payer: Self-pay | Admitting: Internal Medicine

## 2019-11-26 DIAGNOSIS — R5383 Other fatigue: Secondary | ICD-10-CM | POA: Insufficient documentation

## 2019-12-09 DIAGNOSIS — I10 Essential (primary) hypertension: Secondary | ICD-10-CM | POA: Diagnosis not present

## 2019-12-09 DIAGNOSIS — M1990 Unspecified osteoarthritis, unspecified site: Secondary | ICD-10-CM | POA: Diagnosis not present

## 2019-12-09 DIAGNOSIS — E119 Type 2 diabetes mellitus without complications: Secondary | ICD-10-CM | POA: Diagnosis not present

## 2019-12-23 ENCOUNTER — Ambulatory Visit: Payer: Medicare Other | Admitting: Dietician

## 2019-12-23 ENCOUNTER — Encounter: Payer: Medicare Other | Admitting: Internal Medicine

## 2019-12-30 DIAGNOSIS — Z79899 Other long term (current) drug therapy: Secondary | ICD-10-CM | POA: Diagnosis not present

## 2019-12-30 DIAGNOSIS — Z1159 Encounter for screening for other viral diseases: Secondary | ICD-10-CM | POA: Diagnosis not present

## 2019-12-30 DIAGNOSIS — I10 Essential (primary) hypertension: Secondary | ICD-10-CM | POA: Diagnosis not present

## 2019-12-30 DIAGNOSIS — E119 Type 2 diabetes mellitus without complications: Secondary | ICD-10-CM | POA: Diagnosis not present

## 2020-01-10 ENCOUNTER — Other Ambulatory Visit: Payer: Self-pay

## 2020-01-10 DIAGNOSIS — E1169 Type 2 diabetes mellitus with other specified complication: Secondary | ICD-10-CM

## 2020-01-10 DIAGNOSIS — I10 Essential (primary) hypertension: Secondary | ICD-10-CM

## 2020-01-10 MED ORDER — AMLODIPINE BESY-BENAZEPRIL HCL 5-10 MG PO CAPS: 1.0000 | ORAL_CAPSULE | Freq: Every day | ORAL | 2 refills | Status: DC

## 2020-01-10 MED ORDER — ATORVASTATIN CALCIUM 40 MG PO TABS: 40.0000 mg | ORAL_TABLET | Freq: Every day | ORAL | 2 refills | Status: AC

## 2020-01-19 NOTE — Addendum Note (Signed)
Addended by: Hulan Fray on: 01/19/2020 06:26 PM   Modules accepted: Orders

## 2020-01-25 ENCOUNTER — Ambulatory Visit: Payer: Medicare Other | Attending: Family

## 2020-01-25 DIAGNOSIS — Z23 Encounter for immunization: Secondary | ICD-10-CM

## 2020-02-08 ENCOUNTER — Telehealth: Payer: Self-pay | Admitting: *Deleted

## 2020-02-08 NOTE — Telephone Encounter (Signed)
-----   Message from Angelica Pou, MD sent at 02/07/2020  3:28 PM EST ----- Regarding: schedule appt please Good day all, I would like Ms. Durfee to come in within the next several weeks for a recheck and to discuss starting vitamin D.  I could not find  a "front desk pool".  Thank you! Dr. Viona Gilmore.

## 2020-03-03 ENCOUNTER — Other Ambulatory Visit: Payer: Self-pay | Admitting: Student

## 2020-03-03 DIAGNOSIS — Z1231 Encounter for screening mammogram for malignant neoplasm of breast: Secondary | ICD-10-CM

## 2020-03-09 ENCOUNTER — Encounter: Payer: Medicare Other | Admitting: Internal Medicine

## 2020-03-15 NOTE — Progress Notes (Signed)
   Covid-19 Vaccination Clinic  Name:  [name]    MRN: [id number] DOB: 29-Aug-1955  03/15/2020  [name] was observed post Covid-19 immunization for 15 minutes without incident. She was provided with Vaccine Information Sheet and instruction to access the V-Safe system.   [name] was instructed to call 911 with any severe reactions post vaccine: Marland Kitchen Difficulty breathing  . Swelling of face and throat  . A fast heartbeat  . A bad rash all over body  . Dizziness and weakness   Immunizations Administered    No immunizations on file.

## 2020-04-05 ENCOUNTER — Encounter: Payer: Self-pay | Admitting: Internal Medicine

## 2020-04-05 DIAGNOSIS — E559 Vitamin D deficiency, unspecified: Secondary | ICD-10-CM | POA: Insufficient documentation

## 2020-04-14 ENCOUNTER — Other Ambulatory Visit: Payer: Self-pay

## 2020-04-14 ENCOUNTER — Ambulatory Visit
Admission: RE | Admit: 2020-04-14 | Discharge: 2020-04-14 | Disposition: A | Discharge status: Home / Self Care | Payer: Medicare Other | Source: Ambulatory Visit | Attending: Student | Admitting: Student

## 2020-04-14 DIAGNOSIS — Z1231 Encounter for screening mammogram for malignant neoplasm of breast: Secondary | ICD-10-CM

## 2020-09-26 ENCOUNTER — Other Ambulatory Visit (HOSPITAL_COMMUNITY): Payer: Self-pay | Admitting: Student

## 2020-09-26 DIAGNOSIS — I509 Heart failure, unspecified: Secondary | ICD-10-CM

## 2020-09-26 DIAGNOSIS — R0989 Other specified symptoms and signs involving the circulatory and respiratory systems: Secondary | ICD-10-CM

## 2020-10-12 ENCOUNTER — Ambulatory Visit (HOSPITAL_COMMUNITY)
Admission: RE | Admit: 2020-10-12 | Discharge: 2020-10-12 | Disposition: A | Discharge status: Home / Self Care | Payer: 59 | Source: Ambulatory Visit | Attending: Student | Admitting: Student

## 2020-10-12 ENCOUNTER — Other Ambulatory Visit: Payer: Self-pay

## 2020-10-12 DIAGNOSIS — I110 Hypertensive heart disease with heart failure: Secondary | ICD-10-CM | POA: Diagnosis not present

## 2020-10-12 DIAGNOSIS — I509 Heart failure, unspecified: Secondary | ICD-10-CM | POA: Diagnosis not present

## 2020-10-12 DIAGNOSIS — E119 Type 2 diabetes mellitus without complications: Secondary | ICD-10-CM | POA: Insufficient documentation

## 2020-10-12 DIAGNOSIS — I361 Nonrheumatic tricuspid (valve) insufficiency: Secondary | ICD-10-CM

## 2020-10-12 DIAGNOSIS — E785 Hyperlipidemia, unspecified: Secondary | ICD-10-CM | POA: Diagnosis not present

## 2020-10-12 DIAGNOSIS — R0989 Other specified symptoms and signs involving the circulatory and respiratory systems: Secondary | ICD-10-CM | POA: Diagnosis not present

## 2020-10-12 LAB — ECHOCARDIOGRAM COMPLETE
Area-P 1/2: 3.17 cm2
S' Lateral: 2.7 cm

## 2021-07-09 DIAGNOSIS — M19012 Primary osteoarthritis, left shoulder: Secondary | ICD-10-CM | POA: Insufficient documentation

## 2021-08-08 NOTE — Telephone Encounter (Signed)
Open error 

## 2021-08-23 ENCOUNTER — Other Ambulatory Visit: Payer: Self-pay | Admitting: Student

## 2021-08-23 ENCOUNTER — Ambulatory Visit
Admission: RE | Admit: 2021-08-23 | Discharge: 2021-08-23 | Disposition: A | Discharge status: Home / Self Care | Payer: 59 | Source: Ambulatory Visit | Attending: Student | Admitting: Student

## 2021-08-23 DIAGNOSIS — M25561 Pain in right knee: Secondary | ICD-10-CM

## 2022-03-05 ENCOUNTER — Other Ambulatory Visit: Payer: Self-pay | Admitting: Student

## 2022-03-05 DIAGNOSIS — E2839 Other primary ovarian failure: Secondary | ICD-10-CM

## 2022-05-14 ENCOUNTER — Ambulatory Visit
Admission: RE | Admit: 2022-05-14 | Discharge: 2022-05-14 | Disposition: A | Discharge status: Home / Self Care | Payer: 59 | Source: Ambulatory Visit | Attending: Student | Admitting: Student

## 2022-05-14 DIAGNOSIS — E2839 Other primary ovarian failure: Secondary | ICD-10-CM

## 2022-07-18 ENCOUNTER — Encounter (HOSPITAL_BASED_OUTPATIENT_CLINIC_OR_DEPARTMENT_OTHER): Payer: Self-pay | Admitting: Emergency Medicine

## 2022-07-18 ENCOUNTER — Other Ambulatory Visit: Payer: Self-pay

## 2022-07-18 ENCOUNTER — Emergency Department (HOSPITAL_BASED_OUTPATIENT_CLINIC_OR_DEPARTMENT_OTHER)
Admission: EM | Admit: 2022-07-18 | Discharge: 2022-07-18 | Disposition: A | Discharge status: Home / Self Care | Payer: 59 | Attending: Emergency Medicine | Admitting: Emergency Medicine

## 2022-07-18 ENCOUNTER — Emergency Department (HOSPITAL_COMMUNITY): Admission: EM | Admit: 2022-07-18 | Discharge: 2022-07-18 | Discharge status: Against Medical Advice | Payer: 59

## 2022-07-18 ENCOUNTER — Emergency Department (HOSPITAL_BASED_OUTPATIENT_CLINIC_OR_DEPARTMENT_OTHER): Payer: 59

## 2022-07-18 DIAGNOSIS — Z7984 Long term (current) use of oral hypoglycemic drugs: Secondary | ICD-10-CM | POA: Diagnosis not present

## 2022-07-18 DIAGNOSIS — R519 Headache, unspecified: Secondary | ICD-10-CM | POA: Diagnosis present

## 2022-07-18 DIAGNOSIS — E119 Type 2 diabetes mellitus without complications: Secondary | ICD-10-CM | POA: Insufficient documentation

## 2022-07-18 DIAGNOSIS — K029 Dental caries, unspecified: Secondary | ICD-10-CM | POA: Insufficient documentation

## 2022-07-18 DIAGNOSIS — Z20822 Contact with and (suspected) exposure to covid-19: Secondary | ICD-10-CM | POA: Insufficient documentation

## 2022-07-18 DIAGNOSIS — M5412 Radiculopathy, cervical region: Secondary | ICD-10-CM | POA: Diagnosis not present

## 2022-07-18 LAB — CBC WITH DIFFERENTIAL/PLATELET
Abs Immature Granulocytes: 0.01 10*3/uL (ref 0.00–0.07)
Basophils Absolute: 0.1 10*3/uL (ref 0.0–0.1)
Basophils Relative: 1 %
Eosinophils Absolute: 0.1 10*3/uL (ref 0.0–0.5)
Eosinophils Relative: 2 %
HCT: 40 % (ref 36.0–46.0)
Hemoglobin: 13.3 g/dL (ref 12.0–15.0)
Immature Granulocytes: 0 %
Lymphocytes Relative: 31 %
Lymphs Abs: 2.2 10*3/uL (ref 0.7–4.0)
MCH: 30.8 pg (ref 26.0–34.0)
MCHC: 33.3 g/dL (ref 30.0–36.0)
MCV: 92.6 fL (ref 80.0–100.0)
Monocytes Absolute: 0.4 10*3/uL (ref 0.1–1.0)
Monocytes Relative: 5 %
Neutro Abs: 4.2 10*3/uL (ref 1.7–7.7)
Neutrophils Relative %: 61 %
Platelets: 227 10*3/uL (ref 150–400)
RBC: 4.32 MIL/uL (ref 3.87–5.11)
RDW: 13.7 % (ref 11.5–15.5)
WBC: 7 10*3/uL (ref 4.0–10.5)
nRBC: 0 % (ref 0.0–0.2)

## 2022-07-18 LAB — BASIC METABOLIC PANEL
Anion gap: 8 (ref 5–15)
BUN: 10 mg/dL (ref 8–23)
CO2: 26 mmol/L (ref 22–32)
Calcium: 9.3 mg/dL (ref 8.9–10.3)
Chloride: 104 mmol/L (ref 98–111)
Creatinine, Ser: 0.8 mg/dL (ref 0.44–1.00)
GFR, Estimated: >60 mL/min (ref >60)
Glucose, Bld: 141 mg/dL — ABNORMAL HIGH (ref 70–99)
Potassium: 3.9 mmol/L (ref 3.5–5.1)
Sodium: 138 mmol/L (ref 135–145)

## 2022-07-18 LAB — SARS CORONAVIRUS 2 BY RT PCR: SARS Coronavirus 2 by RT PCR: NEGATIVE

## 2022-07-18 MED ORDER — HYDROCODONE-ACETAMINOPHEN 5-325 MG PO TABS: 1.0000 | ORAL_TABLET | ORAL | 0 refills | Status: DC | PRN

## 2022-07-18 MED ORDER — AMOXICILLIN 500 MG PO CAPS: 500.0000 mg | ORAL_CAPSULE | Freq: Three times a day (TID) | ORAL | 0 refills | Status: DC

## 2022-07-18 MED ORDER — KETOROLAC TROMETHAMINE 30 MG/ML IJ SOLN
30.0000 mg | Freq: Once | INTRAMUSCULAR | Status: AC
  Administered 2022-07-18: 30 mg via INTRAVENOUS
  Filled 2022-07-18: qty 1

## 2022-07-18 MED ORDER — AMOXICILLIN 500 MG PO CAPS
500.0000 mg | ORAL_CAPSULE | Freq: Once | ORAL | Status: AC
  Administered 2022-07-18: 500 mg via ORAL
  Filled 2022-07-18: qty 1

## 2022-07-18 MED ORDER — PREDNISONE 10 MG (21) PO TBPK: ORAL_TABLET | Freq: Every day | ORAL | 0 refills | Status: DC

## 2022-07-18 MED ORDER — MORPHINE SULFATE (PF) 4 MG/ML IV SOLN
4.0000 mg | Freq: Once | INTRAVENOUS | Status: AC
  Administered 2022-07-18: 4 mg via INTRAVENOUS
  Filled 2022-07-18: qty 1

## 2022-07-18 MED ORDER — SODIUM CHLORIDE 0.9 % IV BOLUS
1000.0000 mL | Freq: Once | INTRAVENOUS | Status: AC
  Administered 2022-07-18: 1000 mL via INTRAVENOUS

## 2022-07-18 MED ORDER — ONDANSETRON HCL 4 MG/2ML IJ SOLN
4.0000 mg | Freq: Once | INTRAMUSCULAR | Status: AC
  Administered 2022-07-18: 4 mg via INTRAVENOUS
  Filled 2022-07-18: qty 2

## 2022-07-18 NOTE — ED Notes (Signed)
DC papers reviewed. No questions or concerns. No signs of distress. Pt assisted to wheelchair and out to lobby. Appropriate measures for safety taken. 

## 2022-07-18 NOTE — ED Triage Notes (Signed)
Reports pains in right side of head into neck and arm. Started this morning. Denies n/v denies vision changes Reports feeling weak. Able to move all extremities, clear fluent speech

## 2022-07-18 NOTE — ED Provider Notes (Signed)
[place] EMERGENCY DEPARTMENT AT [facility] Provider Note   CSN: [id number] Arrival date & time: 07/18/22  1913     History  Chief Complaint  Patient presents with   Headache    [name] is a 67 y.o. female.  Pt is a 67 yo female with pmhx significant for hld, dm2, pituitary tumor, and obesity.  Pt has been having pains in the right side of her head.  It radiates into her neck and right arm.  She denies any other sx.  She said sx have been going on for a few days.        Home Medications Prior to Admission medications   Medication Sig Start Date End Date Taking? Authorizing Provider  amoxicillin (AMOXIL) 500 MG capsule Take 1 capsule (500 mg total) by mouth 3 (three) times daily. 07/18/22  Yes Jacalyn Lefevre, MD  HYDROcodone-acetaminophen (NORCO/VICODIN) 5-325 MG tablet Take 1 tablet by mouth every 4 (four) hours as needed. 07/18/22  Yes Jacalyn Lefevre, MD  predniSONE (STERAPRED UNI-PAK 21 TAB) 10 MG (21) TBPK tablet Take by mouth daily. Take 6 tabs by mouth daily  for 2 days, then 5 tabs for 2 days, then 4 tabs for 2 days, then 3 tabs for 2 days, 2 tabs for 2 days, then 1 tab by mouth daily for 2 days 07/18/22  Yes Jacalyn Lefevre, MD  amLODipine-benazepril (LOTREL) 5-10 MG capsule Take 1 capsule by mouth daily. 01/10/20 10/06/20  Miguel Aschoff, MD  atorvastatin (LIPITOR) 40 MG tablet Take 1 tablet (40 mg total) by mouth daily. 01/10/20   Miguel Aschoff, MD  Blood Glucose Monitoring Suppl [facility] VERIO) w/Device KIT 1 Units by Does not apply route as needed. 05/17/16   Beather Arbour, MD  glucose blood (ONETOUCH VERIO) test strip Use as instructed 05/17/16   Beather Arbour, MD  [facility] DELICA LANCETS 33G MISC Please use as directed. 05/17/16   Beather Arbour, MD      Allergies    Glipizide and Metformin and related    Review of Systems   Review of Systems  Neurological:  Positive for headaches.  All other systems reviewed and are  negative.   Physical Exam Updated Vital Signs BP 101/73   Pulse (!) 58   Temp 97.9 F (36.6 C) (Oral)   Resp 16   SpO2 93%  Physical Exam Vitals and nursing note reviewed.  Constitutional:      Appearance: She is well-developed. She is obese.  HENT:     Head: Normocephalic and atraumatic.     Comments: Multiple missing teeth + dental caries    Mouth/Throat:     Mouth: Mucous membranes are moist.     Pharynx: Oropharynx is clear.  Eyes:     Extraocular Movements: Extraocular movements intact.     Pupils: Pupils are equal, round, and reactive to light.  Cardiovascular:     Rate and Rhythm: Normal rate and regular rhythm.     Heart sounds: Normal heart sounds.  Pulmonary:     Effort: Pulmonary effort is normal.     Breath sounds: Normal breath sounds.  Abdominal:     General: Bowel sounds are normal.     Palpations: Abdomen is soft.  Musculoskeletal:        General: Normal range of motion.     Cervical back: Normal range of motion and neck supple.  Skin:    General: Skin is warm and dry.  Neurological:  Mental Status: She is alert and oriented to person, place, and time.  Psychiatric:        Mood and Affect: Mood normal.        Speech: Speech normal.        Behavior: Behavior normal.     ED Results / Procedures / Treatments   Labs (all labs ordered are listed, but only abnormal results are displayed) Labs Reviewed  BASIC METABOLIC PANEL - Abnormal; Notable for the following components:      Result Value   Glucose, Bld 141 (*)    All other components within normal limits  SARS CORONAVIRUS 2 BY RT PCR  CBC WITH DIFFERENTIAL/PLATELET    EKG None  Radiology CT Head Wo Contrast  Result Date: 07/18/2022 CLINICAL DATA:  Neuro deficit, acute, stroke suspected Patient reports right side of head and neck pain. EXAM: CT HEAD WITHOUT CONTRAST TECHNIQUE: Contiguous axial images were obtained from the base of the skull through the vertex without intravenous  contrast. RADIATION DOSE REDUCTION: This exam was performed according to the departmental dose-optimization program which includes automated exposure control, adjustment of the mA and/or kV according to patient size and/or use of iterative reconstruction technique. COMPARISON:  Head CT 03/03/2014 FINDINGS: Brain: No intracranial hemorrhage, mass effect, or midline shift. No hydrocephalus. The basilar cisterns are patent. No evidence of territorial infarct or acute ischemia. No extra-axial or intracranial fluid collection. Vascular: Atherosclerosis of skullbase vasculature without hyperdense vessel or abnormal calcification. Skull: Abnormal appearance of the skull base which is stable from 2015 exam and may be related to prior surgery. No fracture. Sinuses/Orbits: Chronic opacification of right side of sphenoid sinus. No acute findings. Other: None. IMPRESSION: No acute intracranial abnormality. Electronically Signed   By: Narda Rutherford M.D.   On: 07/18/2022 20:58    Procedures Procedures    Medications Ordered in ED Medications  sodium chloride 0.9 % bolus 1,000 mL (0 mLs Intravenous Stopped 07/18/22 2312)  ketorolac (TORADOL) 30 MG/ML injection 30 mg (30 mg Intravenous Given 07/18/22 2041)  ondansetron (ZOFRAN) injection 4 mg (4 mg Intravenous Given 07/18/22 2040)  morphine (PF) 4 MG/ML injection 4 mg (4 mg Intravenous Given 07/18/22 2128)  amoxicillin (AMOXIL) capsule 500 mg (500 mg Oral Given 07/18/22 2316)    ED Course/ Medical Decision Making/ A&P                             Medical Decision Making Amount and/or Complexity of Data Reviewed Labs: ordered. Radiology: ordered.  Risk Prescription drug management.   This patient presents to the ED for concern of headache/neck pain, this involves an extensive number of treatment options, and is a complaint that carries with it a high risk of complications and morbidity.  The differential diagnosis includes ich, cervical radiculopathy, dental  caries   Co morbidities that complicate the patient evaluation  hld, dm2, pituitary tumor, and obesity   Additional history obtained:  Additional history obtained from epic chart review External records from outside source obtained and reviewed including family   Lab Tests:  I Ordered, and personally interpreted labs.  The pertinent results include:  cbc nl, bmp nl, covid neg   Imaging Studies ordered:  I ordered imaging studies including ct head  I independently visualized and interpreted imaging which showed No acute intracranial abnormality.  I agree with the radiologist interpretation   Cardiac Monitoring:  The patient was maintained on a cardiac monitor.  I personally  viewed and interpreted the cardiac monitored which showed an underlying rhythm of: nsr   Medicines ordered and prescription drug management:  I ordered medication including ivfs, toradol, morphine, zofran  for sx  Reevaluation of the patient after these medicines showed that the patient improved I have reviewed the patients home medicines and have made adjustments as needed   Test Considered:  ct   Critical Interventions:  Pain control   Problem List / ED Course:  Headache with neck pain:  likely cervical radiculopathy, but could be from dental caries.  She has no neuro sx.  Pt d/c with a prednisone taper, amox, and lortab.  She needs to f/u with pcp and she's given the number to the dentist on call.  She is stable for d/c.  Return if worse.    Reevaluation:  After the interventions noted above, I reevaluated the patient and found that they have :improved   Social Determinants of Health:  Lives at home   Dispostion:  After consideration of the diagnostic results and the patients response to treatment, I feel that the patent would benefit from discharge with outpatient f/u.          Final Clinical Impression(s) / ED Diagnoses Final diagnoses:  Cervical radiculopathy  Dental  caries    Rx / DC Orders ED Discharge Orders          Ordered    amoxicillin (AMOXIL) 500 MG capsule  3 times daily        07/18/22 2305    HYDROcodone-acetaminophen (NORCO/VICODIN) 5-325 MG tablet  Every 4 hours PRN        07/18/22 2306    predniSONE (STERAPRED UNI-PAK 21 TAB) 10 MG (21) TBPK tablet  Daily        07/18/22 2306              Jacalyn Lefevre, MD 07/19/22 1622

## 2022-07-18 NOTE — ED Notes (Signed)
Pt. Called for triage 3 times w/ no response. Registration stated she thought she saw patient walk out the door with a walker. Moved to [employer]

## 2022-07-18 NOTE — ED Notes (Signed)
Out to CT 

## 2022-07-19 IMAGING — CR DG KNEE 1-2V*L*
2 series · 2 of 2 positions shown · non-contrast
Comparison: None Available.

CLINICAL DATA: Bilateral knee pain.

EXAM:
LEFT KNEE - 1-2 VIEW

[w knee ap left *]
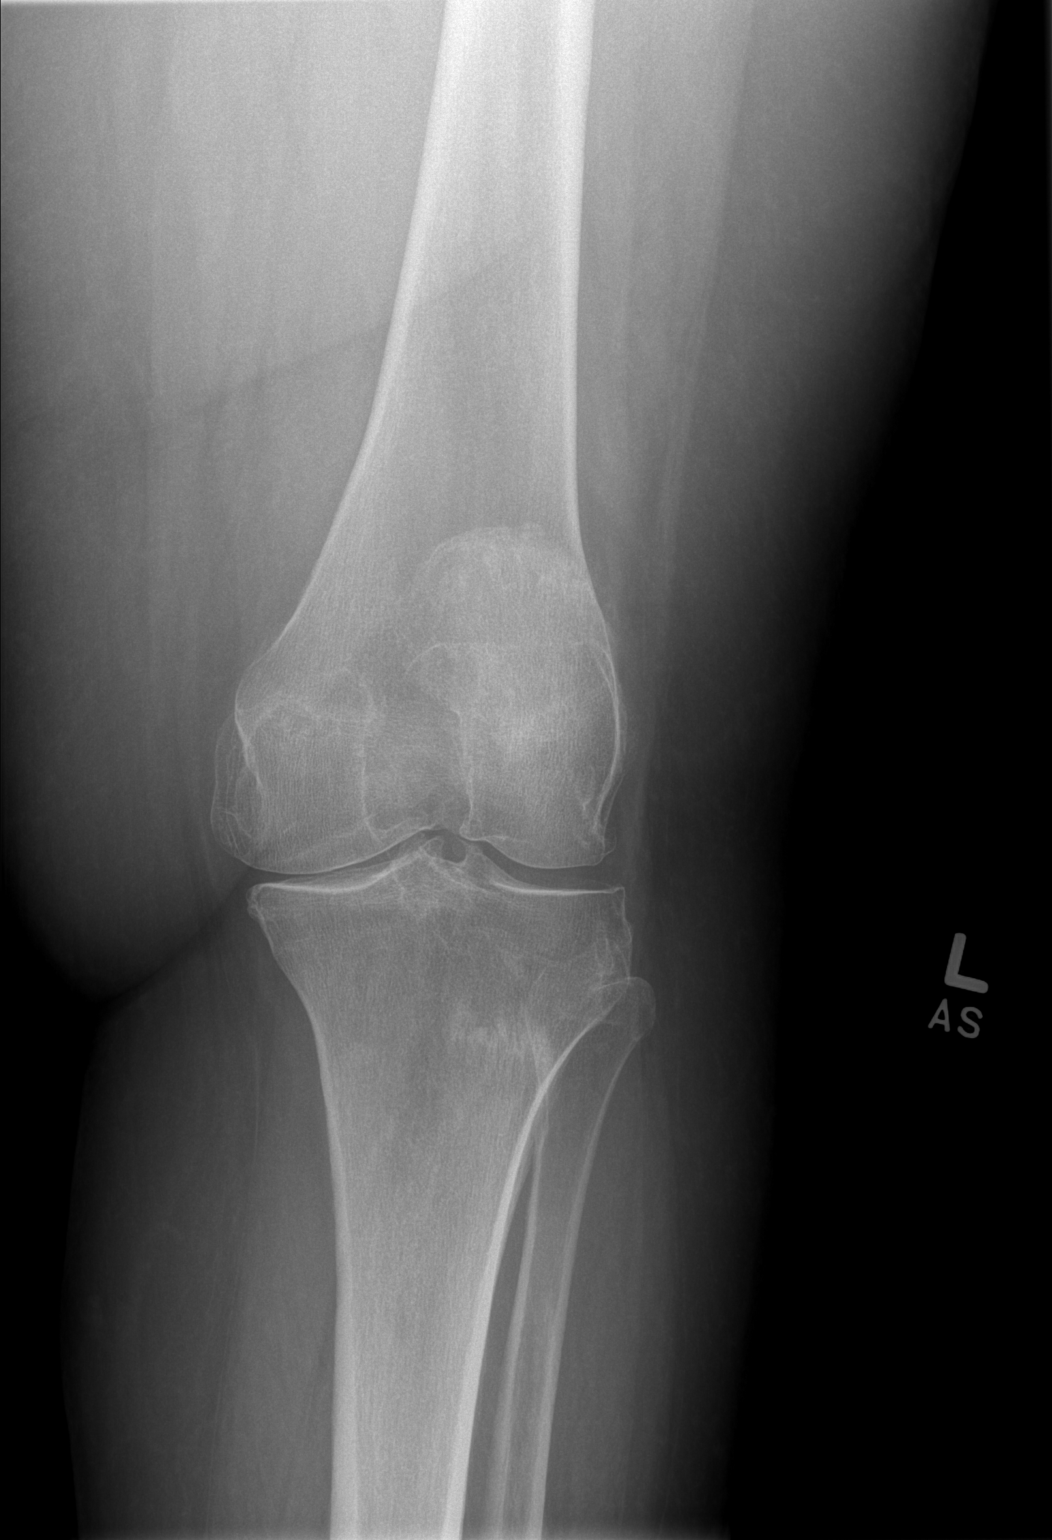

[w knee lat. left *]
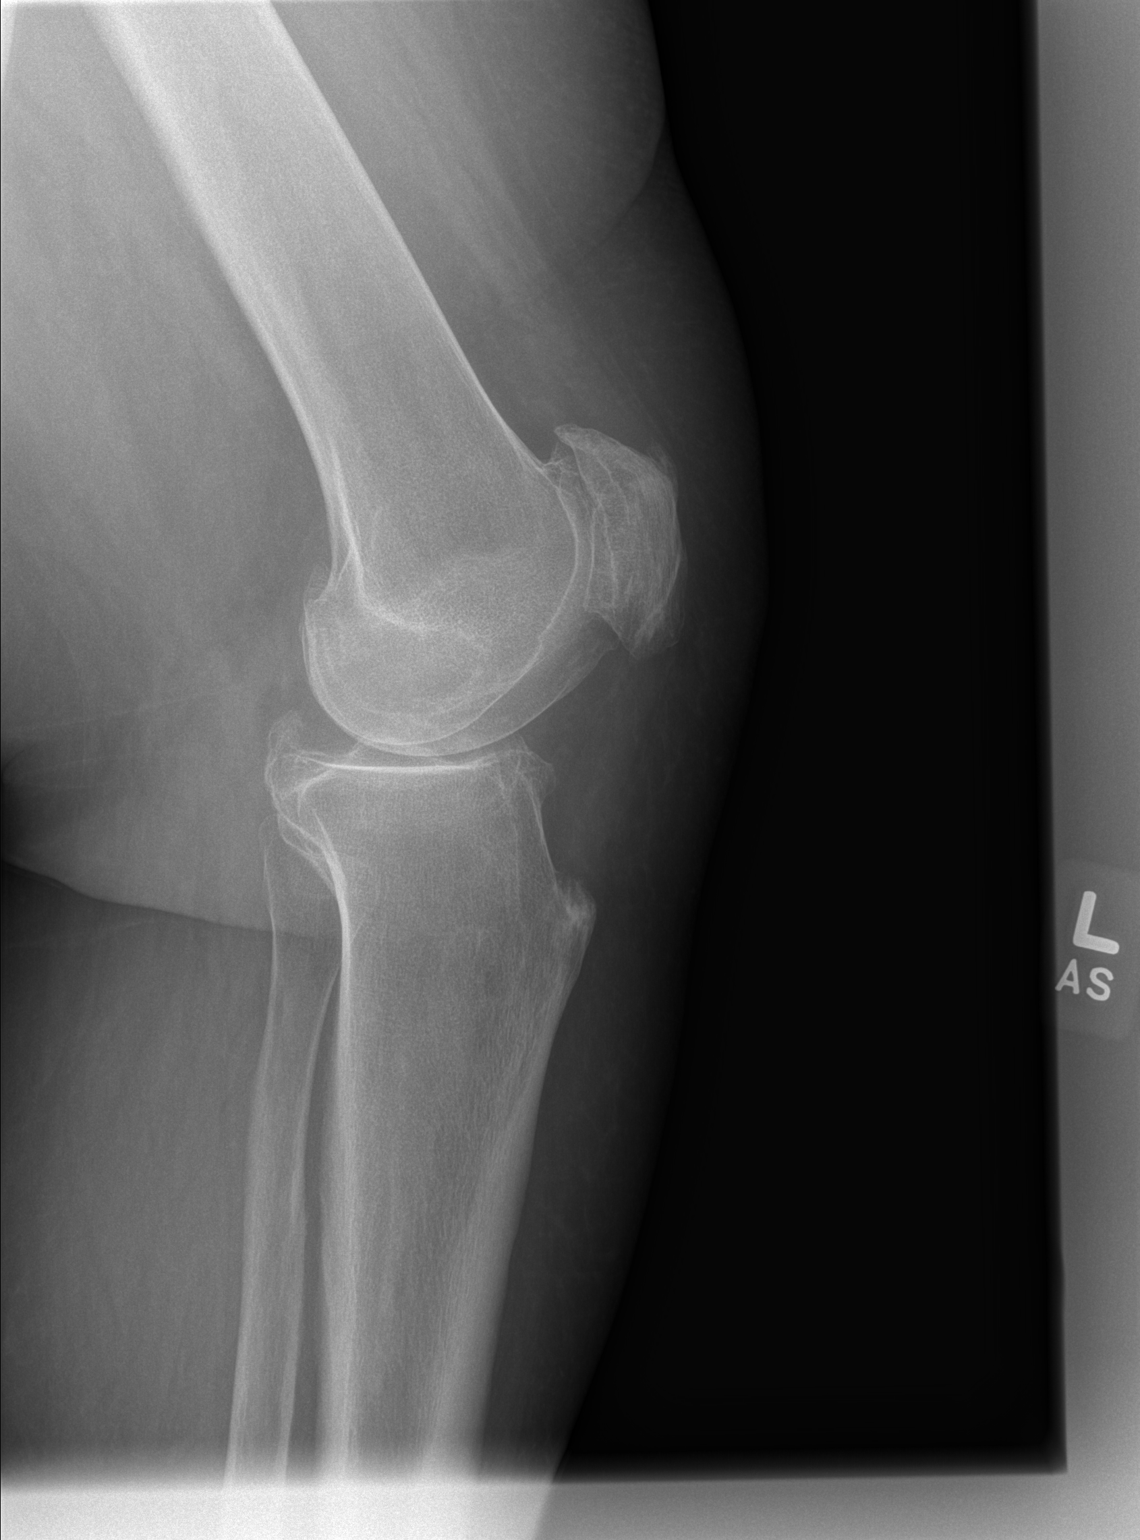

[2 of 2 positions shown; findings below may reference images not displayed]

FINDINGS: There is no acute fracture or dislocation. There is mild medial and
patellofemoral compartment joint space narrowing. There is mild
tricompartmental osteophyte formation. Soft tissues are within
normal limits. No joint effusion identified.
IMPRESSION: 1. No acute bony abnormality.
2. Mild tricompartmental osteoarthrosis.

## 2022-07-19 IMAGING — CR DG KNEE 1-2V*R*
2 series · 2 of 2 positions shown · non-contrast
Comparison: None Available.

CLINICAL DATA: Knee pain.

EXAM:
RIGHT KNEE - 1-2 VIEW

[w knee ap right *]
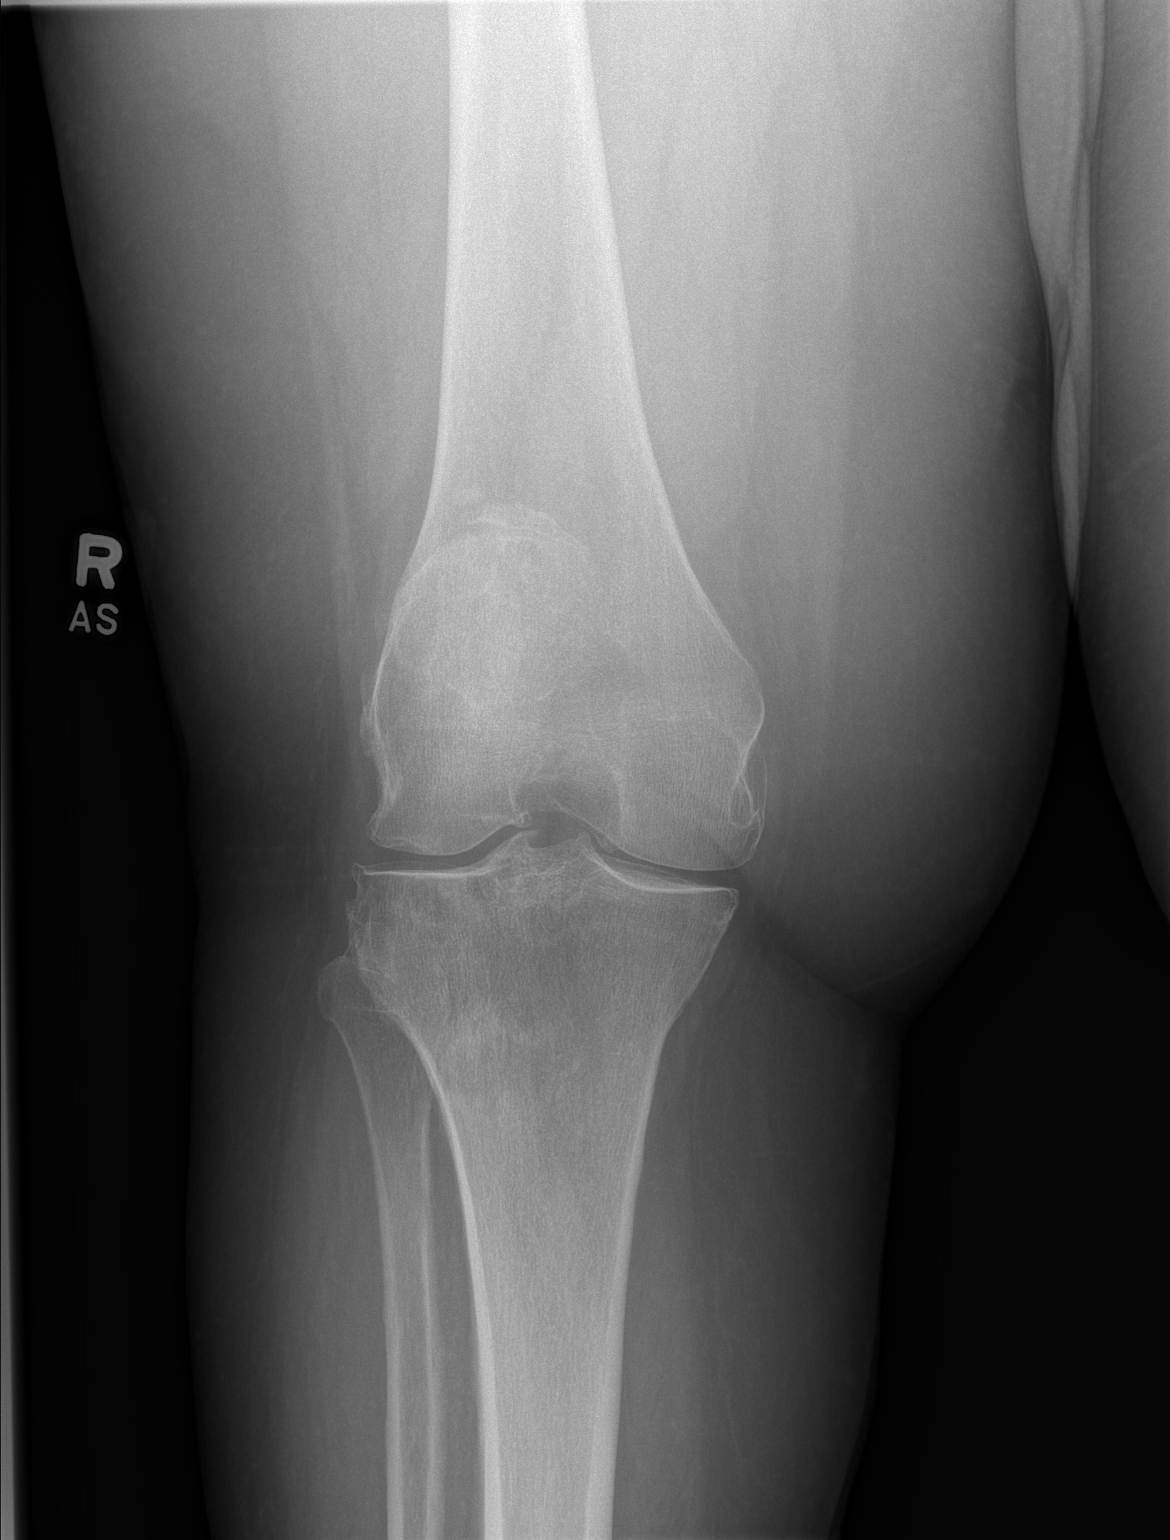

[w knee lat. right]
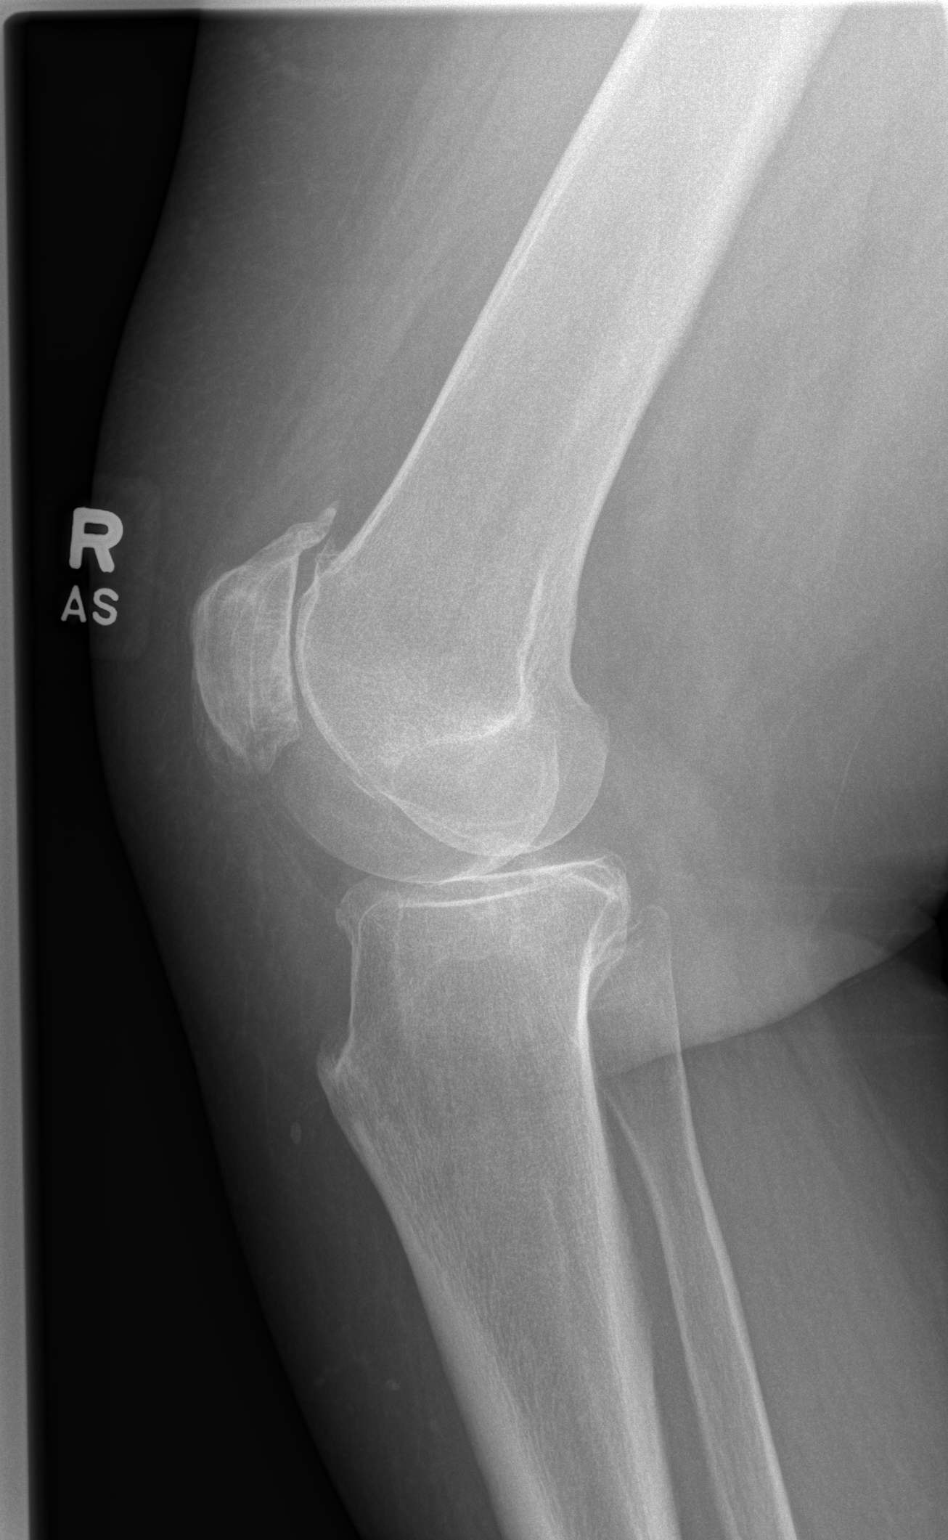

[2 of 2 positions shown; findings below may reference images not displayed]

FINDINGS: There is moderate patellofemoral compartment joint space narrowing.
There is tricompartmental osteophyte formation. There is no acute
fracture or dislocation. Superior patellar spur is present. Soft
tissues are within normal limits. No joint effusion.
IMPRESSION: 1. No acute bony abnormality.
2. Mild-to-moderate tricompartmental osteoarthrosis.

## 2022-11-10 ENCOUNTER — Encounter (HOSPITAL_COMMUNITY): Payer: Self-pay

## 2022-11-10 ENCOUNTER — Emergency Department (HOSPITAL_COMMUNITY)
Admission: EM | Admit: 2022-11-10 | Discharge: 2022-11-10 | Disposition: A | Discharge status: Home / Self Care | Payer: 59 | Attending: Emergency Medicine | Admitting: Emergency Medicine

## 2022-11-10 ENCOUNTER — Other Ambulatory Visit: Payer: Self-pay

## 2022-11-10 ENCOUNTER — Emergency Department (HOSPITAL_COMMUNITY): Payer: 59

## 2022-11-10 DIAGNOSIS — Z79899 Other long term (current) drug therapy: Secondary | ICD-10-CM | POA: Insufficient documentation

## 2022-11-10 DIAGNOSIS — M7989 Other specified soft tissue disorders: Secondary | ICD-10-CM | POA: Diagnosis not present

## 2022-11-10 DIAGNOSIS — R051 Acute cough: Secondary | ICD-10-CM | POA: Diagnosis not present

## 2022-11-10 DIAGNOSIS — R059 Cough, unspecified: Secondary | ICD-10-CM | POA: Diagnosis present

## 2022-11-10 DIAGNOSIS — Z20822 Contact with and (suspected) exposure to covid-19: Secondary | ICD-10-CM | POA: Diagnosis not present

## 2022-11-10 DIAGNOSIS — I10 Essential (primary) hypertension: Secondary | ICD-10-CM | POA: Diagnosis not present

## 2022-11-10 DIAGNOSIS — R0602 Shortness of breath: Secondary | ICD-10-CM | POA: Diagnosis not present

## 2022-11-10 DIAGNOSIS — E119 Type 2 diabetes mellitus without complications: Secondary | ICD-10-CM | POA: Diagnosis not present

## 2022-11-10 LAB — TROPONIN I (HIGH SENSITIVITY)
Troponin I (High Sensitivity): 3 ng/L (ref <18)
Troponin I (High Sensitivity): 5 ng/L (ref <18)

## 2022-11-10 LAB — RESP PANEL BY RT-PCR (RSV, FLU A&B, COVID)  RVPGX2
Influenza A by PCR: NEGATIVE
Influenza B by PCR: NEGATIVE
Resp Syncytial Virus by PCR: NEGATIVE
SARS Coronavirus 2 by RT PCR: NEGATIVE

## 2022-11-10 LAB — CBC
HCT: 39.9 % (ref 36.0–46.0)
Hemoglobin: 13.5 g/dL (ref 12.0–15.0)
MCH: 32.1 pg (ref 26.0–34.0)
MCHC: 33.8 g/dL (ref 30.0–36.0)
MCV: 94.8 fL (ref 80.0–100.0)
Platelets: 219 10*3/uL (ref 150–400)
RBC: 4.21 MIL/uL (ref 3.87–5.11)
RDW: 13.7 % (ref 11.5–15.5)
WBC: 7.1 10*3/uL (ref 4.0–10.5)
nRBC: 0 % (ref 0.0–0.2)

## 2022-11-10 LAB — BASIC METABOLIC PANEL
Anion gap: 10 (ref 5–15)
BUN: 11 mg/dL (ref 8–23)
CO2: 25 mmol/L (ref 22–32)
Calcium: 9.1 mg/dL (ref 8.9–10.3)
Chloride: 104 mmol/L (ref 98–111)
Creatinine, Ser: 1.37 mg/dL — ABNORMAL HIGH (ref 0.44–1.00)
GFR, Estimated: 42 mL/min — ABNORMAL LOW (ref >60)
Glucose, Bld: 132 mg/dL — ABNORMAL HIGH (ref 70–99)
Potassium: 4.3 mmol/L (ref 3.5–5.1)
Sodium: 139 mmol/L (ref 135–145)

## 2022-11-10 LAB — BRAIN NATRIURETIC PEPTIDE: B Natriuretic Peptide: 19.7 pg/mL (ref 0.0–100.0)

## 2022-11-10 MED ORDER — SODIUM CHLORIDE 0.9 % IV BOLUS
500.0000 mL | Freq: Once | INTRAVENOUS | Status: AC
  Administered 2022-11-10: 500 mL via INTRAVENOUS

## 2022-11-10 MED ORDER — BENZONATATE 100 MG PO CAPS: 100.0000 mg | ORAL_CAPSULE | Freq: Three times a day (TID) | ORAL | 0 refills | Status: DC | PRN

## 2022-11-10 MED ORDER — BENZONATATE 100 MG PO CAPS: 100.0000 mg | ORAL_CAPSULE | Freq: Once | ORAL | Status: DC

## 2022-11-10 NOTE — ED Notes (Signed)
Patient transported to X-ray 

## 2022-11-10 NOTE — ED Notes (Signed)
Pt ambulated in the hall. Pt's O2 sat remained 96-98%. Heart Rate remained 95-98. Pt's gait is steady and is able to walk independently.

## 2022-11-10 NOTE — ED Triage Notes (Signed)
Pt bib ems from home c/o cough that started a week ago. Pt noticed green sputum and sob when coughing. EMS noted pt in AFIB rhythm with no history listed in chart. Hx HTN and DM2  BP 125/79 HR 88 RA 86% applied 2L nasal canula 96% CBG 127

## 2022-11-10 NOTE — ED Provider Notes (Signed)
[place] EMERGENCY DEPARTMENT AT [facility] Provider Note   CSN: [id number] Arrival date & time: 11/10/22  1512     History  Chief Complaint  Patient presents with   Cough    [name] is a 67 y.o. female with medical history of type 2 diabetes, hyperlipidemia, hypertension, hemorrhoids, memory impairment, TIA.  Patient presents to ED for evaluation of shortness of breath and cough.  Patient reports that for the last 1 week she has had a progressively worsening cough associated with shortness of breath.  She states that every day this past week she has felt short of breath but today she sprayed an aerosol spray into the air and it caused her shortness of breath to acutely worsen.  She reports that she became panicked and called 911 as a result.  She states that she has had a productive cough with green sputum for the last 3 days.  She denies fevers, sick contacts, bodyaches or chills, sore throat, lightheadedness, dizziness, weakness.  She is also complaining of leg swelling that occurred last week, no leg swelling this week.  She denies a history of CHF, atrial fibrillation.  Per triage note, she was apparently in A-fib on EMS arrival and hypoxic.  Here in the department, the patient is in normal sinus rhythm and her oxygen saturation is 99% on room air.  She denies any chest pain associated with this event.  Denies syncope.   Cough      Home Medications Prior to Admission medications   Medication Sig Start Date End Date Taking? Authorizing Provider  benzonatate (TESSALON) 100 MG capsule Take 1 capsule (100 mg total) by mouth every 8 (eight) hours as needed for cough. 11/10/22  Yes Al Decant, PA-C  amLODipine-benazepril (LOTREL) 5-10 MG capsule Take 1 capsule by mouth daily. 01/10/20 10/06/20  Miguel Aschoff, MD  amoxicillin (AMOXIL) 500 MG capsule Take 1 capsule (500 mg total) by mouth 3 (three) times daily. 07/18/22   Jacalyn Lefevre, MD  atorvastatin  (LIPITOR) 40 MG tablet Take 1 tablet (40 mg total) by mouth daily. 01/10/20   Miguel Aschoff, MD  Blood Glucose Monitoring Suppl [facility] VERIO) w/Device KIT 1 Units by Does not apply route as needed. 05/17/16   Beather Arbour, MD  glucose blood (ONETOUCH VERIO) test strip Use as instructed 05/17/16   Beather Arbour, MD  HYDROcodone-acetaminophen (NORCO/VICODIN) 5-325 MG tablet Take 1 tablet by mouth every 4 (four) hours as needed. 07/18/22   Jacalyn Lefevre, MD  [facility] DELICA LANCETS 33G MISC Please use as directed. 05/17/16   Beather Arbour, MD  predniSONE (STERAPRED UNI-PAK 21 TAB) 10 MG (21) TBPK tablet Take by mouth daily. Take 6 tabs by mouth daily  for 2 days, then 5 tabs for 2 days, then 4 tabs for 2 days, then 3 tabs for 2 days, 2 tabs for 2 days, then 1 tab by mouth daily for 2 days 07/18/22   Jacalyn Lefevre, MD      Allergies    Glipizide and Metformin and related    Review of Systems   Review of Systems  Respiratory:  Positive for cough.     Physical Exam Updated Vital Signs BP 135/72 (BP Location: Left Arm)   Pulse 95   Temp 98.2 F (36.8 C) (Oral)   Resp 19   Ht 5\' 1"  (1.549 m)   Wt 106.1 kg   SpO2 96%   BMI 44.21 kg/m  Physical Exam Vitals  and nursing note reviewed.  Constitutional:      General: She is not in acute distress.    Appearance: Normal appearance. She is not ill-appearing, toxic-appearing or diaphoretic.  HENT:     Head: Normocephalic and atraumatic.     Nose: Nose normal.     Mouth/Throat:     Mouth: Mucous membranes are moist.     Pharynx: Oropharynx is clear.  Eyes:     Extraocular Movements: Extraocular movements intact.     Conjunctiva/sclera: Conjunctivae normal.     Pupils: Pupils are equal, round, and reactive to light.  Cardiovascular:     Rate and Rhythm: Normal rate and regular rhythm.  Pulmonary:     Effort: Pulmonary effort is normal.     Breath sounds: Normal breath sounds. No wheezing.  Abdominal:     General: Abdomen  is flat. Bowel sounds are normal.     Palpations: Abdomen is soft.     Tenderness: There is no abdominal tenderness.  Musculoskeletal:     Cervical back: Normal range of motion and neck supple. No tenderness.     Right lower leg: No edema.     Left lower leg: No edema.  Skin:    General: Skin is warm and dry.     Capillary Refill: Capillary refill takes less than 2 seconds.  Neurological:     General: No focal deficit present.     Mental Status: She is alert and oriented to person, place, and time. Mental status is at baseline.     Cranial Nerves: Cranial nerves 2-12 are intact. No cranial nerve deficit.     Sensory: Sensation is intact. No sensory deficit.     Motor: Motor function is intact. No weakness.     ED Results / Procedures / Treatments   Labs (all labs ordered are listed, but only abnormal results are displayed) Labs Reviewed  BASIC METABOLIC PANEL - Abnormal; Notable for the following components:      Result Value   Glucose, Bld 132 (*)    Creatinine, Ser 1.37 (*)    GFR, Estimated 42 (*)    All other components within normal limits  RESP PANEL BY RT-PCR (RSV, FLU A&B, COVID)  RVPGX2  CBC  BRAIN NATRIURETIC PEPTIDE  TROPONIN I (HIGH SENSITIVITY)  TROPONIN I (HIGH SENSITIVITY)    EKG EKG Interpretation Date/Time:  Sunday November 10 2022 15:23:56 EDT Ventricular Rate:  90 PR Interval:  174 QRS Duration:  85 QT Interval:  397 QTC Calculation: 486 R Axis:   -10  Text Interpretation: Sinus rhythm Probable anterior infarct, age indeterminate Confirmed by Edwin Dada ([id number]) on 11/10/2022 8:40:32 PM  Radiology DG Chest 2 View  Result Date: 11/10/2022 CLINICAL DATA:  One-week history of shortness of breath and cough EXAM: CHEST - 2 VIEW COMPARISON:  Chest radiograph dated 10/19/2018 FINDINGS: Lung volumes with increased elevation of the right hemidiaphragm. No focal consolidations. No pleural effusion or pneumothorax. The heart size and mediastinal contours are  within normal limits. No acute osseous abnormality. IMPRESSION: Low lung volumes with increased elevation of the right hemidiaphragm. No focal consolidations. Electronically Signed   By: Agustin Cree M.D.   On: 11/10/2022 17:28    Procedures Procedures   Medications Ordered in ED Medications  benzonatate (TESSALON) capsule 100 mg (has no administration in time range)  sodium chloride 0.9 % bolus 500 mL (500 mLs Intravenous New Bag/Given 11/10/22 1735)    ED Course/ Medical Decision Making/ A&P  Medical Decision Making  Amount and/or Complexity of Data Reviewed Labs: ordered. Radiology: ordered.   67 year old female presents to ED for evaluation.  Please see HPI for further details.  On examination patient is afebrile, nontachycardic.  Patient lung sounds are clear bilaterally, she is not hypoxic on room air.  Her abdomen is soft and compressible throughout.  Neurological examination is at baseline.  No edema bilaterally to the lower extremities.  No adventitious lung sounds.  She is overall nontoxic in appearance.  She has a normal sinus rhythm on the monitor, she is currently saturating at 99%.  Patient EKG shows normal sinus rhythm, she denies a history of A-fib.  She has no lower extremity swelling.  Will collect CBC, BMP, BNP, troponin x 2 and, viral panel.  Also collect chest x-ray.  Patient CBC unremarkable without leukocytosis or anemia.  Patient metabolic panel shows creatinine 1.37, 3 months ago her creatinine was 0.80.  Will provide 500 mL of fluid, most recent echocardiogram shows ejection fraction 65 to 70%.  She does not appear volume overloaded.  Her chest x-ray shows no consolidations or effusions or other acute pathology.  Her viral panel is negative for all.  Patient given Tessalon Perle for cough.  EMS EKG reviewed through media tab.  Appears to have normal sinus rhythm with runs of PVC.  No A-fib appreciated.  This was reviewed by my attending Dr. Edwin Dada. Patient  observed in ED for 6 hours and had no runs of a fib on monitor.  The patient EKG shows normal sinus rhythm.  Patient ambulated and she does not desaturate.  Oxygen saturation remain between 96 to 98% on room air.  At this time, the patient be discharged home.  We will refer her to cardiology for further management and workup.  She will also follow-up with her PCP this week.  She was advised to return to the ED with any new or worsening signs or symptoms and she voiced understanding.   Final Clinical Impression(s) / ED Diagnoses Final diagnoses:  Acute cough    Rx / DC Orders ED Discharge Orders          Ordered    benzonatate (TESSALON) 100 MG capsule  Every 8 hours PRN        11/10/22 2045              Al Decant, PA-C 11/10/22 2047    Franne Forts, DO 11/13/22 [phone]

## 2022-11-10 NOTE — Discharge Instructions (Addendum)
It was a pleasure taking part in your care today.  As we discussed, your workup is reassuring.  I would like you to begin taking Tessalon Perles every 8 hours as needed for cough.  I would also like you to follow-up with cardiology regarding possible atrial fibrillation that was appreciated on EKG per EMS.  Please call cardiology office on Monday to make an appointment to be seen.  I would also like you to follow-up this week with your PCP.  Please return to the ED with any new or worsening signs or symptoms.

## 2023-01-02 ENCOUNTER — Ambulatory Visit: Payer: 59 | Admitting: Internal Medicine

## 2023-02-25 ENCOUNTER — Ambulatory Visit: Payer: 59 | Attending: Internal Medicine | Admitting: Internal Medicine

## 2023-02-25 NOTE — Progress Notes (Deleted)
  Cardiology Office Note:  .   Date:  02/25/2023  ID:  [name], DOB 10-23-1955, MRN [id number] PCP: Hillery Aldo, NP  [facility] Providers Cardiologist:  Maisie Fus, MD { Click to update primary MD,subspecialty MD or APP then REFRESH:1}   History of Present Illness: .   [name] is a 67 y.o. female with history of type 2 diabetes, hyperlipidemia, hypertension and history of TIA.  She is sent as a referral for a cough.  She was seen in emergency room in August 2024 with shortness of breath and a cough.  She had an echocardiogram that showed normal LV function, normal global longitudinal strain, normal LV function, no pulmonary hypertension, and no valve disease.  Her BNP was negative.  Her troponin level was negative.  She had a normal cardiac workup.  ROS:  per HPI otherwise negative   Studies Reviewed: .        *** Risk Assessment/Calculations:        Physical Exam:   VS:  There were no vitals taken for this visit.   Wt Readings from Last 3 Encounters:  11/10/22 234 lb (106.1 kg)  10/28/19 (!) 237 lb 11.2 oz (107.8 kg)  10/20/19 234 lb 6.4 oz (106.3 kg)    GEN: Well nourished, well developed in no acute distress NECK: No JVD; No carotid bruits CARDIAC: ***RRR, no murmurs, rubs, gallops RESPIRATORY:  Clear to auscultation without rales, wheezing or rhonchi  ABDOMEN: Soft, non-tender, non-distended EXTREMITIES:  No edema; No deformity   ASSESSMENT AND PLAN: .   Cough She has no signs of congestive heart failure.    {Are you ordering a CV Procedure (e.g. stress test, cath, DCCV, TEE, etc)?   Press F2        :[id number]}  Dispo: ***  Signed, Maisie Fus, MD

## 2023-08-04 ENCOUNTER — Ambulatory Visit (INDEPENDENT_AMBULATORY_CARE_PROVIDER_SITE_OTHER): Admitting: Podiatry

## 2023-08-04 ENCOUNTER — Encounter: Payer: Self-pay | Admitting: Podiatry

## 2023-08-04 DIAGNOSIS — M79675 Pain in left toe(s): Secondary | ICD-10-CM

## 2023-08-04 DIAGNOSIS — B351 Tinea unguium: Secondary | ICD-10-CM

## 2023-08-04 DIAGNOSIS — E1142 Type 2 diabetes mellitus with diabetic polyneuropathy: Secondary | ICD-10-CM | POA: Diagnosis not present

## 2023-08-04 DIAGNOSIS — M79674 Pain in right toe(s): Secondary | ICD-10-CM

## 2023-08-04 NOTE — Progress Notes (Signed)
  Subjective:  Patient ID: [name], female    DOB: [birth year],   MRN: [id number]  Chief Complaint  Patient presents with   Diabetes    Diabetic foot care - last A1c was 6.3, requesting toenail trim and concerned about the discoloration of hallux left - sometimes aches   New Patient (Initial Visit)    68 y.o. female presents for concern of thickened elongated and painful nails that are difficult to trim. Requesting to have them trimmed today. Relates burning and tingling in their feet. Patient is diabetic and last A1c was  Lab Results  Component Value Date   HGBA1C 6.3 (A) 10/28/2019   .   PCP:  Maryellen Snare, NP     . Denies any other pedal complaints. Denies n/v/f/c.   Past Medical History:  Diagnosis Date   Constipation    Essential hypertension    History of pituitary tumor    Hx of bilateral cataracts; s/p extractions    Hyperlipidemia    Internal and external hemorrhoids without complication 12/23/2007   Obesity, morbid, BMI 40.0-49.9 (HCC)    Personal history of colonic polyps 12/2007   most recent colonoscopy 05/2018 (1 adenomatous polyp removed); repeat 2025 per Dr. Howard Macho   Personal history of COVID-19 12/2018   Type 2 diabetes mellitus without complication, with no history of insulin  use (HCC) 2007    Objective:  Physical Exam: Vascular: DP/PT pulses 1/4 bilateral. CFT <3 seconds. Absent hair growth on digits. Edema noted to bilateral lower extremities. Xerosis noted bilaterally.  Skin. No lacerations or abrasions bilateral feet. Nails 1-5 bilateral  are thickened discolored and elongated with subungual debris.  Musculoskeletal: MMT 5/5 bilateral lower extremities in DF, PF, Inversion and Eversion. Deceased ROM in DF of ankle joint.  Neurological: Sensation intact to light touch. Protective sensation diminished bilateral.    Assessment:   1. Pain due to onychomycosis of toenails of both feet   2. Type 2 diabetes mellitus with peripheral neuropathy (HCC)       Plan:  Patient was evaluated and treated and all questions answered. -Discussed and educated patient on diabetic foot care, especially with  regards to the vascular, neurological and musculoskeletal systems.  -Stressed the importance of good glycemic control and the detriment of not  controlling glucose levels in relation to the foot. -Discussed supportive shoes at all times and checking feet regularly.  -Mechanically debrided all nails 1-5 bilateral using sterile nail nipper and filed with dremel without incident  -Answered all patient questions -Patient to return  in 3 months for at risk foot care -Patient advised to call the office if any problems or questions arise in the meantime.   Jennefer Moats, DPM

## 2023-11-04 ENCOUNTER — Ambulatory Visit: Admitting: Podiatry

## 2023-11-18 ENCOUNTER — Encounter: Payer: Self-pay | Admitting: Podiatry

## 2023-11-18 ENCOUNTER — Ambulatory Visit (INDEPENDENT_AMBULATORY_CARE_PROVIDER_SITE_OTHER): Admitting: Podiatry

## 2023-11-18 DIAGNOSIS — E1142 Type 2 diabetes mellitus with diabetic polyneuropathy: Secondary | ICD-10-CM

## 2023-11-18 DIAGNOSIS — M79674 Pain in right toe(s): Secondary | ICD-10-CM

## 2023-11-18 DIAGNOSIS — B351 Tinea unguium: Secondary | ICD-10-CM

## 2023-11-18 DIAGNOSIS — M79675 Pain in left toe(s): Secondary | ICD-10-CM

## 2023-11-18 NOTE — Progress Notes (Signed)
  Subjective:  Patient ID: [name], female    DOB: Nov 13, 1955,   MRN: [id number]  Chief Complaint  Patient presents with   Diabetes    She's checking my toes.  Saw Damian Christians - 3 mos ago; A1c - ?    68 y.o. female presents for concern of thickened elongated and painful nails that are difficult to trim. Requesting to have them trimmed today. Relates burning and tingling in their feet. Patient is diabetic and last A1c was  Lab Results  Component Value Date   HGBA1C 6.3 (A) 10/28/2019   .   PCP:  Christians Damian, NP     . Denies any other pedal complaints. Denies n/v/f/c.   Past Medical History:  Diagnosis Date   Constipation    Essential hypertension    History of pituitary tumor    Hx of bilateral cataracts; s/p extractions    Hyperlipidemia    Internal and external hemorrhoids without complication 12/23/2007   Obesity, morbid, BMI 40.0-49.9 (HCC)    Personal history of colonic polyps 12/2007   most recent colonoscopy 05/2018 (1 adenomatous polyp removed); repeat 2025 per Dr. Teressa   Personal history of COVID-19 12/2018   Type 2 diabetes mellitus without complication, with no history of insulin  use (HCC) 2007    Objective:  Physical Exam: Vascular: DP/PT pulses 1/4 bilateral. CFT <3 seconds. Absent hair growth on digits. Edema noted to bilateral lower extremities. Xerosis noted bilaterally.  Skin. No lacerations or abrasions bilateral feet. Nails 1-5 bilateral  are thickened discolored and elongated with subungual debris.  Musculoskeletal: MMT 5/5 bilateral lower extremities in DF, PF, Inversion and Eversion. Deceased ROM in DF of ankle joint.  Neurological: Sensation intact to light touch. Protective sensation diminished bilateral.    Assessment:   1. Pain due to onychomycosis of toenails of both feet   2. Type 2 diabetes mellitus with peripheral neuropathy (HCC)      Plan:  Patient was evaluated and treated and all questions answered. -Discussed and educated  patient on diabetic foot care, especially with  regards to the vascular, neurological and musculoskeletal systems.  -Stressed the importance of good glycemic control and the detriment of not  controlling glucose levels in relation to the foot. -Discussed supportive shoes at all times and checking feet regularly.  -Mechanically debrided all nails 1-5 bilateral using sterile nail nipper and filed with dremel without incident  -Answered all patient questions -Patient to return  in 3 months for at risk foot care -Patient advised to call the office if any problems or questions arise in the meantime.   Asberry Failing, DPM

## 2024-02-18 ENCOUNTER — Ambulatory Visit: Admitting: Podiatry

## 2024-03-08 ENCOUNTER — Ambulatory Visit: Admitting: Podiatry
